# Patient Record
Sex: Female | Born: 1935 | Race: White | Hispanic: No | Smoking: Never smoker
Health system: Southern US, Community
[De-identification: ages and names within clinical notes are randomized; demographics above are authoritative.]

## PROBLEM LIST (undated history)

## (undated) DIAGNOSIS — M1990 Unspecified osteoarthritis, unspecified site: Secondary | ICD-10-CM

## (undated) DIAGNOSIS — I639 Cerebral infarction, unspecified: Secondary | ICD-10-CM

## (undated) DIAGNOSIS — I10 Essential (primary) hypertension: Secondary | ICD-10-CM

## (undated) DIAGNOSIS — G459 Transient cerebral ischemic attack, unspecified: Secondary | ICD-10-CM

## (undated) DIAGNOSIS — C801 Malignant (primary) neoplasm, unspecified: Secondary | ICD-10-CM

## (undated) DIAGNOSIS — J449 Chronic obstructive pulmonary disease, unspecified: Secondary | ICD-10-CM

## (undated) DIAGNOSIS — K5792 Diverticulitis of intestine, part unspecified, without perforation or abscess without bleeding: Secondary | ICD-10-CM

## (undated) DIAGNOSIS — I2510 Atherosclerotic heart disease of native coronary artery without angina pectoris: Secondary | ICD-10-CM

## (undated) HISTORY — PX: CAROTID STENT: SHX1301

## (undated) HISTORY — PX: ABDOMINAL HYSTERECTOMY: SHX81

## (undated) HISTORY — PX: NASAL SINUS SURGERY: SHX719

## (undated) HISTORY — PX: TONSILLECTOMY: SUR1361

## (undated) HISTORY — PX: REPLACEMENT TOTAL KNEE: SUR1224

## (undated) HISTORY — PX: OVARIAN CYST REMOVAL: SHX89

## (undated) HISTORY — PX: CHOLECYSTECTOMY: SHX55

## (undated) HISTORY — PX: DILATION AND CURETTAGE OF UTERUS: SHX78

## (undated) SURGERY — RIGHT HEART CATH
Anesthesia: Moderate Sedation | Laterality: Right

---

## 2003-07-20 ENCOUNTER — Other Ambulatory Visit: Payer: Self-pay

## 2003-08-31 ENCOUNTER — Other Ambulatory Visit: Payer: Self-pay

## 2003-09-02 ENCOUNTER — Other Ambulatory Visit: Payer: Self-pay

## 2003-09-07 ENCOUNTER — Other Ambulatory Visit: Payer: Self-pay

## 2003-09-09 ENCOUNTER — Other Ambulatory Visit: Payer: Self-pay

## 2003-10-01 ENCOUNTER — Other Ambulatory Visit: Payer: Self-pay

## 2004-03-15 ENCOUNTER — Other Ambulatory Visit: Payer: Self-pay

## 2004-03-27 ENCOUNTER — Inpatient Hospital Stay: Payer: Self-pay | Admitting: Unknown Physician Specialty

## 2004-04-04 ENCOUNTER — Encounter: Payer: Self-pay | Admitting: Internal Medicine

## 2004-04-11 ENCOUNTER — Ambulatory Visit: Payer: Self-pay | Admitting: Internal Medicine

## 2004-05-04 ENCOUNTER — Inpatient Hospital Stay: Payer: Self-pay | Admitting: Internal Medicine

## 2004-05-08 ENCOUNTER — Encounter: Payer: Self-pay | Admitting: Internal Medicine

## 2004-05-09 ENCOUNTER — Emergency Department: Payer: Self-pay | Admitting: Emergency Medicine

## 2004-05-31 ENCOUNTER — Encounter: Payer: Self-pay | Admitting: Internal Medicine

## 2004-12-18 ENCOUNTER — Ambulatory Visit: Payer: Self-pay | Admitting: Unknown Physician Specialty

## 2005-03-12 ENCOUNTER — Ambulatory Visit: Payer: Self-pay | Admitting: Unknown Physician Specialty

## 2005-04-10 ENCOUNTER — Ambulatory Visit: Payer: Self-pay | Admitting: Pain Medicine

## 2005-04-18 ENCOUNTER — Ambulatory Visit: Payer: Self-pay | Admitting: Pain Medicine

## 2005-05-29 ENCOUNTER — Ambulatory Visit: Payer: Self-pay | Admitting: Pain Medicine

## 2005-06-06 ENCOUNTER — Ambulatory Visit: Payer: Self-pay | Admitting: Pain Medicine

## 2005-06-12 ENCOUNTER — Ambulatory Visit: Payer: Self-pay

## 2005-06-26 ENCOUNTER — Ambulatory Visit: Payer: Self-pay | Admitting: Pain Medicine

## 2005-07-02 ENCOUNTER — Ambulatory Visit: Payer: Self-pay | Admitting: Pain Medicine

## 2005-07-19 ENCOUNTER — Ambulatory Visit: Payer: Self-pay | Admitting: Pain Medicine

## 2005-07-25 ENCOUNTER — Ambulatory Visit: Payer: Self-pay | Admitting: Pain Medicine

## 2005-08-21 ENCOUNTER — Ambulatory Visit: Payer: Self-pay | Admitting: Specialist

## 2005-09-06 ENCOUNTER — Ambulatory Visit: Payer: Self-pay | Admitting: Unknown Physician Specialty

## 2005-09-17 ENCOUNTER — Ambulatory Visit: Payer: Self-pay | Admitting: Pain Medicine

## 2005-10-11 ENCOUNTER — Ambulatory Visit: Payer: Self-pay | Admitting: Pain Medicine

## 2005-11-15 ENCOUNTER — Ambulatory Visit: Payer: Self-pay | Admitting: Pain Medicine

## 2005-11-21 ENCOUNTER — Ambulatory Visit: Payer: Self-pay | Admitting: Pain Medicine

## 2005-12-18 ENCOUNTER — Ambulatory Visit: Payer: Self-pay | Admitting: Pain Medicine

## 2006-02-05 ENCOUNTER — Ambulatory Visit: Payer: Self-pay | Admitting: Pain Medicine

## 2006-03-26 ENCOUNTER — Ambulatory Visit: Payer: Self-pay | Admitting: Pain Medicine

## 2006-04-03 ENCOUNTER — Ambulatory Visit: Payer: Self-pay | Admitting: Pain Medicine

## 2006-04-10 ENCOUNTER — Ambulatory Visit: Payer: Self-pay | Admitting: Pain Medicine

## 2006-05-14 ENCOUNTER — Ambulatory Visit: Payer: Self-pay | Admitting: Pain Medicine

## 2006-05-24 ENCOUNTER — Ambulatory Visit: Payer: Self-pay

## 2006-05-29 ENCOUNTER — Ambulatory Visit: Payer: Self-pay | Admitting: Pain Medicine

## 2006-06-13 ENCOUNTER — Ambulatory Visit: Payer: Self-pay | Admitting: Pain Medicine

## 2006-07-11 ENCOUNTER — Ambulatory Visit: Payer: Self-pay | Admitting: Pain Medicine

## 2006-08-08 ENCOUNTER — Ambulatory Visit: Payer: Self-pay | Admitting: Pain Medicine

## 2006-08-21 ENCOUNTER — Ambulatory Visit: Payer: Self-pay | Admitting: Pain Medicine

## 2006-09-05 ENCOUNTER — Ambulatory Visit: Payer: Self-pay | Admitting: Pain Medicine

## 2006-09-16 ENCOUNTER — Ambulatory Visit: Payer: Self-pay | Admitting: Pain Medicine

## 2006-10-17 ENCOUNTER — Ambulatory Visit: Payer: Self-pay | Admitting: Pain Medicine

## 2006-10-28 ENCOUNTER — Ambulatory Visit: Payer: Self-pay | Admitting: Pain Medicine

## 2006-10-31 ENCOUNTER — Ambulatory Visit: Payer: Self-pay | Admitting: Unknown Physician Specialty

## 2006-11-07 ENCOUNTER — Ambulatory Visit: Payer: Self-pay | Admitting: Pain Medicine

## 2006-11-18 ENCOUNTER — Ambulatory Visit: Payer: Self-pay | Admitting: Pain Medicine

## 2006-12-24 ENCOUNTER — Ambulatory Visit: Payer: Self-pay | Admitting: Pain Medicine

## 2007-01-22 ENCOUNTER — Ambulatory Visit: Payer: Self-pay | Admitting: Pain Medicine

## 2007-02-05 ENCOUNTER — Ambulatory Visit: Payer: Self-pay | Admitting: Pain Medicine

## 2007-02-20 ENCOUNTER — Ambulatory Visit: Payer: Self-pay | Admitting: Pain Medicine

## 2007-03-18 ENCOUNTER — Ambulatory Visit: Payer: Self-pay | Admitting: Pain Medicine

## 2007-04-02 ENCOUNTER — Ambulatory Visit: Payer: Self-pay | Admitting: Pain Medicine

## 2007-05-06 ENCOUNTER — Ambulatory Visit: Payer: Self-pay | Admitting: Pain Medicine

## 2007-05-07 ENCOUNTER — Encounter: Admission: RE | Admit: 2007-05-07 | Discharge: 2007-05-07 | Payer: Self-pay | Admitting: Neurology

## 2007-05-21 ENCOUNTER — Ambulatory Visit: Payer: Self-pay | Admitting: Pain Medicine

## 2007-05-26 ENCOUNTER — Other Ambulatory Visit: Payer: Self-pay

## 2007-05-26 ENCOUNTER — Emergency Department: Payer: Self-pay | Admitting: Internal Medicine

## 2007-06-05 ENCOUNTER — Ambulatory Visit: Payer: Self-pay | Admitting: Pain Medicine

## 2007-07-04 ENCOUNTER — Ambulatory Visit: Payer: Self-pay | Admitting: Pain Medicine

## 2007-08-27 ENCOUNTER — Ambulatory Visit: Payer: Self-pay | Admitting: Pain Medicine

## 2007-09-18 ENCOUNTER — Ambulatory Visit: Payer: Self-pay | Admitting: Pain Medicine

## 2007-10-20 ENCOUNTER — Ambulatory Visit: Payer: Self-pay | Admitting: Pain Medicine

## 2007-11-11 ENCOUNTER — Ambulatory Visit: Payer: Self-pay | Admitting: Pain Medicine

## 2007-11-18 ENCOUNTER — Ambulatory Visit: Payer: Self-pay | Admitting: Unknown Physician Specialty

## 2007-11-19 ENCOUNTER — Ambulatory Visit: Payer: Self-pay | Admitting: Pain Medicine

## 2007-11-20 ENCOUNTER — Other Ambulatory Visit: Payer: Self-pay

## 2007-11-20 ENCOUNTER — Inpatient Hospital Stay: Payer: Self-pay | Admitting: Internal Medicine

## 2007-12-16 ENCOUNTER — Ambulatory Visit: Payer: Self-pay | Admitting: Pain Medicine

## 2007-12-31 ENCOUNTER — Ambulatory Visit: Payer: Self-pay | Admitting: Pain Medicine

## 2008-01-22 ENCOUNTER — Ambulatory Visit: Payer: Self-pay | Admitting: Pain Medicine

## 2008-02-23 ENCOUNTER — Ambulatory Visit: Payer: Self-pay | Admitting: Pain Medicine

## 2008-03-18 ENCOUNTER — Ambulatory Visit: Payer: Self-pay | Admitting: Pain Medicine

## 2008-04-15 ENCOUNTER — Ambulatory Visit: Payer: Self-pay | Admitting: Pain Medicine

## 2008-05-26 ENCOUNTER — Ambulatory Visit: Payer: Self-pay | Admitting: Pain Medicine

## 2008-06-23 ENCOUNTER — Ambulatory Visit: Payer: Self-pay | Admitting: Pain Medicine

## 2008-07-20 ENCOUNTER — Ambulatory Visit: Payer: Self-pay | Admitting: Pain Medicine

## 2008-08-12 ENCOUNTER — Ambulatory Visit: Payer: Self-pay | Admitting: Pain Medicine

## 2008-08-18 ENCOUNTER — Ambulatory Visit: Payer: Self-pay | Admitting: Pain Medicine

## 2008-09-09 ENCOUNTER — Ambulatory Visit: Payer: Self-pay | Admitting: Pain Medicine

## 2008-09-13 ENCOUNTER — Ambulatory Visit: Payer: Self-pay | Admitting: Pain Medicine

## 2008-10-19 ENCOUNTER — Ambulatory Visit: Payer: Self-pay | Admitting: Pain Medicine

## 2008-10-25 ENCOUNTER — Ambulatory Visit: Payer: Self-pay | Admitting: Pain Medicine

## 2008-11-16 ENCOUNTER — Ambulatory Visit: Payer: Self-pay | Admitting: Pain Medicine

## 2008-12-28 ENCOUNTER — Ambulatory Visit: Payer: Self-pay | Admitting: Pain Medicine

## 2009-01-26 ENCOUNTER — Ambulatory Visit: Payer: Self-pay | Admitting: Pain Medicine

## 2009-03-02 ENCOUNTER — Ambulatory Visit: Payer: Self-pay | Admitting: Pain Medicine

## 2009-03-30 ENCOUNTER — Ambulatory Visit: Payer: Self-pay | Admitting: Pain Medicine

## 2009-06-02 ENCOUNTER — Ambulatory Visit: Payer: Self-pay | Admitting: Pain Medicine

## 2009-07-12 ENCOUNTER — Ambulatory Visit: Payer: Self-pay | Admitting: Pain Medicine

## 2009-08-15 ENCOUNTER — Ambulatory Visit: Payer: Self-pay | Admitting: Pain Medicine

## 2009-09-27 ENCOUNTER — Ambulatory Visit: Payer: Self-pay | Admitting: Pain Medicine

## 2009-11-22 ENCOUNTER — Ambulatory Visit: Payer: Self-pay | Admitting: Pain Medicine

## 2009-11-23 ENCOUNTER — Ambulatory Visit: Payer: Self-pay | Admitting: Otolaryngology

## 2010-01-18 ENCOUNTER — Ambulatory Visit: Payer: Self-pay | Admitting: Pain Medicine

## 2010-02-28 ENCOUNTER — Ambulatory Visit: Payer: Self-pay | Admitting: Pain Medicine

## 2010-03-12 ENCOUNTER — Emergency Department: Payer: Self-pay | Admitting: Unknown Physician Specialty

## 2010-09-18 ENCOUNTER — Ambulatory Visit: Payer: Self-pay | Admitting: Unknown Physician Specialty

## 2012-06-18 ENCOUNTER — Telehealth: Payer: Self-pay | Admitting: *Deleted

## 2012-06-18 NOTE — Telephone Encounter (Signed)
Refill Request   Metoprolol tartrate 10 m  60 tab  Take 1 tablet twice a day

## 2012-06-18 NOTE — Telephone Encounter (Signed)
I have not ever seen this pt and she has no appt scheduled.  Notify pharmacy to send refill request to her pcp

## 2012-06-20 NOTE — Telephone Encounter (Signed)
I called and spoke with Page at [facility] and advised her patient hadn't established care with Dr. Lorin Picket and this needed to be sent to her PCP. Page asked was she transferring care from Dr. Sampson Goon to Dr. Lorin Picket, I advised Page we had no record on patient to please forward to Dr. Jarrett Ables office.

## 2012-09-12 ENCOUNTER — Telehealth: Payer: Self-pay | Admitting: *Deleted

## 2012-09-12 NOTE — Telephone Encounter (Signed)
Refill Request  Diovan 320 mg  #15  Take 1 tablet by mouth once a day

## 2012-09-12 NOTE — Telephone Encounter (Signed)
Phoned [facility] and advised this pt has not been seen here and to forward request to her PCP. See previous refill note per Dr. Lorin Picket.

## 2013-03-23 ENCOUNTER — Ambulatory Visit: Payer: Self-pay | Admitting: Otolaryngology

## 2013-03-23 LAB — BASIC METABOLIC PANEL
BUN: 22 mg/dL — ABNORMAL HIGH (ref 7–18)
Calcium, Total: 9.1 mg/dL (ref 8.5–10.1)
Co2: 30 mmol/L (ref 21–32)
Creatinine: 0.97 mg/dL (ref 0.60–1.30)
Glucose: 104 mg/dL — ABNORMAL HIGH (ref 65–99)
Osmolality: 266 (ref 275–301)
Potassium: 4.3 mmol/L (ref 3.5–5.1)
Sodium: 131 mmol/L — ABNORMAL LOW (ref 136–145)

## 2013-03-23 LAB — CBC WITH DIFFERENTIAL/PLATELET
Basophil %: 0.8 %
HCT: 41 % (ref 35.0–47.0)
MCHC: 33.7 g/dL (ref 32.0–36.0)
MCV: 85 fL (ref 80–100)
Monocyte #: 0.6 x10 3/mm (ref 0.2–0.9)
Neutrophil #: 5.4 10*3/uL (ref 1.4–6.5)
RDW: 15 % — ABNORMAL HIGH (ref 11.5–14.5)
WBC: 9.3 10*3/uL (ref 3.6–11.0)

## 2013-03-30 ENCOUNTER — Ambulatory Visit: Payer: Self-pay | Admitting: Otolaryngology

## 2013-04-01 LAB — PATHOLOGY REPORT

## 2013-04-20 LAB — CULTURE, FUNGUS WITHOUT SMEAR

## 2014-08-20 NOTE — Op Note (Signed)
PATIENT NAME:  Lisa Haney, Lisa Haney MR#:  [id number] DATE OF BIRTH:  [birth year]  DATE OF PROCEDURE:  03/30/2013  PREOPERATIVE DIAGNOSES: Chronic sinusitis.   POSTOPERATIVE DIAGNOSIS: Chronic sinusitis.   PROCEDURE PERFORMED:  1.  Left sphenoidotomy.  2.  Left total ethmoidectomy.  3.  Left maxillary antrostomy with tissue removal.  4.  Right maxillary antrostomy with tissue removal.  5.  Image guided sinus surgery.   SURGEON: Carloyn Manner, M.D.   ANESTHESIA: General endotracheal anesthesia.   ESTIMATED BLOOD LOSS: 250 mL.   IV FLUIDS: Please see anesthesia record.   COMPLICATIONS: None.   DRAINS/STENT PLACEMENTS: Stammberger Sinu-Foam and XeroGel.   SPECIMENS: Left sinus contents sent for pathological evaluation as well as fungal and aerobic culture.   INDICATIONS FOR PROCEDURE: The patient is an 81 year old female with history of chronic sinusitis, hypertension with long-standing history of sinusitis resistant to medical management. The patient was taken to the operating room for endoscopic sinus surgery.   OPERATIVE FINDINGS: Significant osteitic bone of the left anterior and posterior ethmoid as well as the left sphenoid with a significant amount of debris filling the left sphenoid, posterior ethmoid and purulent drainage in the bilateral maxillary sinuses.   DESCRIPTION OF PROCEDURE: The patient was identified in holding, the benefits and risks of the procedure were discussed and consent was reviewed. The patient was taken to the operating room and placed in the supine position. General endotracheal anesthesia was induced. The patient was rotated 45 degrees. A Stryker image guided sinus system was set up in the normal fashion and calibrated with acceptable error of 0.9 mm. This was referred to throughout the duration of the case to ensure proper positioning of the patient. At this time, the patient was prepped and draped in a sterile fashion. A 0 degree endoscope was brought into  the field. This demonstrated erythematous mucosa lateral to the middle turbinates. The middle turbinates were medialized with a freer elevator Afrin-soaked pledgets were placed for hemostasis. The uncinate process on the right side was brought forward with the ball-tip probe and then a wide maxillary antrostomy was created with a ball-tipped probe and then tissue was removed from the maxillary sinus with the [facility] microdebrider creating a large maxillary antrostomy on the right side.   Purulent drainage and erythematous mucosa was noted in the right maxillary sinus. At this time, hemostasis was achieved using Afrin-soaked pledgets and attention was directed to the patient's left nostril. In a similar fashion, the uncinate process was brought forward using a ball-tipped probe and the maxillary sinus natural os was entered with ball-tipped probe, and then this was brought inferiorly for enlargement of the os. A Diego microdebrider was used to connect the surgical os and the natural os for a wide maxillary antrostomy on the left side. Attention at this time was directed to the patient's ethmoids. The ethmoid bulla was entered using a J-tipped curette given the osteitic nature of it. Then this was debrided with the [facility] microdebrider and a J curette from a medial and inferior position more superior and lateral. This demonstrated erythematous mucosa and thickened secretions and debris. The posterior ethmoid air cells were entered with a J curette and a similar amount of yellowish debris filling the posterior ethmoids was removed. At this time, the left sphenoid was entered. The sphenoid os was enlarged and again, severe osteitic bone was noted and significant amount of debris completely filling the sphenoid sinus was removed. At this time, the patient's bilateral nasal cavities were copiously  irrigated with sterile saline until all debris was completely removed. Stammberger Sinu-Foam was placed within the sphenoid  sinus as well as the maxillary sinus and left ethmoid and right maxillary sinus and then XeroGel was placed lateral to the middle turbinates for medialization and this was inflated with sterile saline. At this time, care of the patient was transferred to anesthesia.   ____________________________ Jerene Bears, MD ccv:aw D: 03/30/2013 08:32:21 ET T: 03/30/2013 08:47:17 ET JOB#: [id number]  cc: Jerene Bears, MD, <Dictator> Jerene Bears MD ELECTRONICALLY SIGNED 04/06/2013 10:31

## 2015-10-17 ENCOUNTER — Other Ambulatory Visit: Payer: Self-pay | Admitting: Student

## 2015-10-17 DIAGNOSIS — R1084 Generalized abdominal pain: Secondary | ICD-10-CM

## 2015-10-19 ENCOUNTER — Other Ambulatory Visit: Payer: Self-pay | Admitting: Student

## 2015-10-19 ENCOUNTER — Ambulatory Visit
Admission: RE | Admit: 2015-10-19 | Discharge: 2015-10-19 | Disposition: A | Discharge status: Home / Self Care | Payer: Medicare Other | Source: Ambulatory Visit | Attending: Student | Admitting: Student

## 2015-10-19 DIAGNOSIS — R1084 Generalized abdominal pain: Secondary | ICD-10-CM | POA: Diagnosis present

## 2015-10-19 HISTORY — DX: Malignant (primary) neoplasm, unspecified: C80.1

## 2015-10-19 HISTORY — DX: Essential (primary) hypertension: I10

## 2015-10-19 MED ORDER — IOPAMIDOL (ISOVUE-300) INJECTION 61%
80.0000 mL | Freq: Once | INTRAVENOUS | Status: AC | PRN
  Administered 2015-10-19: 80 mL via INTRAVENOUS

## 2016-01-26 ENCOUNTER — Other Ambulatory Visit: Payer: Self-pay | Admitting: Internal Medicine

## 2016-01-26 DIAGNOSIS — G8929 Other chronic pain: Secondary | ICD-10-CM

## 2016-01-26 DIAGNOSIS — M5441 Lumbago with sciatica, right side: Secondary | ICD-10-CM

## 2016-01-26 DIAGNOSIS — M5137 Other intervertebral disc degeneration, lumbosacral region: Secondary | ICD-10-CM

## 2016-01-26 DIAGNOSIS — M5442 Lumbago with sciatica, left side: Secondary | ICD-10-CM

## 2016-02-03 ENCOUNTER — Ambulatory Visit
Admission: RE | Admit: 2016-02-03 | Discharge: 2016-02-03 | Disposition: A | Discharge status: Home / Self Care | Payer: Medicare Other | Source: Ambulatory Visit | Attending: Infectious Diseases | Admitting: Infectious Diseases

## 2016-02-03 ENCOUNTER — Ambulatory Visit
Admission: RE | Admit: 2016-02-03 | Discharge: 2016-02-03 | Disposition: A | Discharge status: Home / Self Care | Payer: Medicare Other | Source: Ambulatory Visit | Attending: Internal Medicine | Admitting: Internal Medicine

## 2016-02-03 DIAGNOSIS — I517 Cardiomegaly: Secondary | ICD-10-CM | POA: Insufficient documentation

## 2016-02-03 DIAGNOSIS — I700 Atherosclerosis of aorta: Secondary | ICD-10-CM | POA: Insufficient documentation

## 2016-02-03 DIAGNOSIS — Z452 Encounter for adjustment and management of vascular access device: Secondary | ICD-10-CM | POA: Diagnosis not present

## 2016-02-03 DIAGNOSIS — R7881 Bacteremia: Secondary | ICD-10-CM | POA: Diagnosis not present

## 2016-02-03 MED ORDER — VANCOMYCIN HCL IN DEXTROSE 1-5 GM/200ML-% IV SOLN
1000.0000 mg | Freq: Once | INTRAVENOUS | Status: AC
  Administered 2016-02-03: 1000 mg via INTRAVENOUS

## 2016-02-03 MED ORDER — VANCOMYCIN HCL IN DEXTROSE 1-5 GM/200ML-% IV SOLN
INTRAVENOUS | Status: AC
  Filled 2016-02-03: qty 200

## 2016-02-03 MED ORDER — SODIUM CHLORIDE FLUSH 0.9 % IV SOLN
INTRAVENOUS | Status: AC
  Administered 2016-02-03: 18:00:00
  Filled 2016-02-03: qty 20

## 2016-02-03 NOTE — Progress Notes (Signed)
Dr. Ola Spurr paged x 2 to obtain picc line flushing instructions. No return call. Picc line flushed with sodium chloride flush.

## 2016-02-08 ENCOUNTER — Ambulatory Visit: Payer: Medicare Other

## 2016-02-12 ENCOUNTER — Inpatient Hospital Stay
Admission: EM | Admit: 2016-02-12 | Discharge: 2016-02-15 | DRG: 190 | Disposition: A | Discharge status: Home Health | Payer: Medicare Other | Attending: Internal Medicine | Admitting: Internal Medicine

## 2016-02-12 ENCOUNTER — Emergency Department: Payer: Medicare Other

## 2016-02-12 ENCOUNTER — Encounter: Payer: Self-pay | Admitting: Emergency Medicine

## 2016-02-12 DIAGNOSIS — J328 Other chronic sinusitis: Secondary | ICD-10-CM | POA: Diagnosis present

## 2016-02-12 DIAGNOSIS — I110 Hypertensive heart disease with heart failure: Secondary | ICD-10-CM | POA: Diagnosis present

## 2016-02-12 DIAGNOSIS — R601 Generalized edema: Secondary | ICD-10-CM

## 2016-02-12 DIAGNOSIS — Z79899 Other long term (current) drug therapy: Secondary | ICD-10-CM

## 2016-02-12 DIAGNOSIS — J441 Chronic obstructive pulmonary disease with (acute) exacerbation: Principal | ICD-10-CM | POA: Diagnosis present

## 2016-02-12 DIAGNOSIS — Y712 Prosthetic and other implants, materials and accessory cardiovascular devices associated with adverse incidents: Secondary | ICD-10-CM | POA: Diagnosis present

## 2016-02-12 DIAGNOSIS — R0902 Hypoxemia: Secondary | ICD-10-CM

## 2016-02-12 DIAGNOSIS — I472 Ventricular tachycardia: Secondary | ICD-10-CM | POA: Diagnosis not present

## 2016-02-12 DIAGNOSIS — J449 Chronic obstructive pulmonary disease, unspecified: Secondary | ICD-10-CM

## 2016-02-12 DIAGNOSIS — Z6841 Body Mass Index (BMI) 40.0 and over, adult: Secondary | ICD-10-CM

## 2016-02-12 DIAGNOSIS — J9621 Acute and chronic respiratory failure with hypoxia: Secondary | ICD-10-CM | POA: Diagnosis present

## 2016-02-12 DIAGNOSIS — E785 Hyperlipidemia, unspecified: Secondary | ICD-10-CM | POA: Diagnosis present

## 2016-02-12 DIAGNOSIS — B9562 Methicillin resistant Staphylococcus aureus infection as the cause of diseases classified elsewhere: Secondary | ICD-10-CM | POA: Diagnosis present

## 2016-02-12 DIAGNOSIS — R0602 Shortness of breath: Secondary | ICD-10-CM | POA: Diagnosis not present

## 2016-02-12 DIAGNOSIS — I5033 Acute on chronic diastolic (congestive) heart failure: Secondary | ICD-10-CM | POA: Diagnosis present

## 2016-02-12 DIAGNOSIS — Z8673 Personal history of transient ischemic attack (TIA), and cerebral infarction without residual deficits: Secondary | ICD-10-CM

## 2016-02-12 DIAGNOSIS — E039 Hypothyroidism, unspecified: Secondary | ICD-10-CM | POA: Diagnosis present

## 2016-02-12 DIAGNOSIS — Z87891 Personal history of nicotine dependence: Secondary | ICD-10-CM

## 2016-02-12 DIAGNOSIS — Z95828 Presence of other vascular implants and grafts: Secondary | ICD-10-CM

## 2016-02-12 DIAGNOSIS — Z9981 Dependence on supplemental oxygen: Secondary | ICD-10-CM

## 2016-02-12 DIAGNOSIS — Z9049 Acquired absence of other specified parts of digestive tract: Secondary | ICD-10-CM

## 2016-02-12 DIAGNOSIS — R262 Difficulty in walking, not elsewhere classified: Secondary | ICD-10-CM

## 2016-02-12 DIAGNOSIS — D72829 Elevated white blood cell count, unspecified: Secondary | ICD-10-CM

## 2016-02-12 DIAGNOSIS — T82524A Displacement of infusion catheter, initial encounter: Secondary | ICD-10-CM | POA: Diagnosis present

## 2016-02-12 DIAGNOSIS — M6281 Muscle weakness (generalized): Secondary | ICD-10-CM

## 2016-02-12 DIAGNOSIS — Z96651 Presence of right artificial knee joint: Secondary | ICD-10-CM | POA: Diagnosis present

## 2016-02-12 DIAGNOSIS — Z452 Encounter for adjustment and management of vascular access device: Secondary | ICD-10-CM

## 2016-02-12 HISTORY — DX: Diverticulitis of intestine, part unspecified, without perforation or abscess without bleeding: K57.92

## 2016-02-12 HISTORY — DX: Transient cerebral ischemic attack, unspecified: G45.9

## 2016-02-12 HISTORY — DX: Chronic obstructive pulmonary disease, unspecified: J44.9

## 2016-02-12 MED ORDER — ACETAMINOPHEN 325 MG PO TABS
650.0000 mg | ORAL_TABLET | Freq: Once | ORAL | Status: AC
  Administered 2016-02-12: 650 mg via ORAL
  Filled 2016-02-12: qty 2

## 2016-02-12 MED ORDER — IPRATROPIUM-ALBUTEROL 0.5-2.5 (3) MG/3ML IN SOLN
RESPIRATORY_TRACT | Status: AC
  Filled 2016-02-12: qty 3

## 2016-02-12 MED ORDER — METHYLPREDNISOLONE SODIUM SUCC 125 MG IJ SOLR
125.0000 mg | Freq: Once | INTRAMUSCULAR | Status: AC
  Administered 2016-02-13: 125 mg via INTRAVENOUS
  Filled 2016-02-12: qty 2

## 2016-02-12 MED ORDER — VANCOMYCIN HCL IN DEXTROSE 1-5 GM/200ML-% IV SOLN
1000.0000 mg | Freq: Once | INTRAVENOUS | Status: AC
  Administered 2016-02-12: 1000 mg via INTRAVENOUS
  Filled 2016-02-12: qty 200

## 2016-02-12 MED ORDER — PRAVASTATIN SODIUM 40 MG PO TABS
40.0000 mg | ORAL_TABLET | Freq: Once | ORAL | Status: AC
  Administered 2016-02-13: 40 mg via ORAL
  Filled 2016-02-12: qty 1

## 2016-02-12 MED ORDER — ASPIRIN EC 81 MG PO TBEC
81.0000 mg | DELAYED_RELEASE_TABLET | Freq: Once | ORAL | Status: AC
  Administered 2016-02-12: 81 mg via ORAL
  Filled 2016-02-12: qty 1

## 2016-02-12 MED ORDER — ISOSORBIDE DINITRATE 20 MG PO TABS
20.0000 mg | ORAL_TABLET | Freq: Once | ORAL | Status: AC
  Administered 2016-02-13: 20 mg via ORAL
  Filled 2016-02-12: qty 1

## 2016-02-12 MED ORDER — TRAMADOL HCL 50 MG PO TABS
50.0000 mg | ORAL_TABLET | Freq: Once | ORAL | Status: AC
  Administered 2016-02-12: 50 mg via ORAL
  Filled 2016-02-12: qty 1

## 2016-02-12 MED ORDER — CLONIDINE HCL 0.1 MG PO TABS
0.2000 mg | ORAL_TABLET | Freq: Once | ORAL | Status: AC
  Administered 2016-02-12: 0.2 mg via ORAL
  Filled 2016-02-12: qty 2

## 2016-02-12 MED ORDER — METOPROLOL TARTRATE 50 MG PO TABS
100.0000 mg | ORAL_TABLET | Freq: Once | ORAL | Status: AC
  Administered 2016-02-12: 100 mg via ORAL
  Filled 2016-02-12: qty 2

## 2016-02-12 MED ORDER — IPRATROPIUM-ALBUTEROL 0.5-2.5 (3) MG/3ML IN SOLN
3.0000 mL | Freq: Once | RESPIRATORY_TRACT | Status: AC
  Administered 2016-02-12: 3 mL via RESPIRATORY_TRACT

## 2016-02-12 NOTE — ED Notes (Signed)
Report given to Laurie, RN.

## 2016-02-12 NOTE — ED Triage Notes (Addendum)
Pt presents with PICC line to right upper arm that was placed her at [facility] about 1 week ago; pt says around 3pm today, when she was supposed to have her afternoon antibiotics, she noticed the line hanging out; pt c/o general pain to upper arm since line placed, no new or increased pain; pt with history of COPD but denies any worsening shortness of breath since line came out; tip of catheter does not appear to be intact; markings on line to indicate placement show one line present at the very end of the catheter with a straight cut mark across the end of the tube;

## 2016-02-12 NOTE — ED Notes (Signed)
Spoke with Dr Kerman Passey, verbal order for chest xray

## 2016-02-12 NOTE — ED Provider Notes (Signed)
-----------------------------------------   11:53 PM on 02/12/2016 -----------------------------------------  Called to patient's bedside who is experiencing shortness of breath. On 2L oxygen sats are 85%. Diffuse wheezing on auscultation. Will initiate IV solumedrol, duoneb, check EKG, basic labs.   ----------------------------------------- 12:29 AM on 02/13/2016 -----------------------------------------  Wheezing improved. Patient is currently on her own CPAP; sats 88%. Will add VBG.  ED ECG REPORT I, Capria Cartaya J, the attending physician, personally viewed and interpreted this ECG.   Date: 02/13/2016  EKG Time: 0019  Rate: 89  Rhythm: normal EKG, normal sinus rhythm  Axis: Normal  Intervals:none  ST&T Change: Nonspecific  ----------------------------------------- 2:18 AM on 02/13/2016 -----------------------------------------  Oxygen increased to 4 L, saturations 92%. Slight wheezing returned. Will administer second DuoNeb. Will discuss with hospitalist to evaluate patient in the emergency department for admission.   Paulette Blanch, MD 02/13/16 [phone]

## 2016-02-12 NOTE — ED Notes (Signed)
Dr. Paduchowski at bedside.  

## 2016-02-12 NOTE — ED Provider Notes (Signed)
[facility] Emergency Department Provider Note  Time seen: 9:57 PM  I have reviewed the triage vital signs and the nursing notes.   HISTORY  Chief Complaint Vascular Access Problem    HPI Lisa Haney is a 82 y.o. female presents to the emergency department for PICC line displacement. According to the patient she does not know what happened but she noticed that her PICC line is pulled out of her right arm. Patient is currently on PICC line antibiotics for daily vancomycin due to recurrent ear and sinus infections that have cultured positive for MRSA. Dr. Ola Spurr is the patient's treating physician. A PICC line was placed 02/03/16. Patient did not receive her daily dose of vancomycin today. Patient has no complaints besides hoping to get the PICC line replaced. No medical complaints at this time.  Past Medical History:  Diagnosis Date  . Cancer ([place])   . COPD (chronic obstructive pulmonary disease) ([place])   . Diverticulitis   . Hypertension   . TIA (transient ischemic attack)     There are no active problems to display for this patient.   Past Surgical History:  Procedure Laterality Date  . ABDOMINAL HYSTERECTOMY    . CAROTID STENT    . CHOLECYSTECTOMY    . DILATION AND CURETTAGE OF UTERUS    . NASAL SINUS SURGERY    . OVARIAN CYST REMOVAL    . REPLACEMENT TOTAL KNEE Right   . TONSILLECTOMY      Prior to Admission medications   Medication Sig Start Date End Date Taking? Authorizing Provider  acetaminophen (TYLENOL) 650 MG CR tablet Take 650 mg by mouth every 8 (eight) hours as needed for pain.    Historical Provider, MD  albuterol (PROVENTIL HFA;VENTOLIN HFA) 108 (90 Base) MCG/ACT inhaler Inhale 2 puffs into the lungs every 6 (six) hours as needed for wheezing or shortness of breath.    Historical Provider, MD  amLODipine (NORVASC) 5 MG tablet Take 5 mg by mouth daily.    Historical Provider, MD  Cholecalciferol 1000 UNIT/10ML LIQD Take 1,000  Units by mouth daily.    Historical Provider, MD  cloNIDine (CATAPRES) 0.2 MG tablet Take 0.2 mg by mouth daily.    Historical Provider, MD  cyclobenzaprine (FLEXERIL) 5 MG tablet Take 5 mg by mouth daily.    Historical Provider, MD  furosemide (LASIX) 20 MG tablet Take 60 mg by mouth daily.    Historical Provider, MD  isosorbide dinitrate (ISORDIL) 20 MG tablet Take 20 mg by mouth 3 (three) times daily.    Historical Provider, MD  levothyroxine (SYNTHROID, LEVOTHROID) 150 MCG tablet Take 150 mcg by mouth daily before breakfast. Take on an empty stomach 30 to 60 minutes before breakfast    Historical Provider, MD  lisinopril (PRINIVIL,ZESTRIL) 20 MG tablet Take 20 mg by mouth daily.    Historical Provider, MD  metoprolol (LOPRESSOR) 100 MG tablet Take 100 mg by mouth daily.    Historical Provider, MD  neomycin-polymyxin-dexamethasone (MAXITROL) 0.1 % ophthalmic suspension Place into both eyes 2 (two) times daily.    Historical Provider, MD  pravastatin (PRAVACHOL) 40 MG tablet Take 40 mg by mouth at bedtime.    Historical Provider, MD  traMADol (ULTRAM) 50 MG tablet Take 50 mg by mouth every 8 (eight) hours as needed. Take one to two tablets 3 times a day as needed for pain    Historical Provider, MD  traMADol-acetaminophen (ULTRACET) 37.5-325 MG tablet Take 1 tablet by mouth every 8 (  eight) hours as needed for severe pain.    Historical Provider, MD    Allergies  Allergen Reactions  . Dioxyline Nausea Only    Severe upset stomach with pain    History reviewed. No pertinent family history.  Social History Social History  Substance Use Topics  . Smoking status: Former Research scientist (life sciences)  . Smokeless tobacco: Never Used  . Alcohol use No    Review of Systems Constitutional: Negative for fever Cardiovascular: Negative for chest pain. Respiratory: Negative for shortness of breath. Gastrointestinal: Negative for abdominal pain Neurological: Negative for headache 10-point ROS otherwise  negative.  ____________________________________________   PHYSICAL EXAM:  VITAL SIGNS: ED Triage Vitals  Enc Vitals Group     BP 02/12/16 2018 (!) 172/50     Pulse Rate 02/12/16 2018 69     Resp 02/12/16 2018 18     Temp 02/12/16 2018 97.5 F (36.4 C)     Temp Source 02/12/16 2018 Oral     SpO2 02/12/16 2018 95 %     Weight 02/12/16 2019 214 lb (97.1 kg)     Height 02/12/16 2019 5\' 1"  (1.549 m)     Head Circumference --      Peak Flow --      Pain Score 02/12/16 2021 1     Pain Loc --      Pain Edu? --      Excl. in [place]? --     Constitutional: Alert and oriented. Well appearing and in no distress. Eyes: Normal exam ENT   Head: Normocephalic and atraumatic   Mouth/Throat: Mucous membranes are moist. Cardiovascular: Normal rate, regular rhythm. No murmur Respiratory: Normal respiratory effort without tachypnea nor retractions. Breath sounds are clear Gastrointestinal: Soft and nontender. No distention. Musculoskeletal: Nontender with normal range of motion in all extremities. PICC line displacement from right upper extremity. Neurologic:  Normal speech and language. No gross focal neurologic deficits Skin:  Skin is warm, dry and intact.  Psychiatric: Mood and affect are normal.  ____________________________________________   RADIOLOGY  X-ray shows PICC line has been removed, no foreign body.  ____________________________________________   INITIAL IMPRESSION / ASSESSMENT AND PLAN / ED COURSE  Pertinent labs & imaging results that were available during my care of the patient were reviewed by me and considered in my medical decision making (see chart for details).  Patient presents the emergency department with a displaced PICC line from her right upper extremity. Patient is currently receiving daily IV vancomycin for recurrent/chronic ear and sinus infections that have cultured MRSA positive. Patient received her dose yesterday. We'll place an IV and dose the  patient's nighttime dose of IV vancomycin. I discussed the patient with vascular access, they do not have anyone available tonight to place the PICC line. They state the patient can return to the emergency department tomorrow and they could have someone come in to replace the PICC line. We will have the patient discussed this with Dr. Ola Spurr first thing in the morning to see if there is a better means for the patient to have her PICC line replaced, if no better means are available the patient will return to the emergency department tomorrow to have the PICC line replaced.  Son states that he do not know how they would get the patient back to the emergency department so early in the morning, patient states she rather just stay and have placed in the morning. Patient will stay in the emergency department overnight, we'll have her PICC line  replaced tomorrow once the PICC team arrives.  ____________________________________________   FINAL CLINICAL IMPRESSION(S) / ED DIAGNOSES  PICC line disruption/displacement    Harvest Dark, MD 02/12/16 2239

## 2016-02-12 NOTE — ED Notes (Signed)
Dr Beather Arbour at bedside to assess

## 2016-02-12 NOTE — ED Notes (Signed)
MD to bedside at this time. Pt txfr to hospital bed for comfort, pt then c/o being [facility] and her eyes noted to cross. Pt c/o HA at this time due to not having her pain pill. MD made aware of patient complaints. MD aware of patient's continued elevated BP at this time. Per MD okay to check BP q 4 hrs. Will continue to monitor.

## 2016-02-13 ENCOUNTER — Inpatient Hospital Stay: Payer: Medicare Other

## 2016-02-13 DIAGNOSIS — Z79899 Other long term (current) drug therapy: Secondary | ICD-10-CM | POA: Diagnosis not present

## 2016-02-13 DIAGNOSIS — Z9049 Acquired absence of other specified parts of digestive tract: Secondary | ICD-10-CM | POA: Diagnosis not present

## 2016-02-13 DIAGNOSIS — J9621 Acute and chronic respiratory failure with hypoxia: Secondary | ICD-10-CM | POA: Diagnosis present

## 2016-02-13 DIAGNOSIS — I472 Ventricular tachycardia: Secondary | ICD-10-CM | POA: Diagnosis not present

## 2016-02-13 DIAGNOSIS — Z8673 Personal history of transient ischemic attack (TIA), and cerebral infarction without residual deficits: Secondary | ICD-10-CM | POA: Diagnosis not present

## 2016-02-13 DIAGNOSIS — E039 Hypothyroidism, unspecified: Secondary | ICD-10-CM | POA: Diagnosis present

## 2016-02-13 DIAGNOSIS — E785 Hyperlipidemia, unspecified: Secondary | ICD-10-CM | POA: Diagnosis present

## 2016-02-13 DIAGNOSIS — Z96651 Presence of right artificial knee joint: Secondary | ICD-10-CM | POA: Diagnosis present

## 2016-02-13 DIAGNOSIS — Z6841 Body Mass Index (BMI) 40.0 and over, adult: Secondary | ICD-10-CM | POA: Diagnosis not present

## 2016-02-13 DIAGNOSIS — Y712 Prosthetic and other implants, materials and accessory cardiovascular devices associated with adverse incidents: Secondary | ICD-10-CM | POA: Diagnosis present

## 2016-02-13 DIAGNOSIS — I110 Hypertensive heart disease with heart failure: Secondary | ICD-10-CM | POA: Diagnosis present

## 2016-02-13 DIAGNOSIS — D72829 Elevated white blood cell count, unspecified: Secondary | ICD-10-CM | POA: Diagnosis present

## 2016-02-13 DIAGNOSIS — I5033 Acute on chronic diastolic (congestive) heart failure: Secondary | ICD-10-CM | POA: Diagnosis present

## 2016-02-13 DIAGNOSIS — J449 Chronic obstructive pulmonary disease, unspecified: Secondary | ICD-10-CM | POA: Diagnosis present

## 2016-02-13 DIAGNOSIS — B9562 Methicillin resistant Staphylococcus aureus infection as the cause of diseases classified elsewhere: Secondary | ICD-10-CM | POA: Diagnosis present

## 2016-02-13 DIAGNOSIS — T82524A Displacement of infusion catheter, initial encounter: Secondary | ICD-10-CM | POA: Diagnosis present

## 2016-02-13 DIAGNOSIS — J441 Chronic obstructive pulmonary disease with (acute) exacerbation: Secondary | ICD-10-CM | POA: Diagnosis present

## 2016-02-13 DIAGNOSIS — Z87891 Personal history of nicotine dependence: Secondary | ICD-10-CM | POA: Diagnosis not present

## 2016-02-13 DIAGNOSIS — Z9981 Dependence on supplemental oxygen: Secondary | ICD-10-CM | POA: Diagnosis not present

## 2016-02-13 DIAGNOSIS — J328 Other chronic sinusitis: Secondary | ICD-10-CM | POA: Diagnosis present

## 2016-02-13 DIAGNOSIS — R0602 Shortness of breath: Secondary | ICD-10-CM | POA: Diagnosis present

## 2016-02-13 LAB — BLOOD GAS, VENOUS
ACID-BASE EXCESS: 0 mmol/L (ref 0.0–2.0)
BICARBONATE: 25.4 mmol/L (ref 20.0–28.0)
O2 Saturation: 64 %
PATIENT TEMPERATURE: 37
PH VEN: 7.38 (ref 7.250–7.430)
PO2 VEN: 34 mmHg (ref 32.0–45.0)
pCO2, Ven: 43 mmHg — ABNORMAL LOW (ref 44.0–60.0)

## 2016-02-13 LAB — CBC WITH DIFFERENTIAL/PLATELET
Basophils Absolute: 0.1 10*3/uL (ref 0–0.1)
Basophils Relative: 1 %
Eosinophils Absolute: 0.2 10*3/uL (ref 0–0.7)
Eosinophils Relative: 1 %
HEMATOCRIT: 47.3 % — AB (ref 35.0–47.0)
HEMOGLOBIN: 15.8 g/dL (ref 12.0–16.0)
LYMPHS ABS: 1.4 10*3/uL (ref 1.0–3.6)
LYMPHS PCT: 8 %
MCH: 28.4 pg (ref 26.0–34.0)
MCHC: 33.4 g/dL (ref 32.0–36.0)
MCV: 85.2 fL (ref 80.0–100.0)
MONO ABS: 0.8 10*3/uL (ref 0.2–0.9)
MONOS PCT: 4 %
NEUTROS ABS: 16.3 10*3/uL — AB (ref 1.4–6.5)
Neutrophils Relative %: 86 %
Platelets: 151 10*3/uL (ref 150–440)
RBC: 5.55 MIL/uL — ABNORMAL HIGH (ref 3.80–5.20)
RDW: 15.5 % — AB (ref 11.5–14.5)
WBC: 18.9 10*3/uL — ABNORMAL HIGH (ref 3.6–11.0)

## 2016-02-13 LAB — BASIC METABOLIC PANEL
Anion gap: 8 (ref 5–15)
BUN: 16 mg/dL (ref 6–20)
CALCIUM: 8.9 mg/dL (ref 8.9–10.3)
CHLORIDE: 101 mmol/L (ref 101–111)
CO2: 25 mmol/L (ref 22–32)
CREATININE: 0.88 mg/dL (ref 0.44–1.00)
GFR calc Af Amer: >60 mL/min (ref >60)
GFR calc non Af Amer: 58 mL/min — ABNORMAL LOW (ref >60)
GLUCOSE: 135 mg/dL — AB (ref 65–99)
Potassium: 3.5 mmol/L (ref 3.5–5.1)
Sodium: 134 mmol/L — ABNORMAL LOW (ref 135–145)

## 2016-02-13 LAB — LACTIC ACID, PLASMA: Lactic Acid, Venous: 1.3 mmol/L (ref 0.5–1.9)

## 2016-02-13 LAB — MAGNESIUM: Magnesium: 1.7 mg/dL (ref 1.7–2.4)

## 2016-02-13 LAB — PHOSPHORUS: PHOSPHORUS: 2.9 mg/dL (ref 2.5–4.6)

## 2016-02-13 LAB — TROPONIN I: Troponin I: <0.03 ng/mL (ref <0.03)

## 2016-02-13 LAB — BRAIN NATRIURETIC PEPTIDE: B Natriuretic Peptide: 381 pg/mL — ABNORMAL HIGH (ref 0.0–100.0)

## 2016-02-13 LAB — VANCOMYCIN, RANDOM: VANCOMYCIN RM: 16

## 2016-02-13 LAB — MRSA PCR SCREENING: MRSA BY PCR: POSITIVE — AB

## 2016-02-13 MED ORDER — NEOMYCIN-POLYMYXIN-DEXAMETH 0.1 % OP SUSP: 1.0000 [drp] | Freq: Two times a day (BID) | OPHTHALMIC | Status: DC

## 2016-02-13 MED ORDER — IPRATROPIUM-ALBUTEROL 0.5-2.5 (3) MG/3ML IN SOLN: 3.0000 mL | RESPIRATORY_TRACT | Status: DC | PRN

## 2016-02-13 MED ORDER — IPRATROPIUM-ALBUTEROL 0.5-2.5 (3) MG/3ML IN SOLN
3.0000 mL | Freq: Once | RESPIRATORY_TRACT | Status: AC
  Administered 2016-02-13: 3 mL via RESPIRATORY_TRACT
  Filled 2016-02-13: qty 3

## 2016-02-13 MED ORDER — AMLODIPINE BESYLATE 5 MG PO TABS
5.0000 mg | ORAL_TABLET | Freq: Every day | ORAL | Status: DC
  Administered 2016-02-13 – 2016-02-15 (×3): 5 mg via ORAL
  Filled 2016-02-13 (×3): qty 1

## 2016-02-13 MED ORDER — CYCLOBENZAPRINE HCL 10 MG PO TABS
5.0000 mg | ORAL_TABLET | Freq: Every day | ORAL | Status: DC
  Administered 2016-02-13 – 2016-02-15 (×3): 5 mg via ORAL
  Filled 2016-02-13 (×3): qty 1

## 2016-02-13 MED ORDER — PIPERACILLIN-TAZOBACTAM 4.5 G IVPB
4.5000 g | Freq: Three times a day (TID) | INTRAVENOUS | Status: DC
  Administered 2016-02-13: 05:00:00 4.5 g via INTRAVENOUS
  Filled 2016-02-13 (×3): qty 100

## 2016-02-13 MED ORDER — CLONIDINE HCL 0.1 MG PO TABS
0.2000 mg | ORAL_TABLET | Freq: Every day | ORAL | Status: DC
  Administered 2016-02-13 – 2016-02-15 (×3): 0.2 mg via ORAL
  Filled 2016-02-13 (×3): qty 2

## 2016-02-13 MED ORDER — TRAMADOL HCL 50 MG PO TABS
50.0000 mg | ORAL_TABLET | Freq: Three times a day (TID) | ORAL | Status: DC | PRN
  Administered 2016-02-14: 21:00:00 50 mg via ORAL
  Filled 2016-02-13: qty 1

## 2016-02-13 MED ORDER — VANCOMYCIN HCL IN DEXTROSE 1-5 GM/200ML-% IV SOLN
1000.0000 mg | INTRAVENOUS | Status: DC
  Administered 2016-02-13 – 2016-02-14 (×2): 1000 mg via INTRAVENOUS
  Filled 2016-02-13 (×3): qty 200

## 2016-02-13 MED ORDER — METHYLPREDNISOLONE SODIUM SUCC 125 MG IJ SOLR
60.0000 mg | Freq: Four times a day (QID) | INTRAMUSCULAR | Status: DC
  Administered 2016-02-13: 05:00:00 60 mg via INTRAVENOUS
  Filled 2016-02-13: qty 2

## 2016-02-13 MED ORDER — LISINOPRIL 20 MG PO TABS
20.0000 mg | ORAL_TABLET | Freq: Every day | ORAL | Status: DC
  Administered 2016-02-13 – 2016-02-15 (×3): 20 mg via ORAL
  Filled 2016-02-13 (×3): qty 1

## 2016-02-13 MED ORDER — CHLORHEXIDINE GLUCONATE CLOTH 2 % EX PADS
6.0000 | MEDICATED_PAD | Freq: Every day | CUTANEOUS | Status: DC
  Administered 2016-02-13 – 2016-02-14 (×2): 6 via TOPICAL

## 2016-02-13 MED ORDER — SODIUM CHLORIDE 0.9 % IV SOLN
INTRAVENOUS | Status: DC
  Administered 2016-02-13: 05:00:00 via INTRAVENOUS

## 2016-02-13 MED ORDER — ENOXAPARIN SODIUM 40 MG/0.4ML ~~LOC~~ SOLN
40.0000 mg | Freq: Two times a day (BID) | SUBCUTANEOUS | Status: DC
  Administered 2016-02-13 – 2016-02-15 (×5): 40 mg via SUBCUTANEOUS
  Filled 2016-02-13 (×5): qty 0.4

## 2016-02-13 MED ORDER — CHOLECALCIFEROL 400 UNIT/ML PO LIQD
1000.0000 [IU] | Freq: Every day | ORAL | Status: DC
  Administered 2016-02-13 – 2016-02-15 (×3): 1000 [IU] via ORAL
  Filled 2016-02-13 (×4): qty 2.5

## 2016-02-13 MED ORDER — ACETAMINOPHEN 650 MG RE SUPP
650.0000 mg | Freq: Four times a day (QID) | RECTAL | Status: DC | PRN
  Filled 2016-02-13: qty 1

## 2016-02-13 MED ORDER — SODIUM CHLORIDE 0.9% FLUSH
3.0000 mL | Freq: Two times a day (BID) | INTRAVENOUS | Status: DC
  Administered 2016-02-13 – 2016-02-15 (×5): 3 mL via INTRAVENOUS

## 2016-02-13 MED ORDER — MUPIROCIN 2 % EX OINT
1.0000 "application " | TOPICAL_OINTMENT | Freq: Two times a day (BID) | CUTANEOUS | Status: DC
  Administered 2016-02-13 – 2016-02-15 (×5): 1 via NASAL
  Filled 2016-02-13: qty 22

## 2016-02-13 MED ORDER — ONDANSETRON HCL 4 MG/2ML IJ SOLN: 4.0000 mg | Freq: Four times a day (QID) | INTRAMUSCULAR | Status: DC | PRN

## 2016-02-13 MED ORDER — METHYLPREDNISOLONE SODIUM SUCC 125 MG IJ SOLR
60.0000 mg | INTRAMUSCULAR | Status: DC
  Administered 2016-02-13 – 2016-02-14 (×2): 60 mg via INTRAVENOUS
  Filled 2016-02-13 (×2): qty 2

## 2016-02-13 MED ORDER — ISOSORBIDE DINITRATE 10 MG PO TABS
20.0000 mg | ORAL_TABLET | Freq: Three times a day (TID) | ORAL | Status: DC
Start: 2016-02-13 — End: 2016-02-15
  Administered 2016-02-13 – 2016-02-15 (×8): 20 mg via ORAL
  Filled 2016-02-13 (×8): qty 2

## 2016-02-13 MED ORDER — PIPERACILLIN-TAZOBACTAM 3.375 G IVPB: 3.3750 g | Freq: Three times a day (TID) | INTRAVENOUS | Status: DC

## 2016-02-13 MED ORDER — ACETAMINOPHEN 325 MG PO TABS
650.0000 mg | ORAL_TABLET | Freq: Four times a day (QID) | ORAL | Status: DC | PRN
  Administered 2016-02-14 – 2016-02-15 (×2): 650 mg via ORAL
  Filled 2016-02-13 (×2): qty 2

## 2016-02-13 MED ORDER — LEVOTHYROXINE SODIUM 150 MCG PO TABS
150.0000 ug | ORAL_TABLET | Freq: Every day | ORAL | Status: DC
  Administered 2016-02-13 – 2016-02-15 (×3): 150 ug via ORAL
  Filled 2016-02-13 (×3): qty 1

## 2016-02-13 MED ORDER — ZOLPIDEM TARTRATE 5 MG PO TABS
5.0000 mg | ORAL_TABLET | Freq: Every evening | ORAL | Status: DC | PRN
  Administered 2016-02-13: 5 mg via ORAL
  Filled 2016-02-13 (×2): qty 1

## 2016-02-13 MED ORDER — BISACODYL 5 MG PO TBEC
5.0000 mg | DELAYED_RELEASE_TABLET | Freq: Every day | ORAL | Status: DC | PRN
  Administered 2016-02-15: 5 mg via ORAL
  Filled 2016-02-13: qty 1

## 2016-02-13 MED ORDER — FUROSEMIDE 40 MG PO TABS
60.0000 mg | ORAL_TABLET | Freq: Every day | ORAL | Status: DC
  Administered 2016-02-13: 09:00:00 60 mg via ORAL
  Filled 2016-02-13: qty 1

## 2016-02-13 MED ORDER — PRAVASTATIN SODIUM 40 MG PO TABS
40.0000 mg | ORAL_TABLET | Freq: Every day | ORAL | Status: DC
  Administered 2016-02-13 – 2016-02-14 (×2): 40 mg via ORAL
  Filled 2016-02-13 (×2): qty 1

## 2016-02-13 MED ORDER — ONDANSETRON HCL 4 MG PO TABS: 4.0000 mg | ORAL_TABLET | Freq: Four times a day (QID) | ORAL | Status: DC | PRN

## 2016-02-13 MED ORDER — TRAMADOL-ACETAMINOPHEN 37.5-325 MG PO TABS: 1.0000 | ORAL_TABLET | Freq: Three times a day (TID) | ORAL | Status: DC | PRN

## 2016-02-13 MED ORDER — MAGNESIUM CITRATE PO SOLN: 1.0000 | Freq: Once | ORAL | Status: DC | PRN

## 2016-02-13 MED ORDER — SENNOSIDES-DOCUSATE SODIUM 8.6-50 MG PO TABS: 1.0000 | ORAL_TABLET | Freq: Every evening | ORAL | Status: DC | PRN

## 2016-02-13 MED ORDER — METOPROLOL TARTRATE 50 MG PO TABS
100.0000 mg | ORAL_TABLET | Freq: Every day | ORAL | Status: DC
  Administered 2016-02-13 – 2016-02-14 (×2): 100 mg via ORAL
  Filled 2016-02-13 (×2): qty 2

## 2016-02-13 NOTE — ED Notes (Signed)
Labs drawn as ordered by MD; pt incontinent of urine; cleaned well and clean attends in place; pt more comfortable;

## 2016-02-13 NOTE — ED Notes (Signed)
Patient resting quietly with eyes.  No acute distress noted.

## 2016-02-13 NOTE — Progress Notes (Signed)
Pharmacy Antibiotic Note  Lisa Haney is a 82 y.o. female admitted on 02/12/2016 with sepsis.  Pharmacy has been consulted for Zosyn dosing.  Plan: Zosyn 4.5 gm IV Q8H EI (BMI > 40)  Height: 5\' 1"  (154.9 cm) Weight: 214 lb (97.1 kg) IBW/kg (Calculated) : 47.8  Temp (24hrs), Avg:97.5 F (36.4 C), Min:97.5 F (36.4 C), Max:97.5 F (36.4 C)   Recent Labs Lab 02/13/16 0025 02/13/16 0108  WBC 18.9*  --   CREATININE 0.88  --   LATICACIDVEN  --  1.3    Estimated Creatinine Clearance: 48.9 mL/min (by C-G formula based on SCr of 0.88 mg/dL).    Allergies  Allergen Reactions  . Dioxyline Nausea Only    Severe upset stomach with pain   Thank you for allowing pharmacy to be a part of this patient's care.  Laural Benes, Pharm.D., BCPS Clinical Pharmacist 02/13/2016 4:33 AM

## 2016-02-13 NOTE — Progress Notes (Signed)
Initial Nutrition Assessment  DOCUMENTATION CODES:   Morbid obesity  INTERVENTION:  -Encourage po intake. Discussed importance of well balanced diet in healing, small frequent meals -May need to add Ensure Enlive po BID, each supplement provides 350 kcal and 20 grams of protein if weight loss continues    NUTRITION DIAGNOSIS:   Inadequate oral intake related to poor appetite as evidenced by per patient/family report.    GOAL:   Patient will meet greater than or equal to 90% of their needs    MONITOR:   PO intake, Weight trends  REASON FOR ASSESSMENT:   Malnutrition Screening Tool    ASSESSMENT:     82 y.o. Female admitted with displaced PICC line for recent infusion of IV antibiotics for sinus and ear infections. Pt also with COPD exacerbation.  Pt with history of COPD, HTN, TIA, hypothyroid, MRSA sinusitis   Pt reports appetite has been decreased for the past year, not eating as much as used to.  Ate 1/2 of eggs, few bites of bagel and oatmeal this am.    Pt reports wt loss of 70 pounds in the last few years (ie 2 years). 24% wt loss in the last 2 years  Medications reviewed: lasix, solumedrol, NS at 80ml/hr Labs reviewed: Na 134, glucose 135  Nutrition-Focused physical exam completed. Findings are no fat depletion, no muscle depletion, and mild edema.     Diet Order:  Diet Heart Room service appropriate? Yes; Fluid consistency: Thin  Skin:  Reviewed, no issues  Last BM:  10/15  Height:   Ht Readings from Last 1 Encounters:  02/13/16 5\' 1"  (1.549 m)    Weight:   Wt Readings from Last 1 Encounters:  02/13/16 217 lb 1 oz (98.5 kg)    Ideal Body Weight:     BMI:  Body mass index is 41.01 kg/m.  Estimated Nutritional Needs:   Kcal:  2100 kcals/d  Protein:  98-107 g/d  Fluid:  >/=2149ml/d  EDUCATION NEEDS:   Education needs addressed  Aseret Hoffman B. Zenia Resides, [place], [place] (pager) Weekend/On-Call pager [phone])

## 2016-02-13 NOTE — Progress Notes (Signed)
[facility] [place] NAME: Lisa Haney    MR#:  [id number]  DATE OF BIRTH:  March 05, 1927  SUBJECTIVE:  Came in after she had her picc line come out. Had SOB Appears weak  REVIEW OF SYSTEMS:   Review of Systems  Constitutional: Negative for chills, fever and weight loss.  HENT: Negative for ear discharge, ear pain and nosebleeds.   Eyes: Negative for blurred vision, pain and discharge.  Respiratory: Positive for shortness of breath. Negative for sputum production, wheezing and stridor.   Cardiovascular: Negative for chest pain, palpitations, orthopnea and PND.  Gastrointestinal: Negative for abdominal pain, diarrhea, nausea and vomiting.  Genitourinary: Negative for frequency and urgency.  Musculoskeletal: Negative for back pain and joint pain.  Neurological: Positive for weakness. Negative for sensory change, speech change and focal weakness.  Psychiatric/Behavioral: Negative for depression and hallucinations. The patient is not nervous/anxious.    Tolerating Diet:yes Tolerating PT: pending  DRUG ALLERGIES:   Allergies  Allergen Reactions  . Dioxyline Nausea Only    Severe upset stomach with pain    VITALS:  Blood pressure (!) 167/50, pulse 84, temperature 98 F (36.7 C), temperature source Oral, resp. rate (!) 22, height 5\' 1"  (1.549 m), weight 98.5 kg (217 lb 1 oz), SpO2 95 %.  PHYSICAL EXAMINATION:   Physical Exam  GENERAL:  82 y.o.-year-old patient lying in the bed with no acute distress. Overall weak EYES: Pupils equal, round, reactive to light and accommodation. No scleral icterus. Extraocular muscles intact.  HEENT: Head atraumatic, normocephalic. Oropharynx and nasopharynx clear.  NECK:  Supple, no jugular venous distention. No thyroid enlargement, no tenderness.  LUNGS: distant breath sounds bilaterally, no wheezing, rales, rhonchi. No use of accessory muscles of respiration.  CARDIOVASCULAR: S1, S2 normal. No  murmurs, rubs, or gallops.  ABDOMEN: Soft, nontender, nondistended. Bowel sounds present. No organomegaly or mass.  EXTREMITIES: No cyanosis, clubbing or edema b/l.    NEUROLOGIC: Cranial nerves II through XII are intact. No focal Motor or sensory deficits b/l.   PSYCHIATRIC:  patient is alert and oriented x 3.  SKIN: No obvious rash, lesion, or ulcer.   LABORATORY PANEL:  CBC  Recent Labs Lab 02/13/16 0025  WBC 18.9*  HGB 15.8  HCT 47.3*  PLT 151    Chemistries   Recent Labs Lab 02/13/16 0025  NA 134*  K 3.5  CL 101  CO2 25  GLUCOSE 135*  BUN 16  CREATININE 0.88  CALCIUM 8.9  MG 1.7   Cardiac Enzymes  Recent Labs Lab 02/13/16 0025  TROPONINI <0.03   RADIOLOGY:  Dg Chest 2 View  Result Date: 02/12/2016 CLINICAL DATA:  Dislodged PICC line. EXAM: CHEST  2 VIEW COMPARISON:  None. FINDINGS: Right-sided PICC line is no longer visualized. No radiopaque foreign bodies seen. Heart is enlarged but stable. Atherosclerosis of the thoracic aorta. No pneumonic consolidation CHF, pneumothorax nor pleural effusion. No suspicious osseous abnormalities. IMPRESSION: PICC line is no longer visualized. No radiopaque foreign body identified. Stable cardiomegaly. Electronically Signed   By: Ashley Royalty M.D.   On: 02/12/2016 21:43   Dg Chest Port 1 View  Result Date: 02/13/2016 CLINICAL DATA:  Preop for central line placement EXAM: PORTABLE CHEST 1 VIEW COMPARISON:  02/12/2016 FINDINGS: Borderline cardiomegaly. Central mild vascular congestion and mild interstitial prominence bilaterally suspicious for mild interstitial edema. Mild basilar atelectasis or infiltrate. Atherosclerotic calcifications of thoracic aorta. Degenerative changes bilateral shoulders. IMPRESSION: Central mild vascular congestion and  mild interstitial prominence bilaterally suspicious for mild interstitial edema. Mild basilar atelectasis or infiltrate. Atherosclerotic calcifications of thoracic aorta. Electronically  Signed   By: Lahoma Crocker M.D.   On: 02/13/2016 08:39   ASSESSMENT AND PLAN:   82 y.o. female with a history of COPD, hypertension, TIA, hyperlipidemia, hypothyroid, MRSA sinusitis with PICC line  now being admitted with:  1. COPD exacerbation, acute on chronic -cont IV steroids, nebs, sputum culture, O2 -follow wbc. On IV steroids  2. history of MRSA sinusitis currently on vancomycin  -continue Vanco, PICC team to place a new line since PICC was disrupted.  -pt has been on vanc since 02/02/16 and needs for 4 weeks 03/04/16 per Dr fitzgerald  3. Hypertension - continue Norvasc, Clonidine, Lasic, Isordil, lisinopril, metorpolol  5. History of hyperlipidemia - continue Pravastatin  6. History of hypothyroid - continue Synthroid  gen weakness-PT to see ot CM for d/c planning  Case discussed with Care Management/Social Worker. Management plans discussed with the patient, family and they are in agreement.  CODE STATUS:FULL  DVT Prophylaxis: lovenox  TOTAL TIME TAKING CARE OF THIS PATIENT: 30 minutes.  >50% time spent on counselling and coordination of care  POSSIBLE D/C IN 1-2 DAYS, DEPENDING ON CLINICAL CONDITION.  Note: This dictation was prepared with Dragon dictation along with smaller phrase technology. Any transcriptional errors that result from this process are unintentional.  Avraham Benish M.D on 02/13/2016 at 12:07 PM  Between 7am to 6pm - Pager - [phone]  After 6pm go to [email or web address] - password [email or web address] [facility]  Office  [phone]  CC: Primary care physician; Glendon Axe, MD

## 2016-02-13 NOTE — H&P (Signed)
[place] @ [facility] Admission History and Physical [employer], D.O.  ---------------------------------------------------------------------------------------------------------------------   PATIENT NAME: Lisa Haney MR#: [id number] DATE OF BIRTH: [birth year] DATE OF ADMISSION: 02/12/2016 PRIMARY CARE PHYSICIAN: Glendon Axe, MD  REQUESTING/REFERRING PHYSICIAN: ED Dr. Beather Arbour  CHIEF COMPLAINT: Chief Complaint  Patient presents with  . Vascular Access Problem    HISTORY OF PRESENT ILLNESS: Lisa Haney is a 82 y.o. female with a known history of COPD, hypertension, TIA, diverticulitis, recent MRSA sinus infection on IV Vanco presents to the emergency department complaining of displaced PICC line.  She has been undergoing daily IV vancomycin for recurrent sinus and ear infections which her pulse culture positive for MRSA. PICC line was placed on 02/03/2016 the patient noticed today that her PICC line was pulled out of her right arm. She denies any fevers chills nausea vomiting. The plan was initially to admit her for PICC line placement this morning however while in the emergency department patient become acutely short of breath and was noted to have wheezing with O2 sats around 85% on 2 L. She received SoluMedrol duo nebs and her sats improved to 92% with 4 L of O2 via nasal cannula. Hospitalists were contacted for admission for COPD exacerbation  Otherwise there has been no change in status. Patient has been taking medication as prescribed and there has been no recent change in medication or diet.  There has been no recent illness, travel or sick contacts.    Patient denies fevers/chills, weakness, dizziness, chest pain,  N/V/C/D, abdominal pain, dysuria/frequency, changes in mental status.   EMS/ED COURSE:   Patient received Solu-Medrol, DuoNeb's.  PAST MEDICAL HISTORY: Past Medical History:  Diagnosis Date  . Cancer ([place])   . COPD (chronic obstructive  pulmonary disease) ([place])   . Diverticulitis   . Hypertension   . TIA (transient ischemic attack)       PAST SURGICAL HISTORY: Past Surgical History:  Procedure Laterality Date  . ABDOMINAL HYSTERECTOMY    . CAROTID STENT    . CHOLECYSTECTOMY    . DILATION AND CURETTAGE OF UTERUS    . NASAL SINUS SURGERY    . OVARIAN CYST REMOVAL    . REPLACEMENT TOTAL KNEE Right   . TONSILLECTOMY        SOCIAL HISTORY: Social History  Substance Use Topics  . Smoking status: Former Research scientist (life sciences)  . Smokeless tobacco: Never Used  . Alcohol use No      FAMILY HISTORY: History reviewed. No pertinent family history.   MEDICATIONS AT HOME: Prior to Admission medications   Medication Sig Start Date End Date Taking? Authorizing Provider  acetaminophen (TYLENOL) 650 MG CR tablet Take 650 mg by mouth every 8 (eight) hours as needed for pain.    Historical Provider, MD  albuterol (PROVENTIL HFA;VENTOLIN HFA) 108 (90 Base) MCG/ACT inhaler Inhale 2 puffs into the lungs every 6 (six) hours as needed for wheezing or shortness of breath.    Historical Provider, MD  amLODipine (NORVASC) 5 MG tablet Take 5 mg by mouth daily.    Historical Provider, MD  Cholecalciferol 1000 UNIT/10ML LIQD Take 1,000 Units by mouth daily.    Historical Provider, MD  cloNIDine (CATAPRES) 0.2 MG tablet Take 0.2 mg by mouth daily.    Historical Provider, MD  cyclobenzaprine (FLEXERIL) 5 MG tablet Take 5 mg by mouth daily.    Historical Provider, MD  furosemide (LASIX) 20 MG tablet Take 60 mg by mouth daily.    Historical Provider, MD  isosorbide  dinitrate (ISORDIL) 20 MG tablet Take 20 mg by mouth 3 (three) times daily.    Historical Provider, MD  levothyroxine (SYNTHROID, LEVOTHROID) 150 MCG tablet Take 150 mcg by mouth daily before breakfast. Take on an empty stomach 30 to 60 minutes before breakfast    Historical Provider, MD  lisinopril (PRINIVIL,ZESTRIL) 20 MG tablet Take 20 mg by mouth daily.    Historical Provider, MD   metoprolol (LOPRESSOR) 100 MG tablet Take 100 mg by mouth daily.    Historical Provider, MD  neomycin-polymyxin-dexamethasone (MAXITROL) 0.1 % ophthalmic suspension Place into both eyes 2 (two) times daily.    Historical Provider, MD  pravastatin (PRAVACHOL) 40 MG tablet Take 40 mg by mouth at bedtime.    Historical Provider, MD  traMADol (ULTRAM) 50 MG tablet Take 50 mg by mouth every 8 (eight) hours as needed. Take one to two tablets 3 times a day as needed for pain    Historical Provider, MD  traMADol-acetaminophen (ULTRACET) 37.5-325 MG tablet Take 1 tablet by mouth every 8 (eight) hours as needed for severe pain.    Historical Provider, MD      DRUG ALLERGIES: Allergies  Allergen Reactions  . Dioxyline Nausea Only    Severe upset stomach with pain     REVIEW OF SYSTEMS: CONSTITUTIONAL: No fatigue, weakness, fever, chills, weight gain/loss, headache EYES: No blurry or double vision. ENT: No tinnitus, postnasal drip, redness or soreness of the oropharynx. RESPIRATORY: Positive dyspnea, negative cough, wheeze, hemoptysis. CARDIOVASCULAR: No chest pain, orthopnea, palpitations, syncope. GASTROINTESTINAL: No nausea, vomiting, constipation, diarrhea, abdominal pain. No hematemesis, melena or hematochezia. GENITOURINARY: No dysuria, frequency, hematuria. ENDOCRINE: No polyuria or nocturia. No heat or cold intolerance. HEMATOLOGY: No anemia, bruising, bleeding. INTEGUMENTARY: No rashes, ulcers, lesions. MUSCULOSKELETAL: No pain, arthritis, swelling, gout. NEUROLOGIC: No numbness, tingling, weakness or ataxia. No seizure-type activity. PSYCHIATRIC: No anxiety, depression, insomnia.  PHYSICAL EXAMINATION: VITAL SIGNS: Blood pressure (!) 144/59, pulse 75, temperature 97.5 F (36.4 C), temperature source Oral, resp. rate (!) 22, height 5\' 1"  (1.549 m), weight 97.1 kg (214 lb), SpO2 92 %.  GENERAL: 82 y.o.-year-old white female patient, well-developed, well-nourished lying in the bed in  no acute distress.  Pleasant and cooperative.  CPAP in place HEENT: Head atraumatic, normocephalic. Pupils equal, round, reactive to light and accommodation. No scleral icterus. Extraocular muscles intact. Oropharynx is clear. Mucus membranes moist. NECK: Supple, full range of motion. No JVD, no bruit heard. No cervical lymphadenopathy. CHEST: Diminished breath sounds bilaterally with mild expiratory wheezes. No rales, rhonchi or crackles. No use of accessory muscles of respiration.  No reproducible chest wall tenderness.  CARDIOVASCULAR: S1, S2 normal. No murmurs, rubs, or gallops appreciated. Cap refill <2 seconds. ABDOMEN: Soft, nontender, nondistended. No rebound, guarding, rigidity. Normoactive bowel sounds present in all four quadrants. No organomegaly or mass. EXTREMITIES: Full range of motion. No pedal edema, cyanosis, or clubbing. Mild nonpitting bilateral lower edema to the ankle NEUROLOGIC: Cranial nerves II through XII are grossly intact with no focal sensorimotor deficit. Muscle strength 5/5 in all extremities. Sensation intact. Gait not checked. PSYCHIATRIC: The patient is alert and oriented x 3. Normal affect, mood, thought content. SKIN: Warm, dry, and intact without obvious rash, lesion, or ulcer.  LABORATORY PANEL:  CBC  Recent Labs Lab 02/13/16 0025  WBC 18.9*  HGB 15.8  HCT 47.3*  PLT 151   ----------------------------------------------------------------------------------------------------------------- Chemistries  Recent Labs Lab 02/13/16 0025  NA 134*  K 3.5  CL 101  CO2 25  GLUCOSE 135*  BUN 16  CREATININE 0.88  CALCIUM 8.9   ------------------------------------------------------------------------------------------------------------------ Cardiac Enzymes  Recent Labs Lab 02/13/16 0025  TROPONINI <0.03   ------------------------------------------------------------------------------------------------------------------  RADIOLOGY: Dg Chest 2  View  Result Date: 02/12/2016 CLINICAL DATA:  Dislodged PICC line. EXAM: CHEST  2 VIEW COMPARISON:  None. FINDINGS: Right-sided PICC line is no longer visualized. No radiopaque foreign bodies seen. Heart is enlarged but stable. Atherosclerosis of the thoracic aorta. No pneumonic consolidation CHF, pneumothorax nor pleural effusion. No suspicious osseous abnormalities. IMPRESSION: PICC line is no longer visualized. No radiopaque foreign body identified. Stable cardiomegaly. Electronically Signed   By: Ashley Royalty M.D.   On: 02/12/2016 21:43    EKG: Normal sinus rhythm at 89 bpm with normal axis and nonspecific ST-T wave changes.   IMPRESSION AND PLAN:  This is a 82 y.o. female with a history of COPD, hypertension, TIA, hyperlipidemia, hypothyroid, MRSA sinusitis with PICC line  now being admitted with: 1. COPD exacerbation - admit inpatient for IV steroids, nebs, sputum culture, O2 2. Sepsis, history of MRSA sinusitis currently on vancomycin, possibly picc line infection - Add Zosyn, check blood cultures, continue Vanco, PICC team to place a new line since PICC was disrupted.  3. Hypertension - continue Norvasc, Clonidine, Lasic, Isordil, lisinopril, metorpolol 5. History of hyperlipidemia - continue Pravastatin 6. History of hypothyroid - continue Synthroid   Diet/Nutrition: heart healthy  Fluids: IVNS DVT Px: Lovenox, SCDs and early ambulation Code Status: Full  All the records are reviewed and case discussed with ED provider. Management plans discussed with the patient and/or family who express understanding and agree with plan of care.   TOTAL TIME TAKING CARE OF THIS PATIENT: 60 minutes.   Lisa Haney D.O. on 02/13/2016 at 2:31 AM Between 7am to 6pm - Pager - [phone] After 6pm go to [email or web address] - Proofreader [facility] Office [phone] CC: Primary care physician; Glendon Axe, MD     Note: This dictation was prepared with  Dragon dictation along with smaller phrase technology. Any transcriptional errors that result from this process are unintentional.

## 2016-02-13 NOTE — ED Notes (Addendum)
After taking scheduled night time medications, pt noted to be wheezing; wheezes noted throughout; sats 88% on room air; son has left to go home for pt's bi-pap; oxygen placed at 2L via [place]; spoke with Dr Beather Arbour and notified her of change in pt condition

## 2016-02-13 NOTE — Progress Notes (Signed)
Pharmacy Antibiotic Note  Lisa Haney is a 82 y.o. female admitted on 02/12/2016 with ear/sinus infection and sepsis.  Pharmacy has been consulted for vancomycin and piperacillin/tazobactam dosing.  Plan: Pt has been receiving vanc outpt via PICC due to MRSA ear/sinus infection. Will check random trough tonight (10/16 2100) prior to dose (goal 15-20) and likely begin vancomycin 1000 mg IV q 24 hours.  ABW = 68kg; IBW CrCl = 34.6 ml/min; ke=0.033 VD=47.6 t1/2 21 hours. Estimated trough Css = 18.8 Pt is also receiving piperacillin/tazobactam 3.375g IV q8h  10/16 PM vanc level 16. Vancomycin 1 gram q 24 hours initiated. Level ordered before 4th dose in house.    Height: 5\' 1"  (154.9 cm) Weight: 217 lb 1 oz (98.5 kg) IBW/kg (Calculated) : 47.8  Temp (24hrs), Avg:97.8 F (36.6 C), Min:97.5 F (36.4 C), Max:98 F (36.7 C)   Recent Labs Lab 02/13/16 0025 02/13/16 0108 02/13/16 2141  WBC 18.9*  --   --   CREATININE 0.88  --   --   LATICACIDVEN  --  1.3  --   VANCORANDOM  --   --  16    Estimated Creatinine Clearance: 49.3 mL/min (by C-G formula based on SCr of 0.88 mg/dL).    Allergies  Allergen Reactions  . Dioxyline Nausea Only    Severe upset stomach with pain    Antimicrobials this admission: Vanc 10/15 >> Zosyn 10/16 >>  Dose adjustments this admission: Zosyn 4.5 -> 3.375  Microbiology results: 10/16 BCx: pending: no growth x 12 hours 10/16 UCx: sent 10/16 Sputum: sent 10/16 MRSA PCR: positive  Thank you for allowing pharmacy to be a part of this patient's care.  Darrow Bussing, PharmD Pharmacy Resident 02/13/2016 11:20 PM

## 2016-02-13 NOTE — ED Notes (Addendum)
Son has returned with pt's bi-pap machine; placed on pt; sats around 90% on 2L, increased to 3L; pt leaving to go home;

## 2016-02-13 NOTE — ED Notes (Signed)
More labs drawn as ordered by MD; pt resting more comfortably; call bell in reach, pt encouraged to use for any needs

## 2016-02-13 NOTE — Care Management (Addendum)
Admitted to [facility] with the diagnosis of COPD. Son, [name], lives with her [phone]). Lolly Mustache [phone]). States she has seen Dr, Candiss Norse "not too long ago." Bi-PAP in the home for several years, Home oxygen through [facility] 3-4 years. Home Health in the home now for IV antibiotics. Followed by [facility] for home health. [place] following knee replacement in the past. No falls. Appetite not too good. Lost 70 lbs in the past year. No falls. Takes care of all basic activities of daily living "the best I can."Prescriptions are filled at [facility] in [place] will transport.  Shelbie Ammons RN MSN CCM Care Management [phone]

## 2016-02-13 NOTE — Care Management Important Message (Signed)
Important Message  Patient Details  Name: DORETHY LALIBERTE MRN: [id number] Date of Birth: [birth year]   Medicare Important Message Given:  Yes    Shelbie Ammons, RN 02/13/2016, 8:02 AM

## 2016-02-13 NOTE — Progress Notes (Signed)
Pharmacy Antibiotic Note  Lisa Haney is a 82 y.o. female admitted on 02/12/2016 with ear/sinus infection and sepsis.  Pharmacy has been consulted for vancomycin and piperacillin/tazobactam dosing.  Plan: Pt has been receiving vanc outpt via PICC due to MRSA ear/sinus infection. Will check random trough tonight (10/16 2100) prior to dose (goal 15-20) and likely begin vancomycin 1000 mg IV q 24 hours.  ABW = 68kg; IBW CrCl = 34.6 ml/min; ke=0.033 VD=47.6 t1/2 21 hours. Estimated trough Css = 18.8 Pt is also receiving piperacillin/tazobactam 3.375g IV q8h  Height: 5\' 1"  (154.9 cm) Weight: 217 lb 1 oz (98.5 kg) IBW/kg (Calculated) : 47.8  Temp (24hrs), Avg:97.8 F (36.6 C), Min:97.5 F (36.4 C), Max:98 F (36.7 C)   Recent Labs Lab 02/13/16 0025 02/13/16 0108  WBC 18.9*  --   CREATININE 0.88  --   LATICACIDVEN  --  1.3    Estimated Creatinine Clearance: 49.3 mL/min (by C-G formula based on SCr of 0.88 mg/dL).    Allergies  Allergen Reactions  . Dioxyline Nausea Only    Severe upset stomach with pain    Antimicrobials this admission: Vanc 10/15 >> Zosyn 10/16 >>  Dose adjustments this admission: Zosyn 4.5 -> 3.375  Microbiology results: 10/16 BCx: pending: no growth x 12 hours 10/16 UCx: sent 10/16 Sputum: sent 10/16 MRSA PCR: positive  Thank you for allowing pharmacy to be a part of this patient's care.  Darrow Bussing, PharmD Pharmacy Resident 02/13/2016 9:44 AM

## 2016-02-14 MED ORDER — FUROSEMIDE 10 MG/ML IJ SOLN
40.0000 mg | Freq: Two times a day (BID) | INTRAMUSCULAR | Status: DC
  Administered 2016-02-14 (×2): 40 mg via INTRAVENOUS
  Filled 2016-02-14 (×3): qty 4

## 2016-02-14 MED ORDER — METOPROLOL TARTRATE 50 MG PO TABS
50.0000 mg | ORAL_TABLET | Freq: Two times a day (BID) | ORAL | Status: DC
  Administered 2016-02-14 – 2016-02-15 (×2): 50 mg via ORAL
  Filled 2016-02-14 (×2): qty 1

## 2016-02-14 NOTE — Progress Notes (Signed)
[place] at [place] NAME: Lisa Haney    MR#:  [id number]  DATE OF BIRTH:  01-14-1927  SUBJECTIVE:  Came in after she had her picc line come out. Has SOB Appears weak Tachycardia with intermittent NSVT REVIEW OF SYSTEMS:   Review of Systems  Constitutional: Negative for chills, fever and weight loss.  HENT: Negative for ear discharge, ear pain and nosebleeds.   Eyes: Negative for blurred vision, pain and discharge.  Respiratory: Positive for shortness of breath. Negative for sputum production, wheezing and stridor.   Cardiovascular: Negative for chest pain, palpitations, orthopnea and PND.  Gastrointestinal: Negative for abdominal pain, diarrhea, nausea and vomiting.  Genitourinary: Negative for frequency and urgency.  Musculoskeletal: Negative for back pain and joint pain.  Neurological: Positive for weakness. Negative for sensory change, speech change and focal weakness.  Psychiatric/Behavioral: Negative for depression and hallucinations. The patient is not nervous/anxious.    Tolerating Diet:yes Tolerating PT: pending  DRUG ALLERGIES:   Allergies  Allergen Reactions  . Dioxyline Nausea Only    Severe upset stomach with pain    VITALS:  Blood pressure (!) 146/66, pulse 99, temperature 97.3 F (36.3 C), temperature source Oral, resp. rate (!) 23, height 5\' 1"  (1.549 m), weight 98.5 kg (217 lb 1 oz), SpO2 92 %.  PHYSICAL EXAMINATION:   Physical Exam  GENERAL:  82 y.o.-year-old patient lying in the bed with no acute distress. Overall weak EYES: Pupils equal, round, reactive to light and accommodation. No scleral icterus. Extraocular muscles intact.  HEENT: Head atraumatic, normocephalic. Oropharynx and nasopharynx clear.  NECK:  Supple, no jugular venous distention. No thyroid enlargement, no tenderness.  LUNGS: distant breath sounds bilaterally, no wheezing, rales, rhonchi. No use of accessory muscles of respiration.   CARDIOVASCULAR: S1, S2 normal. No murmurs, rubs, or gallops.  ABDOMEN: Soft, nontender, nondistended. Bowel sounds present. No organomegaly or mass.  EXTREMITIES: No cyanosis, clubbing  -++ edema b/l.    NEUROLOGIC: Cranial nerves II through XII are intact. No focal Motor or sensory deficits b/l.   PSYCHIATRIC:  patient is alert and oriented x 3.  SKIN: No obvious rash, lesion, or ulcer.   LABORATORY PANEL:  CBC  Recent Labs Lab 02/13/16 0025  WBC 18.9*  HGB 15.8  HCT 47.3*  PLT 151    Chemistries   Recent Labs Lab 02/13/16 0025  NA 134*  K 3.5  CL 101  CO2 25  GLUCOSE 135*  BUN 16  CREATININE 0.88  CALCIUM 8.9  MG 1.7   Cardiac Enzymes  Recent Labs Lab 02/13/16 0025  TROPONINI <0.03   RADIOLOGY:  Dg Chest 2 View  Result Date: 02/12/2016 CLINICAL DATA:  Dislodged PICC line. EXAM: CHEST  2 VIEW COMPARISON:  None. FINDINGS: Right-sided PICC line is no longer visualized. No radiopaque foreign bodies seen. Heart is enlarged but stable. Atherosclerosis of the thoracic aorta. No pneumonic consolidation CHF, pneumothorax nor pleural effusion. No suspicious osseous abnormalities. IMPRESSION: PICC line is no longer visualized. No radiopaque foreign body identified. Stable cardiomegaly. Electronically Signed   By: Ashley Royalty M.D.   On: 02/12/2016 21:43   Dg Chest Port 1 View  Result Date: 02/13/2016 CLINICAL DATA:  PICC placement EXAM: PORTABLE CHEST 1 VIEW COMPARISON:  02/13/2016 FINDINGS: Right arm PICC tip in the proximal SVC. Diffuse bilateral airspace disease has improved. There remains asymmetric airspace disease on the right which has improved. No significant pleural effusion. IMPRESSION: Right arm PICC tip in  the proximal SVC Improvement in bilateral pulmonary edema. Electronically Signed   By: Franchot Gallo M.D.   On: 02/13/2016 12:07   Dg Chest Port 1 View  Result Date: 02/13/2016 CLINICAL DATA:  Preop for central line placement EXAM: PORTABLE CHEST 1 VIEW  COMPARISON:  02/12/2016 FINDINGS: Borderline cardiomegaly. Central mild vascular congestion and mild interstitial prominence bilaterally suspicious for mild interstitial edema. Mild basilar atelectasis or infiltrate. Atherosclerotic calcifications of thoracic aorta. Degenerative changes bilateral shoulders. IMPRESSION: Central mild vascular congestion and mild interstitial prominence bilaterally suspicious for mild interstitial edema. Mild basilar atelectasis or infiltrate. Atherosclerotic calcifications of thoracic aorta. Electronically Signed   By: Lahoma Crocker M.D.   On: 02/13/2016 08:39   ASSESSMENT AND PLAN:   82 y.o. female with a history of COPD, hypertension, TIA, hyperlipidemia, hypothyroid, MRSA sinusitis with PICC line  now being admitted with:  1. Acute respiratory failure due to COPD exacerbation, acute on chronic and acute on chronic CHF diastolic EF [id number] with myoview done in 2016 -cont IV steroids, nebs, sputum culture, O2 -follow wbc. On IV steroids---change to po steroids -will give IV lasix 40 mg bid and place foley today -CXR shows pulmonary edema  2. history of MRSA sinusitis currently on vancomycin  -continue Vanco -s/p new PICC line placed on 0ct 17th -pt has been on vanc since 02/02/16 and needs for 4 weeks 03/04/16 per Dr fitzgerald  3. Hypertension - continue Norvasc, Clonidine, Lasic, Isordil, lisinopril, metorpolol  5. History of hyperlipidemia - continue Pravastatin  6. History of hypothyroid - continue Synthroid  gen weakness-PT to see ot CM for d/c planning  Case discussed with Care Management/Social Worker. Management plans discussed with the patient, family and they are in agreement.  CODE STATUS:FULL  DVT Prophylaxis: lovenox  TOTAL TIME TAKING CARE OF THIS PATIENT: 30 minutes.  >50% time spent on counselling and coordination of care pt and RN  POSSIBLE D/C IN 1-2 DAYS, DEPENDING ON CLINICAL CONDITION.  Note: This dictation was prepared with Dragon  dictation along with smaller phrase technology. Any transcriptional errors that result from this process are unintentional.  Lisa Haney M.D on 02/14/2016 at 8:18 AM  Between 7am to 6pm - Pager - [phone]  After 6pm go to [email or web address] - password [email or web address] [facility]  Office  [phone]  CC: Primary care physician; Glendon Axe, MD

## 2016-02-14 NOTE — Evaluation (Signed)
Physical Therapy Evaluation Patient Details Name: Lisa Haney MRN: [id number] DOB: [birth year] Today's Date: 02/14/2016   History of Present Illness  Pt admitted for COPD exacerbation. Pt with new CHF dx as well. PMH includes COPD, HTN, and TIA. Pt on chronic home O2 at night.  Clinical Impression  Pt is a pleasant 82 year old female who was admitted for COPD exacerbation. Pt performs bed mobility with mod I, transfers with cga, and ambulation with cga and RW. All mobility performed while on 3L of O2.  Pt demonstrates deficits with strength/endurance/mobility. Pt fatigues with limited distance. Pt reports limited baseline level. Would benefit from skilled PT to address above deficits and promote optimal return to PLOF. Recommend transition to [place] upon discharge from acute hospitalization.       Follow Up Recommendations Home health PT    Equipment Recommendations       Recommendations for Other Services       Precautions / Restrictions Precautions Precautions: Fall Restrictions Weight Bearing Restrictions: No      Mobility  Bed Mobility Overal bed mobility: Modified Independent             General bed mobility comments: safe technique with supervision given. Pt used bedrails to assist transfer to EOB. Once seated at EOB, pt able to sit with independence  Transfers Overall transfer level: Needs assistance Equipment used: Rolling walker (2 wheeled) Transfers: Sit to/from Stand Sit to Stand: Min guard         General transfer comment: Safe technique with cues for pushing from seated surface. UPright posture noted. All mobility performed on 3L of O2  Ambulation/Gait Ambulation/Gait assistance: Min guard Ambulation Distance (Feet): 15 Feet Assistive device: Rolling walker (2 wheeled) Gait Pattern/deviations: Step-through pattern     General Gait Details: ambulated in room to recliner with fatigue present. O2 sats decreased to 88% with limited exertion. Cues  for pursed lip breathing and able to improve to 90% within 30 seconds. Slow reciprocal gait pattern performed   Stairs            Wheelchair Mobility    Modified Rankin (Stroke Patients Only)       Balance Overall balance assessment: Needs assistance Sitting-balance support: Feet supported Sitting balance-Leahy Scale: Good     Standing balance support: Bilateral upper extremity supported Standing balance-Leahy Scale: Fair                               Pertinent Vitals/Pain Pain Assessment: No/denies pain    Home Living Family/patient expects to be discharged to:: Private residence Living Arrangements: Children Available Help at Discharge: Family (lives with son) Type of Home: House Home Access: Ramped entrance     Home Layout: One level Home Equipment: Environmental consultant - 2 wheels      Prior Function Level of Independence: Needs assistance         Comments: household ambulator using RW at home, is alone during day     Hand Dominance        Extremity/Trunk Assessment   Upper Extremity Assessment: Generalized weakness (B UE grossly 4/5)           Lower Extremity Assessment: Generalized weakness (B LE grossly 4/5)         Communication   Communication: No difficulties  Cognition Arousal/Alertness: Awake/alert Behavior During Therapy: WFL for tasks assessed/performed Overall Cognitive Status: Within Functional Limits for tasks assessed  General Comments      Exercises Other Exercises Other Exercises: Supine ther-ex performed including B LE ankle pumps, quad sets, SLRs, and hip abd/add. All ther-ex performed x 10 reps with cga. No SOB symptoms noted.   Assessment/Plan    PT Assessment Patient needs continued PT services  PT Problem List Decreased strength;Decreased activity tolerance;Decreased mobility;Decreased knowledge of use of DME          PT Treatment Interventions Gait training;Therapeutic  activities;Therapeutic exercise    PT Goals (Current goals can be found in the Care Plan section)  Acute Rehab PT Goals Patient Stated Goal: to get stronger PT Goal Formulation: With patient Time For Goal Achievement: 02/28/16 Potential to Achieve Goals: Good    Frequency Min 2X/week   Barriers to discharge        Co-evaluation               End of Session Equipment Utilized During Treatment: Gait belt;Oxygen Activity Tolerance: Patient limited by fatigue Patient left: in chair;with chair alarm set;with nursing/sitter in room Nurse Communication: Mobility status         Time: [id number] PT Time Calculation (min) (ACUTE ONLY): 20 min   Charges:   PT Evaluation $PT Eval Moderate Complexity: 1 Procedure PT Treatments $Therapeutic Exercise: 8-22 mins   PT G Codes:        Kateena Degroote 02-25-2016, 11:55 AM  Greggory Stallion, PT, DPT [phone]

## 2016-02-15 LAB — BASIC METABOLIC PANEL
ANION GAP: 8 (ref 5–15)
BUN: 32 mg/dL — ABNORMAL HIGH (ref 6–20)
CALCIUM: 9 mg/dL (ref 8.9–10.3)
CO2: 28 mmol/L (ref 22–32)
Chloride: 100 mmol/L — ABNORMAL LOW (ref 101–111)
Creatinine, Ser: 1.08 mg/dL — ABNORMAL HIGH (ref 0.44–1.00)
GFR, EST AFRICAN AMERICAN: 52 mL/min — AB (ref >60)
GFR, EST NON AFRICAN AMERICAN: 45 mL/min — AB (ref >60)
GLUCOSE: 133 mg/dL — AB (ref 65–99)
POTASSIUM: 4 mmol/L (ref 3.5–5.1)
SODIUM: 136 mmol/L (ref 135–145)

## 2016-02-15 MED ORDER — FUROSEMIDE 40 MG PO TABS
60.0000 mg | ORAL_TABLET | Freq: Every day | ORAL | Status: DC
  Administered 2016-02-15: 60 mg via ORAL
  Filled 2016-02-15: qty 1

## 2016-02-15 MED ORDER — VANCOMYCIN HCL IN DEXTROSE 1-5 GM/200ML-% IV SOLN: 1000.0000 mg | INTRAVENOUS | 0 refills | Status: DC

## 2016-02-15 NOTE — Progress Notes (Addendum)
SATURATION QUALIFICATIONS: (This note is used to comply with regulatory documentation for home oxygen)  Patient Saturations on Room Air at Rest = 92%  Patient Saturations on Room Air while Ambulating = 86%  Patient Saturations on 2 Liters of oxygen while Ambulating = 92%  Please briefly explain why patient needs home oxygen: Pt desats to 86% with minimal exertion- sitting at the side of the bed.

## 2016-02-15 NOTE — Care Management Important Message (Signed)
Important Message  Patient Details  Name: AGAM WRUBEL MRN: [id number] Date of Birth: 1926/11/15   Medicare Important Message Given:  Yes    Shelbie Ammons, RN 02/15/2016, 7:55 AM

## 2016-02-15 NOTE — Care Management (Addendum)
Discharge to home today per Dr. Posey Pronto. Qualified for continuous oxygen at 2 liters. Lisa Haney has her BIPAP through [facility]. Would like her home oxygen theough [facility]. All information faxed to [facility]. Requested oxygen to be delivered to hospital  [address]. [facility] representative updated with discharge information. States that her office will notify infusion company concerning resumption of Vancomycin.  Shelbie Ammons RN MSN CCM Care Management [phone]

## 2016-02-15 NOTE — Care Management Important Message (Signed)
Important Message  Patient Details  Name: NADA MEKEEL MRN: [id number] Date of Birth: 01-20-1927   Medicare Important Message Given:  Yes    Shelbie Ammons, RN 02/15/2016, 7:55 AM

## 2016-02-15 NOTE — Discharge Summary (Signed)
[facility] NAME: Lisa Haney    MR#:  [id number]  DATE OF BIRTH:  1927/04/10  DATE OF ADMISSION:  02/12/2016 ADMITTING PHYSICIAN: Harvie Bridge, DO  DATE OF DISCHARGE: 02/15/16  PRIMARY CARE PHYSICIAN: Singh,Jasmine, MD    ADMISSION DIAGNOSIS:  Hypoxia [R09.02] Status post PICC central line placement [Z95.828] Leukocytosis, unspecified type [D72.829] Chronic obstructive pulmonary disease, unspecified COPD type ([place]) [J44.9]  DISCHARGE DIAGNOSIS:  MRSA sinusitis with ongoing IV vancomycin (till 03/04/16) CHF acute on chronic Diastolic  SECONDARY DIAGNOSIS:   Past Medical History:  Diagnosis Date  . Cancer ([place])   . COPD (chronic obstructive pulmonary disease) ([place])   . Diverticulitis   . Hypertension   . TIA (transient ischemic attack)     HOSPITAL COURSE:   82 y.o.femalewith a history of COPD, hypertension, TIA, hyperlipidemia, hypothyroid, MRSA sinusitis with PICC line now being admitted with:  1. Acute respiratory failure due to COPD exacerbation, acute on chronic and acute on chronic CHF diastolic EF [id number] with myoview done in 2016 -recieved IV steroids--no need for po taper sterids, nebs,  -on chronic home O2 -follow wbc. On IV steroids---change to po steroids - received IV lasix 40 mg bid and UOP >3liters. Pt breathing much better -d/c foley today -CXR shows pulmonary edema  2. history of MRSA sinusitis currently on vancomycin  -continue Vanco -s/p new PICC line placed on Oct 17th -pt has been on vanc since 02/02/16 and needs for 4 weeks 03/04/16 per Dr fitzgerald  3. Hypertension - continue Norvasc, Clonidine, Lasic, Isordil, lisinopril, metorpolol  5. History of hyperlipidemia - continue Pravastatin  6. History of hypothyroid - continue Synthroid  gen weakness-PT recommends HHPT CM for d/c planning-resume HH at home  D/c home CONSULTS OBTAINED:  Treatment Team:  Leonel Ramsay, MD  DRUG ALLERGIES:   Allergies  Allergen Reactions  . Dioxyline Nausea Only    Severe upset stomach with pain    DISCHARGE MEDICATIONS:   Current Discharge Medication List    START taking these medications   Details  vancomycin (VANCOCIN) 1-5 GM/200ML-% SOLN Inject 200 mLs (1,000 mg total) into the vein daily. Qty: 4000 mL, Refills: 0      CONTINUE these medications which have NOT CHANGED   Details  acetaminophen (TYLENOL) 650 MG CR tablet Take 650 mg by mouth every 8 (eight) hours as needed for pain.    albuterol (PROVENTIL HFA;VENTOLIN HFA) 108 (90 Base) MCG/ACT inhaler Inhale 2 puffs into the lungs every 6 (six) hours as needed for wheezing or shortness of breath.    amLODipine (NORVASC) 5 MG tablet Take 5 mg by mouth daily.    Cholecalciferol 1000 UNIT/10ML LIQD Take 1,000 Units by mouth daily.    cloNIDine (CATAPRES) 0.2 MG tablet Take 0.2 mg by mouth daily.    cyclobenzaprine (FLEXERIL) 5 MG tablet Take 5 mg by mouth daily.    furosemide (LASIX) 20 MG tablet Take 60 mg by mouth daily.    isosorbide dinitrate (ISORDIL) 20 MG tablet Take 20 mg by mouth 3 (three) times daily.    levothyroxine (SYNTHROID, LEVOTHROID) 150 MCG tablet Take 150 mcg by mouth daily before breakfast. Take on an empty stomach 30 to 60 minutes before breakfast    lisinopril (PRINIVIL,ZESTRIL) 20 MG tablet Take 20 mg by mouth daily.    metoprolol (LOPRESSOR) 100 MG tablet Take 100 mg by mouth daily.    neomycin-polymyxin-dexamethasone (MAXITROL) 0.1 % ophthalmic suspension Place into  both eyes 2 (two) times daily.    pravastatin (PRAVACHOL) 40 MG tablet Take 40 mg by mouth at bedtime.    traMADol-acetaminophen (ULTRACET) 37.5-325 MG tablet Take 1 tablet by mouth every 8 (eight) hours as needed for severe pain.      STOP taking these medications     traMADol (ULTRAM) 50 MG tablet         If you experience worsening of your admission symptoms, develop shortness of breath,  life threatening emergency, suicidal or homicidal thoughts you must seek medical attention immediately by calling 911 or calling your MD immediately  if symptoms less severe.  You Must read complete instructions/literature along with all the possible adverse reactions/side effects for all the Medicines you take and that have been prescribed to you. Take any new Medicines after you have completely understood and accept all the possible adverse reactions/side effects.   Please note  You were cared for by a hospitalist during your hospital stay. If you have any questions about your discharge medications or the care you received while you were in the hospital after you are discharged, you can call the unit and asked to speak with the hospitalist on call if the hospitalist that took care of you is not available. Once you are discharged, your primary care physician will handle any further medical issues. Please note that NO REFILLS for any discharge medications will be authorized once you are discharged, as it is imperative that you return to your primary care physician (or establish a relationship with a primary care physician if you do not have one) for your aftercare needs so that they can reassess your need for medications and monitor your lab values. Today   SUBJECTIVE  Doing well Breathing much improved  VITAL SIGNS:  Blood pressure (!) 161/71, pulse 69, temperature 97.4 F (36.3 C), temperature source Oral, resp. rate 18, height 5\' 1"  (1.549 m), weight 98.5 kg (217 lb 1 oz), SpO2 97 %.  I/O:    Intake/Output Summary (Last 24 hours) at 02/15/16 0938 Last data filed at 02/15/16 0536  Gross per 24 hour  Intake                0 ml  Output             3050 ml  Net            -3050 ml    PHYSICAL EXAMINATION:  GENERAL:  82 y.o.-year-old patient lying in the bed with no acute distress. obese EYES: Pupils equal, round, reactive to light and accommodation. No scleral icterus. Extraocular  muscles intact.  HEENT: Head atraumatic, normocephalic. Oropharynx and nasopharynx clear.  NECK:  Supple, no jugular venous distention. No thyroid enlargement, no tenderness.  LUNGS: Normal breath sounds bilaterally, no wheezing, rales,rhonchi or crepitation. No use of accessory muscles of respiration.  CARDIOVASCULAR: S1, S2 normal. No murmurs, rubs, or gallops.  ABDOMEN: Soft, non-tender, non-distended. Bowel sounds present. No organomegaly or mass.  EXTREMITIES: No pedal edema, cyanosis, or clubbing. PICC line ++ NEUROLOGIC: Cranial nerves II through XII are intact. Muscle strength 5/5 in all extremities. Sensation intact. Gait not checked.  PSYCHIATRIC:  patient is alert and oriented x 3.  SKIN: No obvious rash, lesion, or ulcer.   DATA REVIEW:   CBC   Recent Labs Lab 02/13/16 0025  WBC 18.9*  HGB 15.8  HCT 47.3*  PLT 151    Chemistries   Recent Labs Lab 02/13/16 0025 02/15/16 0335  NA 134*  136  K 3.5 4.0  CL 101 100*  CO2 25 28  GLUCOSE 135* 133*  BUN 16 32*  CREATININE 0.88 1.08*  CALCIUM 8.9 9.0  MG 1.7  --     Microbiology Results   Recent Results (from the past 240 hour(s))  Culture, blood (routine x 2)     Status: None (Preliminary result)   Collection Time: 02/13/16  1:08 AM  Result Value Ref Range Status   Specimen Description BLOOD LEFT ARM  Final   Special Requests   Final    BOTTLES DRAWN AEROBIC AND ANAEROBIC AER 1ML ANA 3ML   Culture NO GROWTH 2 DAYS  Final   Report Status PENDING  Incomplete  Culture, blood (routine x 2)     Status: None (Preliminary result)   Collection Time: 02/13/16  2:04 AM  Result Value Ref Range Status   Specimen Description BLOOD RIGHT ANTECUBITAL  Final   Special Requests BOTTLES DRAWN AEROBIC AND ANAEROBIC 6CC  Final   Culture NO GROWTH 2 DAYS  Final   Report Status PENDING  Incomplete  MRSA PCR Screening     Status: Abnormal   Collection Time: 02/13/16  5:23 AM  Result Value Ref Range Status   MRSA by PCR  POSITIVE (A) NEGATIVE Final    Comment:        The GeneXpert MRSA Assay (FDA approved for NASAL specimens only), is one component of a comprehensive MRSA colonization surveillance program. It is not intended to diagnose MRSA infection nor to guide or monitor treatment for MRSA infections. RESULT CALLED TO, READ BACK BY AND VERIFIED WITH: CYNTHIA BURGESS ON 02/13/16 AT 0645 BY [facility]     RADIOLOGY:  Dg Chest Port 1 View  Result Date: 02/13/2016 CLINICAL DATA:  PICC placement EXAM: PORTABLE CHEST 1 VIEW COMPARISON:  02/13/2016 FINDINGS: Right arm PICC tip in the proximal SVC. Diffuse bilateral airspace disease has improved. There remains asymmetric airspace disease on the right which has improved. No significant pleural effusion. IMPRESSION: Right arm PICC tip in the proximal SVC Improvement in bilateral pulmonary edema. Electronically Signed   By: Franchot Gallo M.D.   On: 02/13/2016 12:07     Management plans discussed with the patient, family and they are in agreement.  CODE STATUS:     Code Status Orders        Start     Ordered   02/13/16 0418  Full code  Continuous     02/13/16 0417    Code Status History    Date Active Date Inactive Code Status Order ID Comments User Context   This patient has a current code status but no historical code status.      TOTAL TIME TAKING CARE OF THIS PATIENT: 40 minutes.    Azalya Galyon M.D on 02/15/2016 at 9:38 AM  Between 7am to 6pm - Pager - [phone] After 6pm go to [email or web address] - password [email or web address] [facility]  Office  [phone]  CC: Primary care physician; Glendon Axe, MD

## 2016-02-15 NOTE — Progress Notes (Signed)
Pt is being discharged home with home health. Discharge papers given and explained to pt. Pt verbalized understanding. Meds and f/u appointments reviewed with pt. O2 tank in pt's room. Pt is discharged with PICC line in place per MD order.

## 2016-02-15 NOTE — Discharge Instructions (Signed)
HHPT °

## 2016-02-15 NOTE — Progress Notes (Signed)
SATURATION QUALIFICATIONS: (This note is used to comply with regulatory documentation for home oxygen) ° °Patient Saturations on Room Air at Rest = 88% ° °Patient Saturations on Room Air while Ambulating = 87% ° °Patient Saturations on 2 Liters of oxygen while Ambulating = 92% ° °Please briefly explain why patient needs home oxygen: ° °COPD °

## 2016-02-15 NOTE — Progress Notes (Signed)
Pharmacy Antibiotic Note  Lisa Haney is a 82 y.o. female admitted on 02/12/2016 with ear/sinus infection and sepsis.  Pharmacy has been consulted for vancomycin dosing  Plan: Pt has been receiving vanc outpt via PICC due to MRSA ear/sinus infection. Pt receiving vancomycin 1000 mg IV q 24 hours.  ABW = 68kg; IBW CrCl = 34.6 ml/min; ke=0.033 VD=47.6 t1/2 21 hours. Estimated trough Css = 18.8  10/16 PM vanc level 16. Vancomycin 1 gram q 24 hours initiated. Level ordered before 4th dose in house.  10/18: per MD,  pt plan is to d/c and home health will get vanc trough tomorrow and follow due to decreasing renal function  Height: 5\' 1"  (154.9 cm) Weight: 217 lb 1 oz (98.5 kg) IBW/kg (Calculated) : 47.8  Temp (24hrs), Avg:97.8 F (36.6 C), Min:97.4 F (36.3 C), Max:98.6 F (37 C)   Recent Labs Lab 02/13/16 0025 02/13/16 0108 02/13/16 2141 02/15/16 0335  WBC 18.9*  --   --   --   CREATININE 0.88  --   --  1.08*  LATICACIDVEN  --  1.3  --   --   VANCORANDOM  --   --  16  --     Estimated Creatinine Clearance: 40.2 mL/min (by C-G formula based on SCr of 1.08 mg/dL (H)).    Allergies  Allergen Reactions  . Dioxyline Nausea Only    Severe upset stomach with pain    Antimicrobials this admission: Vanc 10/15 >> Zosyn 10/16 >> 10/16  Dose adjustments this admission: Zosyn 4.5 -> 3.375  Microbiology results: 10/16 BCx: pending: no growth 2 days 10/16 UCx: sent 10/16 Sputum: sent 10/16 MRSA PCR: positive  Thank you for allowing pharmacy to be a part of this patient's care.  Darrow Bussing, PharmD Pharmacy Resident 02/15/2016 5:15 PM

## 2016-02-18 LAB — CULTURE, BLOOD (ROUTINE X 2)
Culture: NO GROWTH
Culture: NO GROWTH

## 2016-05-27 ENCOUNTER — Emergency Department
Admission: EM | Admit: 2016-05-27 | Discharge: 2016-05-27 | Disposition: A | Discharge status: Home / Self Care | Payer: Medicare Other | Attending: Emergency Medicine | Admitting: Emergency Medicine

## 2016-05-27 ENCOUNTER — Encounter: Payer: Self-pay | Admitting: Emergency Medicine

## 2016-05-27 DIAGNOSIS — Z87891 Personal history of nicotine dependence: Secondary | ICD-10-CM | POA: Insufficient documentation

## 2016-05-27 DIAGNOSIS — Z79899 Other long term (current) drug therapy: Secondary | ICD-10-CM | POA: Diagnosis not present

## 2016-05-27 DIAGNOSIS — K625 Hemorrhage of anus and rectum: Secondary | ICD-10-CM | POA: Diagnosis present

## 2016-05-27 DIAGNOSIS — I10 Essential (primary) hypertension: Secondary | ICD-10-CM | POA: Insufficient documentation

## 2016-05-27 DIAGNOSIS — J441 Chronic obstructive pulmonary disease with (acute) exacerbation: Secondary | ICD-10-CM | POA: Insufficient documentation

## 2016-05-27 DIAGNOSIS — K644 Residual hemorrhoidal skin tags: Secondary | ICD-10-CM | POA: Insufficient documentation

## 2016-05-27 LAB — COMPREHENSIVE METABOLIC PANEL
ALBUMIN: 3.3 g/dL — AB (ref 3.5–5.0)
ALK PHOS: 122 U/L (ref 38–126)
ALT: 16 U/L (ref 14–54)
AST: 25 U/L (ref 15–41)
Anion gap: 6 (ref 5–15)
BUN: 21 mg/dL — ABNORMAL HIGH (ref 6–20)
CALCIUM: 9.2 mg/dL (ref 8.9–10.3)
CO2: 29 mmol/L (ref 22–32)
CREATININE: 1.17 mg/dL — AB (ref 0.44–1.00)
Chloride: 99 mmol/L — ABNORMAL LOW (ref 101–111)
GFR calc Af Amer: 47 mL/min — ABNORMAL LOW (ref >60)
GFR calc non Af Amer: 41 mL/min — ABNORMAL LOW (ref >60)
GLUCOSE: 107 mg/dL — AB (ref 65–99)
Potassium: 3.8 mmol/L (ref 3.5–5.1)
SODIUM: 134 mmol/L — AB (ref 135–145)
Total Bilirubin: 0.6 mg/dL (ref 0.3–1.2)
Total Protein: 7.5 g/dL (ref 6.5–8.1)

## 2016-05-27 LAB — CBC
HCT: 38.6 % (ref 35.0–47.0)
HCT: 40.4 % (ref 35.0–47.0)
Hemoglobin: 13.3 g/dL (ref 12.0–16.0)
Hemoglobin: 13.6 g/dL (ref 12.0–16.0)
MCH: 29.9 pg (ref 26.0–34.0)
MCH: 29.1 pg (ref 26.0–34.0)
MCHC: 34.5 g/dL (ref 32.0–36.0)
MCHC: 33.6 g/dL (ref 32.0–36.0)
MCV: 86.7 fL (ref 80.0–100.0)
MCV: 86.7 fL (ref 80.0–100.0)
PLATELETS: 246 10*3/uL (ref 150–440)
PLATELETS: 230 10*3/uL (ref 150–440)
RBC: 4.45 MIL/uL (ref 3.80–5.20)
RBC: 4.66 MIL/uL (ref 3.80–5.20)
RDW: 14.7 % — ABNORMAL HIGH (ref 11.5–14.5)
RDW: 14.6 % — AB (ref 11.5–14.5)
WBC: 10.1 10*3/uL (ref 3.6–11.0)
WBC: 9.8 10*3/uL (ref 3.6–11.0)

## 2016-05-27 LAB — TYPE AND SCREEN
ABO/RH(D): A POS
Antibody Screen: NEGATIVE

## 2016-05-27 NOTE — ED Notes (Signed)
Hemoccult ready at bedside Clicked in error

## 2016-05-27 NOTE — ED Notes (Signed)
This RN to bedside to apologize for delay. Pt states MD to bedside and performed rectal exam with female RN. PT states +blood. This RN explained that she would be signing off to night shift. Pt states understanding.

## 2016-05-27 NOTE — ED Notes (Signed)
This RN to bedside to apologize for delay in patient care, explained that this RN and MD had critical situation. Pt and family state understanding. BP cuff replaced into proper position and O2 sensor applied to finger.  No change in patient condition, pt remains on 2L O2 at this time. Will continue to monitor for further patient needs.

## 2016-05-27 NOTE — ED Notes (Signed)
This RN to bedside at this time to apologize and explain delay, and introduce self to patient and SO. Pt states understanding at this time. This RN placed patient on 2L of O2. Pt states that she started wearing oxygen at night but has progressed to needing it during the day. Pt is alert and oriented at this time.

## 2016-05-27 NOTE — ED Provider Notes (Signed)
[facility] Emergency Department Provider Note   ____________________________________________   First MD Initiated Contact with Patient 05/27/16 1623     (approximate)  I have reviewed the triage vital signs and the nursing notes.   HISTORY  Chief Complaint Rectal Bleeding    HPI Lisa Haney is a 83 y.o. female here for evaluation of blood in her stool.  Patient reports over the last 2-3 days she has noticed small amount of blood on tissue paper after wiping. She reports that she's had pain in the right lower abdomen but this is been an ongoing problem for over a year, and doctors have not yet diagnosed clear cause.  No fevers or chills. No nausea or vomiting. Denies diarrhea. Reports having one bowel movement daily with a small amount of blood notably on the tissue paper and a tiny amount in the toilet. No heavy or major amounts of bleeding. She reports a history of diverticulosis or diverticulitis, has been told she's had small amounts of bleeding from that.  Her last colonoscopy was over 10 years ago  No fevers no chills. No chest pain or shortness of breath. No weakness or lightheadedness except for chronic weakness Past Medical History:  Diagnosis Date  . Cancer ([place])   . COPD (chronic obstructive pulmonary disease) ([place])   . Diverticulitis   . Hypertension   . TIA (transient ischemic attack)     Patient Active Problem List   Diagnosis Date Noted  . COPD exacerbation ([place]) 02/13/2016    Past Surgical History:  Procedure Laterality Date  . ABDOMINAL HYSTERECTOMY    . CAROTID STENT    . CHOLECYSTECTOMY    . DILATION AND CURETTAGE OF UTERUS    . NASAL SINUS SURGERY    . OVARIAN CYST REMOVAL    . REPLACEMENT TOTAL KNEE Right   . TONSILLECTOMY      Prior to Admission medications   Medication Sig Start Date End Date Taking? Authorizing Provider  acetaminophen (TYLENOL) 650 MG CR tablet Take 650 mg by mouth every 8 (eight) hours as  needed for pain.    Historical Provider, MD  albuterol (PROVENTIL HFA;VENTOLIN HFA) 108 (90 Base) MCG/ACT inhaler Inhale 2 puffs into the lungs every 6 (six) hours as needed for wheezing or shortness of breath.    Historical Provider, MD  amLODipine (NORVASC) 5 MG tablet Take 5 mg by mouth daily.    Historical Provider, MD  Cholecalciferol 1000 UNIT/10ML LIQD Take 1,000 Units by mouth daily.    Historical Provider, MD  cloNIDine (CATAPRES) 0.2 MG tablet Take 0.2 mg by mouth daily.    Historical Provider, MD  cyclobenzaprine (FLEXERIL) 5 MG tablet Take 5 mg by mouth daily.    Historical Provider, MD  furosemide (LASIX) 20 MG tablet Take 60 mg by mouth daily.    Historical Provider, MD  isosorbide dinitrate (ISORDIL) 20 MG tablet Take 20 mg by mouth 3 (three) times daily.    Historical Provider, MD  levothyroxine (SYNTHROID, LEVOTHROID) 150 MCG tablet Take 150 mcg by mouth daily before breakfast. Take on an empty stomach 30 to 60 minutes before breakfast    Historical Provider, MD  lisinopril (PRINIVIL,ZESTRIL) 20 MG tablet Take 20 mg by mouth daily.    Historical Provider, MD  metoprolol (LOPRESSOR) 100 MG tablet Take 100 mg by mouth daily.    Historical Provider, MD  neomycin-polymyxin-dexamethasone (MAXITROL) 0.1 % ophthalmic suspension Place into both eyes 2 (two) times daily.    Historical Provider,  MD  pravastatin (PRAVACHOL) 40 MG tablet Take 40 mg by mouth at bedtime.    Historical Provider, MD  traMADol-acetaminophen (ULTRACET) 37.5-325 MG tablet Take 1 tablet by mouth every 8 (eight) hours as needed for severe pain.    Historical Provider, MD  vancomycin (VANCOCIN) 1-5 GM/200ML-% SOLN Inject 200 mLs (1,000 mg total) into the vein daily. 02/15/16   Fritzi Mandes, MD    Allergies Dioxyline  History reviewed. No pertinent family history.  Social History Social History  Substance Use Topics  . Smoking status: Former Research scientist (life sciences)  . Smokeless tobacco: Never Used  . Alcohol use No    Review of  Systems Constitutional: No fever/chills Eyes: No visual changes. ENT: No sore throat. Cardiovascular: Denies chest pain. Respiratory: Denies shortness of breath. Gastrointestinal: See history of present illness  No nausea, no vomiting.  No diarrhea.  No constipation. Genitourinary: Negative for dysuria. Musculoskeletal: Negative for back pain. Skin: Negative for rash. Neurological: Negative for headaches, focal weakness or numbness.  10-point ROS otherwise negative.  ____________________________________________   PHYSICAL EXAM:  VITAL SIGNS: ED Triage Vitals  Enc Vitals Group     BP 05/27/16 1403 (!) 130/46     Pulse Rate 05/27/16 1354 65     Resp 05/27/16 1354 16     Temp 05/27/16 1354 98.1 F (36.7 C)     Temp Source 05/27/16 1354 Oral     SpO2 05/27/16 1354 97 %     Weight 05/27/16 1404 206 lb (93.4 kg)     Height 05/27/16 1404 5\' 5"  (1.651 m)     Head Circumference --      Peak Flow --      Pain Score 05/27/16 1404 3     Pain Loc --      Pain Edu? --      Excl. in [place]? --     Constitutional: Alert and oriented. Well appearing and in no acute distress.Very pleasant, 2 of the patient's adult sons present. Eyes: Conjunctivae are normal. PERRL. EOMI. Head: Atraumatic. Nose: No congestion/rhinnorhea. Mouth/Throat: Mucous membranes are moist.  Oropharynx non-erythematous. Neck: No stridor.   Cardiovascular: Normal rate, regular rhythm. Grossly normal heart sounds.  Good peripheral circulation. Respiratory: Normal respiratory effort.  No retractions. Lungs CTAB. Gastrointestinal: Soft and nontender. No distention.  Rectal exam escorted by Marcie Bal: Small external hemorrhoid is noted which is not obviously bleeding. Internal examination reveals brown formed stool, on guaiac there is a faint positive. No gross blood Musculoskeletal: No lower extremity tenderness nor edema.  No joint effusions. Neurologic:  Normal speech and language. No gross focal neurologic deficits are  appreciated. Skin:  Skin is warm, dry and intact. No rash noted. Psychiatric: Mood and affect are normal. Speech and behavior are normal.  ____________________________________________   LABS (all labs ordered are listed, but only abnormal results are displayed)  Labs Reviewed  COMPREHENSIVE METABOLIC PANEL - Abnormal; Notable for the following:       Result Value   Sodium 134 (*)    Chloride 99 (*)    Glucose, Bld 107 (*)    BUN 21 (*)    Creatinine, Ser 1.17 (*)    Albumin 3.3 (*)    GFR calc non Af Amer 41 (*)    GFR calc Af Amer 47 (*)    All other components within normal limits  CBC - Abnormal; Notable for the following:    RDW 14.7 (*)    All other components within normal limits  CBC -  Abnormal; Notable for the following:    RDW 14.6 (*)    All other components within normal limits  POC OCCULT BLOOD, ED  TYPE AND SCREEN   ____________________________________________  EKG   ____________________________________________  RADIOLOGY   ____________________________________________   PROCEDURES  Procedure(s) performed: None  Procedures  Critical Care performed: No  ____________________________________________   INITIAL IMPRESSION / ASSESSMENT AND PLAN / ED COURSE  Pertinent labs & imaging results that were available during my care of the patient were reviewed by me and considered in my medical decision making (see chart for details).  Painless rectal bleeding. History of diverticulitis and, from what the patient describes also possibly some diverticular bleeding the past but has not had a colonoscopy in over 10 years.  Today she has stable vital signs, no systemic symptoms, and her hemoglobin is normal. She does have a faintly positive guaiac, but the etiology is not clear. She has no associated symptoms suggest acute diverticulitis or an obvious benefit from CT imaging. She is afebrile, no leukocytosis, with reassuring examination.  Discussed with the  patient, and I will check a repeat hemoglobin for stability here in the ER, if stable plan to discharge the patient with careful return precautions and a recommendation for close primary and GI follow-up  ----------------------------------------- 8:01 PM on 05/27/2016 -----------------------------------------  No further bleeding, no further stools here in the emergency room. Patient seemed globe and stable on repeat checks several hours apart. The patient appears stable for discharge, does not currently take any blood thinners or aspirin-containing products. Careful return precautions discussed with the patient and her sons at the bedside, were all in agreement with the plan for close follow-up with primary care doctor and scheduling follow-up with gastroenterologist  Return precautions and treatment recommendations and follow-up discussed with the patient who is agreeable with the plan.       ____________________________________________   FINAL CLINICAL IMPRESSION(S) / ED DIAGNOSES  Final diagnoses:  Rectal bleeding      NEW MEDICATIONS STARTED DURING THIS VISIT:  New Prescriptions   No medications on file     Note:  This document was prepared using Dragon voice recognition software and may include unintentional dictation errors.     Delman Kitten, MD 05/27/16 2002

## 2016-05-27 NOTE — ED Notes (Signed)
Pt assisted to the use of a bedpan. 

## 2016-05-27 NOTE — Discharge Instructions (Signed)
You were seen in the emergency room for blood in your stool and you require close follow-up with your primary care doctor and gastroenterology as soon as possible.  Please return to the emergency room right away if you are to develop a fever, severe nausea, you experience more blood in in your stool or heavy rectal bleeding, increased your pain becomes severe or worsens, you are unable to keep food down, begin vomiting any dark or bloody fluid, you develop any dark or bloody stools, feel dehydrated, or other new concerns or symptoms arise.  Do not take any aspirin or "blood thinners" until cleared to do so by your primary care doctor.

## 2016-05-27 NOTE — ED Triage Notes (Signed)
Pt presents with rectal bleeding x 3 days. She states she has diverticulitis and when she has had the bleeding in the past it has resolved after a couple of days. Pt also reports abdominal pain on right side, both upper and lower quadrants. Pt alert & oriented with NAD noted.

## 2016-06-21 ENCOUNTER — Encounter
Admission: RE | Disposition: A | Discharge status: Home / Self Care | Payer: Self-pay | Source: Ambulatory Visit | Attending: Cardiology

## 2016-06-21 ENCOUNTER — Encounter: Payer: Self-pay | Admitting: *Deleted

## 2016-06-21 ENCOUNTER — Ambulatory Visit
Admission: RE | Admit: 2016-06-21 | Discharge: 2016-06-21 | Disposition: A | Discharge status: Home / Self Care | Payer: Medicare Other | Source: Ambulatory Visit | Attending: Cardiology | Admitting: Cardiology

## 2016-06-21 DIAGNOSIS — Z8614 Personal history of Methicillin resistant Staphylococcus aureus infection: Secondary | ICD-10-CM | POA: Diagnosis not present

## 2016-06-21 DIAGNOSIS — I5032 Chronic diastolic (congestive) heart failure: Secondary | ICD-10-CM | POA: Diagnosis not present

## 2016-06-21 DIAGNOSIS — E785 Hyperlipidemia, unspecified: Secondary | ICD-10-CM | POA: Diagnosis not present

## 2016-06-21 DIAGNOSIS — J449 Chronic obstructive pulmonary disease, unspecified: Secondary | ICD-10-CM | POA: Insufficient documentation

## 2016-06-21 DIAGNOSIS — G4733 Obstructive sleep apnea (adult) (pediatric): Secondary | ICD-10-CM | POA: Diagnosis not present

## 2016-06-21 DIAGNOSIS — I110 Hypertensive heart disease with heart failure: Secondary | ICD-10-CM | POA: Diagnosis not present

## 2016-06-21 DIAGNOSIS — I272 Pulmonary hypertension, unspecified: Secondary | ICD-10-CM | POA: Insufficient documentation

## 2016-06-21 DIAGNOSIS — Z8673 Personal history of transient ischemic attack (TIA), and cerebral infarction without residual deficits: Secondary | ICD-10-CM | POA: Insufficient documentation

## 2016-06-21 DIAGNOSIS — E039 Hypothyroidism, unspecified: Secondary | ICD-10-CM | POA: Insufficient documentation

## 2016-06-21 HISTORY — DX: Unspecified osteoarthritis, unspecified site: M19.90

## 2016-06-21 HISTORY — DX: Atherosclerotic heart disease of native coronary artery without angina pectoris: I25.10

## 2016-06-21 HISTORY — DX: Cerebral infarction, unspecified: I63.9

## 2016-06-21 SURGERY — RIGHT HEART CATH AND CORONARY ANGIOGRAPHY
Anesthesia: Moderate Sedation | Laterality: Right

## 2016-06-21 MED ORDER — FENTANYL CITRATE (PF) 100 MCG/2ML IJ SOLN
INTRAMUSCULAR | Status: AC
  Filled 2016-06-21: qty 2

## 2016-06-21 MED ORDER — SODIUM CHLORIDE 0.9% FLUSH: 3.0000 mL | INTRAVENOUS | Status: DC | PRN

## 2016-06-21 MED ORDER — SODIUM CHLORIDE 0.9 % IV SOLN
250.0000 mL | INTRAVENOUS | Status: DC | PRN
Start: 2016-06-21 — End: 2016-06-21

## 2016-06-21 MED ORDER — ACETAMINOPHEN 325 MG PO TABS: 650.0000 mg | ORAL_TABLET | ORAL | Status: DC | PRN

## 2016-06-21 MED ORDER — SODIUM CHLORIDE 0.9 % WEIGHT BASED INFUSION: 3.0000 mL/kg/h | INTRAVENOUS | Status: DC

## 2016-06-21 MED ORDER — SODIUM CHLORIDE 0.9 % WEIGHT BASED INFUSION: 1.0000 mL/kg/h | INTRAVENOUS | Status: DC

## 2016-06-21 MED ORDER — SODIUM CHLORIDE 0.9 % IV SOLN: 250.0000 mL | INTRAVENOUS | Status: DC | PRN

## 2016-06-21 MED ORDER — MIDAZOLAM HCL 2 MG/2ML IJ SOLN
INTRAMUSCULAR | Status: AC
  Filled 2016-06-21: qty 2

## 2016-06-21 MED ORDER — HEPARIN (PORCINE) IN NACL 2-0.9 UNIT/ML-% IJ SOLN
INTRAMUSCULAR | Status: AC
  Filled 2016-06-21: qty 500

## 2016-06-21 MED ORDER — SODIUM CHLORIDE 0.9% FLUSH: 3.0000 mL | Freq: Two times a day (BID) | INTRAVENOUS | Status: DC

## 2016-06-21 MED ORDER — ONDANSETRON HCL 4 MG/2ML IJ SOLN: 4.0000 mg | Freq: Four times a day (QID) | INTRAMUSCULAR | Status: DC | PRN

## 2016-06-21 MED ORDER — ASPIRIN 81 MG PO CHEW: 81.0000 mg | CHEWABLE_TABLET | ORAL | Status: DC

## 2016-06-21 SURGICAL SUPPLY — 5 items
CATH SWANZ 7F THERMO (CATHETERS) ×3 IMPLANT
KIT RIGHT HEART (MISCELLANEOUS) ×3 IMPLANT
NEEDLE PERC 18GX7CM (NEEDLE) ×3 IMPLANT
PACK CARDIAC CATH (CUSTOM PROCEDURE TRAY) ×3 IMPLANT
SHEATH PINNACLE 7F 10CM (SHEATH) ×3 IMPLANT

## 2017-04-28 ENCOUNTER — Encounter: Payer: Self-pay | Admitting: Emergency Medicine

## 2017-04-28 ENCOUNTER — Other Ambulatory Visit: Payer: Self-pay

## 2017-04-28 ENCOUNTER — Emergency Department
Admission: EM | Admit: 2017-04-28 | Discharge: 2017-04-28 | Disposition: A | Discharge status: Home / Self Care | Payer: Medicare Other | Attending: Emergency Medicine | Admitting: Emergency Medicine

## 2017-04-28 ENCOUNTER — Emergency Department: Payer: Medicare Other

## 2017-04-28 DIAGNOSIS — I10 Essential (primary) hypertension: Secondary | ICD-10-CM | POA: Insufficient documentation

## 2017-04-28 DIAGNOSIS — J449 Chronic obstructive pulmonary disease, unspecified: Secondary | ICD-10-CM | POA: Insufficient documentation

## 2017-04-28 DIAGNOSIS — Z9049 Acquired absence of other specified parts of digestive tract: Secondary | ICD-10-CM | POA: Insufficient documentation

## 2017-04-28 DIAGNOSIS — J209 Acute bronchitis, unspecified: Secondary | ICD-10-CM

## 2017-04-28 DIAGNOSIS — E860 Dehydration: Secondary | ICD-10-CM | POA: Diagnosis not present

## 2017-04-28 DIAGNOSIS — Z8673 Personal history of transient ischemic attack (TIA), and cerebral infarction without residual deficits: Secondary | ICD-10-CM | POA: Diagnosis not present

## 2017-04-28 DIAGNOSIS — Z85828 Personal history of other malignant neoplasm of skin: Secondary | ICD-10-CM | POA: Diagnosis not present

## 2017-04-28 DIAGNOSIS — R05 Cough: Secondary | ICD-10-CM | POA: Diagnosis present

## 2017-04-28 DIAGNOSIS — Z79899 Other long term (current) drug therapy: Secondary | ICD-10-CM | POA: Insufficient documentation

## 2017-04-28 DIAGNOSIS — I2510 Atherosclerotic heart disease of native coronary artery without angina pectoris: Secondary | ICD-10-CM | POA: Insufficient documentation

## 2017-04-28 DIAGNOSIS — Z96651 Presence of right artificial knee joint: Secondary | ICD-10-CM | POA: Diagnosis not present

## 2017-04-28 DIAGNOSIS — D696 Thrombocytopenia, unspecified: Secondary | ICD-10-CM | POA: Diagnosis not present

## 2017-04-28 LAB — CBC WITH DIFFERENTIAL/PLATELET
BASOS PCT: 1 %
Basophils Absolute: 0 10*3/uL (ref 0–0.1)
EOS ABS: 0 10*3/uL (ref 0–0.7)
Eosinophils Relative: 1 %
HCT: 41.7 % (ref 35.0–47.0)
HEMOGLOBIN: 13.9 g/dL (ref 12.0–16.0)
Lymphocytes Relative: 29 %
Lymphs Abs: 1.1 10*3/uL (ref 1.0–3.6)
MCH: 29.3 pg (ref 26.0–34.0)
MCHC: 33.5 g/dL (ref 32.0–36.0)
MCV: 87.5 fL (ref 80.0–100.0)
Monocytes Absolute: 0.6 10*3/uL (ref 0.2–0.9)
Monocytes Relative: 17 %
NEUTROS PCT: 52 %
Neutro Abs: 1.9 10*3/uL (ref 1.4–6.5)
Platelets: 88 10*3/uL — ABNORMAL LOW (ref 150–440)
RBC: 4.76 MIL/uL (ref 3.80–5.20)
RDW: 15 % — ABNORMAL HIGH (ref 11.5–14.5)
WBC: 3.6 10*3/uL (ref 3.6–11.0)

## 2017-04-28 LAB — BASIC METABOLIC PANEL
Anion gap: 8 (ref 5–15)
BUN: 27 mg/dL — ABNORMAL HIGH (ref 6–20)
CHLORIDE: 95 mmol/L — AB (ref 101–111)
CO2: 29 mmol/L (ref 22–32)
Calcium: 8.5 mg/dL — ABNORMAL LOW (ref 8.9–10.3)
Creatinine, Ser: 1.13 mg/dL — ABNORMAL HIGH (ref 0.44–1.00)
GFR calc non Af Amer: 42 mL/min — ABNORMAL LOW (ref >60)
GFR, EST AFRICAN AMERICAN: 49 mL/min — AB (ref >60)
Glucose, Bld: 84 mg/dL (ref 65–99)
POTASSIUM: 4.4 mmol/L (ref 3.5–5.1)
SODIUM: 132 mmol/L — AB (ref 135–145)

## 2017-04-28 LAB — TROPONIN I

## 2017-04-28 MED ORDER — PREDNISONE 20 MG PO TABS: 60.0000 mg | ORAL_TABLET | Freq: Every day | ORAL | 0 refills | Status: AC

## 2017-04-28 MED ORDER — AMOXICILLIN-POT CLAVULANATE 875-125 MG PO TABS
1.0000 | ORAL_TABLET | Freq: Two times a day (BID) | ORAL | 0 refills | Status: DC
Start: 2017-04-28 — End: 2017-05-04

## 2017-04-28 MED ORDER — METHYLPREDNISOLONE SODIUM SUCC 125 MG IJ SOLR
125.0000 mg | Freq: Once | INTRAMUSCULAR | Status: AC
  Administered 2017-04-28: 125 mg via INTRAVENOUS
  Filled 2017-04-28: qty 2

## 2017-04-28 MED ORDER — ONDANSETRON 4 MG PO TBDP: 4.0000 mg | ORAL_TABLET | Freq: Three times a day (TID) | ORAL | 0 refills | Status: DC | PRN

## 2017-04-28 MED ORDER — ONDANSETRON HCL 4 MG/2ML IJ SOLN
4.0000 mg | Freq: Once | INTRAMUSCULAR | Status: AC
  Administered 2017-04-28: 4 mg via INTRAVENOUS
  Filled 2017-04-28: qty 2

## 2017-04-28 MED ORDER — ALBUTEROL SULFATE HFA 108 (90 BASE) MCG/ACT IN AERS: 2.0000 | INHALATION_SPRAY | Freq: Four times a day (QID) | RESPIRATORY_TRACT | 1 refills | Status: DC | PRN

## 2017-04-28 MED ORDER — AMOXICILLIN-POT CLAVULANATE 875-125 MG PO TABS
1.0000 | ORAL_TABLET | Freq: Once | ORAL | Status: AC
  Administered 2017-04-28: 1 via ORAL
  Filled 2017-04-28: qty 1

## 2017-04-28 MED ORDER — IPRATROPIUM-ALBUTEROL 0.5-2.5 (3) MG/3ML IN SOLN
3.0000 mL | Freq: Once | RESPIRATORY_TRACT | Status: AC
  Administered 2017-04-28: 3 mL via RESPIRATORY_TRACT
  Filled 2017-04-28: qty 3

## 2017-04-28 MED ORDER — SODIUM CHLORIDE 0.9 % IV BOLUS (SEPSIS)
500.0000 mL | Freq: Once | INTRAVENOUS | Status: AC
  Administered 2017-04-28: 500 mL via INTRAVENOUS

## 2017-04-28 NOTE — ED Triage Notes (Signed)
Pt c/o congestion and cough x 3 days. vss and non-febrile.

## 2017-04-28 NOTE — Discharge Instructions (Signed)
You were seen today in the Emergency Department (ED) and was diagnosed with a COPD exacerbation. Chronic obstructive pulmonary disease (COPD) is a general term for a group of lung diseases, including emphysema and chronic bronchitis. People with COPD have decreased airflow in and out of the lungs, which makes it hard to breathe. The airways also can get clogged with thick mucus. Cigarette smoking is a major cause of COPD.   Although there is no cure for COPD, you can slow its progress. Following your treatment plan and taking care of yourself can help you feel better and live longer.   Use your albuterol inhaler 2 puffs every 4 hour as needed for shortness of breath, wheezing, or cough. Take steroids as prescribed. Take antibiotics as prescribed. Follow up with your doctor in a couple of days for evaluation of your low platelets  Follow-up with your doctor in 1 day for re-evaluation.   When should you call for help?  Call 911 anytime you think you may need emergency care. For example, call if:  You have severe trouble breathing.  You have severe chest pain. Call your doctor now or seek immediate medical care if:  You have new or worse shortness of breath.  You develop new chest pain.  You are coughing more deeply or more often, especially if you notice more mucus or a change in the color of your mucus.  You cough up blood.  You have new or increased swelling in your legs or belly.  You have a fever. Watch closely for changes in your health, and be sure to contact your doctor if:  You use your antibiotic prescription.  Your symptoms are getting worse   How can you care for yourself at home?   During an exacerbation  Do not panic if you start to have one. Quick treatment at home may help you prevent serious breathing problems. If you have a COPD exacerbation plan that you developed with your doctor, follow it.  Take your medicines exactly as your doctor tells you.  Use your inhaler as  directed by your doctor. If your symptoms do not get better after you use your medicine, have someone take you to the emergency room. Call an ambulance if necessary.  With inhaled medicines, a spacer or a nebulizer may help you get more medicine to your lungs. Ask your doctor or pharmacist how to use them properly. Practice using the spacer in front of a mirror before you have an exacerbation. This may help you get the medicine into your lungs quickly.  If your doctor has given you steroid pills, take them as directed.  Your doctor may have given you a prescription for antibiotics, which you are to fill if you need it. Call your doctor if you use the prescription.  Talk to your doctor if you have any problems with your medicine.  Preventing an exacerbation  Do not smoke. This is the most important step you can take to prevent more damage to your lungs and prevent problems. If you already smoke, it is never too late to stop. If you need help quitting, talk to your doctor about stop-smoking programs and medicines. These can increase your chances of quitting for good.  Take your daily medicines as prescribed.  Avoid colds and flu.  Get a pneumococcal vaccine.  Get a flu vaccine each year, as soon as it is available. Ask those you live or work with to do the same, so they will not get the  flu and infect you.  Try to stay away from people with colds or the flu.  Wash your hands often. Avoid secondhand smoke; air pollution; cold, dry air; hot, humid air; and high altitudes. Stay at home with your windows closed when air pollution is bad.  Learn breathing techniques for COPD, such as breathing through pursed lips. These techniques can help you breathe easier during an exacerbation.  Staying healthy  Do not smoke. This is the most important step you can take to prevent more damage to your lungs. If you need help quitting, talk to your doctor about stop-smoking programs and medicines. These can increase  your chances of quitting for good.  Avoid colds and flu. Get a pneumococcal vaccine shot. If you have had one before, ask your doctor whether you need a second dose. Get the flu vaccine every fall. If you must be around people with colds or the flu, wash your hands often.  Avoid secondhand smoke, air pollution, and high altitudes. Also avoid cold, dry air and hot, humid air. Stay at home with your windows closed when air pollution is bad.  Medicines and oxygen therapy  Take your medicines exactly as prescribed. Call your doctor if you think you are having a problem with your medicine.  You may be taking medicines such as:  Bronchodilators. These help open your airways and make breathing easier. Bronchodilators are either short-acting (work for 6 to 9 hours) or long-acting (work for 24 hours). You inhale most bronchodilators, so they start to act quickly. Always carry your quick-relief inhaler with you in case you need it while you are away from home.  Corticosteroids (prednisone, budesonide). These reduce airway inflammation. They come in pill or inhaled form. You must take these medicines every day for them to work well. A spacer may help you get more inhaled medicine to your lungs. Ask your doctor or pharmacist if a spacer is right for you. If it is, ask how to use it properly.  Do not take any vitamins, over-the-counter medicine, or herbal products without talking to your doctor first.  If your doctor prescribed antibiotics, take them as directed. Do not stop taking them just because you feel better. You need to take the full course of antibiotics.  Oxygen therapy boosts the amount of oxygen in your blood and helps you breathe easier. Use the flow rate your doctor has recommended, and do not change it without talking to your doctor first.  Activity  Get regular exercise. Walking is an easy way to get exercise. Start out slowly, and walk a little more each day.  Pay attention to your breathing. You  are exercising too hard if you cannot talk while you are exercising.  Take short rest breaks when doing household chores and other activities.  Learn breathing methods--such as breathing through pursed lips--to help you become less short of breath.  If your doctor has not set you up with a pulmonary rehabilitation program, talk to him or her about whether rehab is right for you. Rehab includes exercise programs, education about your disease and how to manage it, help with diet and other changes, and emotional support.  Diet  Eat regular, healthy meals. Use bronchodilators about 1 hour before you eat to make it easier to eat. Eat several small meals instead of three large ones. Drink beverages at the end of the meal. Avoid foods that are hard to chew.  Eat foods that contain fat and protein so that you do not  lose weight and muscle mass. These foods include ice cream, pudding, cheese, eggs, and peanut butter.  Use less salt. Too much salt can cause you to retain fluids, which makes it harder to breathe. Do not add salt while you are cooking or at the table. Eat fewer processed foods and foods from restaurants, including fast foods. Use fresh or frozen foods instead of canned foods.  Mental health  Talk to your family, friends, or a therapist about your feelings. It is normal to feel frightened, angry, hopeless, helpless, and even guilty. Talking openly about bad feelings can help you cope. If these feelings last, talk to your doctor.

## 2017-04-28 NOTE — ED Provider Notes (Signed)
[facility] Emergency Department Provider Note  ____________________________________________  Time seen: Approximately 3:15 PM  I have reviewed the triage vital signs and the nursing notes.   HISTORY  Chief Complaint Nasal Congestion and Cough   HPI Lisa Haney is a 83 y.o. female with h/o COPD on 2L [place], CAD, hypertension who presents for evaluation of cough, congestion, and generalized weakness. Patient reports that her symptoms have been ongoing for the last 2 days. She has had worsening shortness of breath especially with exertion when walking at home for the last 2 days. No SOB at rest. She has had a productive cough however unable to bring up any sputum. According to sons patient has had generalized weakness for the last week. She has had normal appetite. Her symptoms have been getting progressively worse. No fever or chills. She has had nausea but no vomiting or diarrhea. No abdominal pain, no chest pain, no sore throat, no dysuria.  Past Medical History:  Diagnosis Date  . Arthritis   . Cancer ([place])    skin cancer on hand  . COPD (chronic obstructive pulmonary disease) ([place])   . Coronary artery disease    stent  . Diverticulitis   . Hypertension   . Stroke [facility])    TIA  . TIA (transient ischemic attack)     Patient Active Problem List   Diagnosis Date Noted  . COPD exacerbation ([place]) 02/13/2016    Past Surgical History:  Procedure Laterality Date  . ABDOMINAL HYSTERECTOMY    . CAROTID STENT    . CHOLECYSTECTOMY    . DILATION AND CURETTAGE OF UTERUS    . NASAL SINUS SURGERY    . OVARIAN CYST REMOVAL    . REPLACEMENT TOTAL KNEE Right   . TONSILLECTOMY      Prior to Admission medications   Medication Sig Start Date End Date Taking? Authorizing Provider  acetaminophen (TYLENOL) 650 MG CR tablet Take 650 mg by mouth every 8 (eight) hours as needed for pain.    [provider]  albuterol (PROVENTIL HFA;VENTOLIN HFA) 108 (90  Base) MCG/ACT inhaler Inhale 2 puffs into the lungs every 6 (six) hours as needed for wheezing or shortness of breath. 04/28/17   Rudene Re, MD  amLODipine (NORVASC) 5 MG tablet Take 5 mg by mouth daily.    [provider]  amoxicillin-clavulanate (AUGMENTIN) 875-125 MG tablet Take 1 tablet by mouth 2 (two) times daily for 10 days. 04/28/17 05/08/17  Rudene Re, MD  aspirin 81 MG tablet Take 81 mg by mouth daily.    [provider]  Cholecalciferol 1000 UNIT/10ML LIQD Take 1,000 Units by mouth daily.    [provider]  cloNIDine (CATAPRES) 0.2 MG tablet Take 0.2 mg by mouth daily.    [provider]  cyclobenzaprine (FLEXERIL) 5 MG tablet Take 5 mg by mouth 2 (two) times daily as needed for muscle spasms.     [provider]  furosemide (LASIX) 20 MG tablet Take 60 mg by mouth daily.    [provider]  isosorbide dinitrate (ISORDIL) 20 MG tablet Take 20 mg by mouth 3 (three) times daily.    [provider]  levothyroxine (SYNTHROID, LEVOTHROID) 150 MCG tablet Take 150 mcg by mouth daily before breakfast. Take on an empty stomach 30 to 60 minutes before breakfast    [provider]  lisinopril (PRINIVIL,ZESTRIL) 20 MG tablet Take 20 mg by mouth daily.    [provider]  metoprolol (  LOPRESSOR) 100 MG tablet Take 100 mg by mouth 2 (two) times daily.     [provider]  ondansetron (ZOFRAN ODT) 4 MG disintegrating tablet Take 1 tablet (4 mg total) by mouth every 8 (eight) hours as needed for nausea or vomiting. 04/28/17   Rudene Re, MD  pravastatin (PRAVACHOL) 40 MG tablet Take 40 mg by mouth at bedtime.    [provider]  predniSONE (DELTASONE) 20 MG tablet Take 3 tablets (60 mg total) by mouth daily for 4 days. 04/28/17 05/02/17  Rudene Re, MD  psyllium (METAMUCIL) 58.6 % powder Take 1 packet by mouth daily as needed.    [provider]  traMADol-acetaminophen  (ULTRACET) 37.5-325 MG tablet Take 1 tablet by mouth every 8 (eight) hours as needed for severe pain.    [provider]  venlafaxine XR (EFFEXOR-XR) 75 MG 24 hr capsule Take 75 mg by mouth daily with breakfast.    [provider]    Allergies Doxycycline  History reviewed. No pertinent family history.  Social History Social History   Tobacco Use  . Smoking status: Never Smoker  . Smokeless tobacco: Never Used  Substance Use Topics  . Alcohol use: No  . Drug use: No    Review of Systems  Constitutional: Negative for fever. + generalized weakness Eyes: Negative for visual changes. ENT: Negative for sore throat. Neck: No neck pain  Cardiovascular: Negative for chest pain. Respiratory: + shortness of breath, cough, congestion Gastrointestinal: Negative for abdominal pain, vomiting or diarrhea. + nausea Genitourinary: Negative for dysuria. Musculoskeletal: Negative for back pain. Skin: Negative for rash. Neurological: Negative for headaches, weakness or numbness. Psych: No SI or HI  ____________________________________________   PHYSICAL EXAM:  VITAL SIGNS: ED Triage Vitals  Enc Vitals Group     BP 04/28/17 1435 (!) 122/53     Pulse Rate 04/28/17 1435 62     Resp 04/28/17 1435 16     Temp 04/28/17 1435 97.9 F (36.6 C)     Temp src --      SpO2 04/28/17 1435 97 %     Weight 04/28/17 1436 197 lb (89.4 kg)     Height 04/28/17 1436 5\' 1"  (1.549 m)     Head Circumference --      Peak Flow --      Pain Score --      Pain Loc --      Pain Edu? --      Excl. in [place]? --     Constitutional: Alert and oriented. Well appearing and in no apparent distress. HEENT:      Head: Normocephalic and atraumatic.         Eyes: Conjunctivae are normal. Sclera is non-icteric.       Mouth/Throat: Mucous membranes are moist.       Neck: Supple with no signs of meningismus. Cardiovascular: Regular rate and rhythm. No murmurs, gallops, or rubs. 2+ symmetrical distal  pulses are present in all extremities. No JVD. Respiratory: Normal respiratory effort. Lungs are clear to auscultation bilaterally with faint wheezes. No crackles, or rhonchi.  Gastrointestinal: Soft, non tender, and non distended with positive bowel sounds. No rebound or guarding. Musculoskeletal: Nontender with normal range of motion in all extremities. No edema, cyanosis, or erythema of extremities. Neurologic: Normal speech and language. Face is symmetric. Moving all extremities. No gross focal neurologic deficits are appreciated. Skin: Skin is warm, dry and intact. No rash noted. Psychiatric: Mood and affect are normal. Speech and  behavior are normal.  ____________________________________________   LABS (all labs ordered are listed, but only abnormal results are displayed)  Labs Reviewed  CBC WITH DIFFERENTIAL/PLATELET - Abnormal; Notable for the following components:      Result Value   RDW 15.0 (*)    Platelets 88 (*)    All other components within normal limits  BASIC METABOLIC PANEL - Abnormal; Notable for the following components:   Sodium 132 (*)    Chloride 95 (*)    BUN 27 (*)    Creatinine, Ser 1.13 (*)    Calcium 8.5 (*)    GFR calc non Af Amer 42 (*)    GFR calc Af Amer 49 (*)    All other components within normal limits  TROPONIN I   ____________________________________________  EKG  ED ECG REPORT I, Rudene Re, the attending physician, personally viewed and interpreted this ECG.  Normal sinus rhythm, rate of 61, normal intervals, normal axis, no ST elevations or depressions.  ____________________________________________  RADIOLOGY  CXR:  Compared to the prior plain film there is improved aeration of the right upper lobe, with persisting ill-defined opacities at the lung bases. The appearance may reflect atelectasis at the lung bases, however, persisting edema or infection not excluded.  Interval removal of the right upper extremity  PICC. ____________________________________________   PROCEDURES  Procedure(s) performed: None Procedures Critical Care performed:  None ____________________________________________   INITIAL IMPRESSION / ASSESSMENT AND PLAN / ED COURSE   83 y.o. female with h/o COPD on 2L [place], CAD, hypertension who presents for evaluation of cough, congestion, nausea, and generalized weakness x 2 days. Patient is well-appearing, in no distress, normal vital signs, normal work of breathing, satting 100% on 2 L which is her baseline, she does have faint wheezes but moving good air. At this time presentation most likely concerning for a viral URI causing a mild COPD exacerbation. We'll give steroids, DuoNeb, gentle IV hydration and Zofran for her nausea. We'll check basic lab work and a chest x-ray to rule out pneumonia. With no fever, no vomiting, no diarrhea, no body aches I feel that it is unlikely that she has flu this time.    _________________________ 5:23 PM on 04/28/2017 -----------------------------------------  Patient feels markedly improved after IV fluids, Zofran, steroids, and 1 DuoNeb. Her chest x-ray is concerning for atelectasis versus maybe early pneumonia. Patient was started on Augmentin. She has a normal white count and no fever. She is going to be discharged home on albuterol, Augmentin, Zofran, and prednisone. Her labs are within normal limits other than new mild thrombocytopenia of unknown etiology. Discussed this finding with patient and her son and recommended close follow-up with primary care doctor for further evaluation. Discussed return precautions for any signs of worsening infection such as fever, vomiting, shortness of breath, or chest pain.   As part of my medical decision making, I reviewed the following data within the [place] History obtained from family, Nursing notes reviewed and incorporated, Labs reviewed , EKG interpreted , Old EKG reviewed, Radiograph  reviewed , Notes from prior ED visits and [place] Controlled Substance Database    Pertinent labs & imaging results that were available during my care of the patient were reviewed by me and considered in my medical decision making (see chart for details).    ____________________________________________   FINAL CLINICAL IMPRESSION(S) / ED DIAGNOSES  Final diagnoses:  Acute bronchitis, unspecified organism  Dehydration  Thrombocytopenia (HCC)      NEW MEDICATIONS  STARTED DURING THIS VISIT:  ED Discharge Orders        Ordered    albuterol (PROVENTIL HFA;VENTOLIN HFA) 108 (90 Base) MCG/ACT inhaler  Every 6 hours PRN     04/28/17 1721    amoxicillin-clavulanate (AUGMENTIN) 875-125 MG tablet  2 times daily     04/28/17 1721    predniSONE (DELTASONE) 20 MG tablet  Daily     04/28/17 1721    ondansetron (ZOFRAN ODT) 4 MG disintegrating tablet  Every 8 hours PRN     04/28/17 1721       Note:  This document was prepared using Dragon voice recognition software and may include unintentional dictation errors.    Rudene Re, MD 04/28/17 [id number]

## 2017-05-04 ENCOUNTER — Emergency Department: Payer: Medicare Other

## 2017-05-04 ENCOUNTER — Inpatient Hospital Stay
Admission: EM | Admit: 2017-05-04 | Discharge: 2017-05-09 | DRG: 193 | Disposition: A | Discharge status: Skilled Nursing Facility | Payer: Medicare Other | Attending: Internal Medicine | Admitting: Internal Medicine

## 2017-05-04 ENCOUNTER — Other Ambulatory Visit: Payer: Self-pay

## 2017-05-04 ENCOUNTER — Inpatient Hospital Stay: Payer: Medicare Other

## 2017-05-04 DIAGNOSIS — B370 Candidal stomatitis: Secondary | ICD-10-CM | POA: Diagnosis present

## 2017-05-04 DIAGNOSIS — I2510 Atherosclerotic heart disease of native coronary artery without angina pectoris: Secondary | ICD-10-CM | POA: Diagnosis present

## 2017-05-04 DIAGNOSIS — Z7982 Long term (current) use of aspirin: Secondary | ICD-10-CM

## 2017-05-04 DIAGNOSIS — J181 Lobar pneumonia, unspecified organism: Secondary | ICD-10-CM | POA: Diagnosis present

## 2017-05-04 DIAGNOSIS — J440 Chronic obstructive pulmonary disease with acute lower respiratory infection: Secondary | ICD-10-CM | POA: Diagnosis present

## 2017-05-04 DIAGNOSIS — E039 Hypothyroidism, unspecified: Secondary | ICD-10-CM | POA: Diagnosis present

## 2017-05-04 DIAGNOSIS — J189 Pneumonia, unspecified organism: Secondary | ICD-10-CM | POA: Diagnosis present

## 2017-05-04 DIAGNOSIS — M1990 Unspecified osteoarthritis, unspecified site: Secondary | ICD-10-CM | POA: Diagnosis present

## 2017-05-04 DIAGNOSIS — J441 Chronic obstructive pulmonary disease with (acute) exacerbation: Secondary | ICD-10-CM | POA: Diagnosis present

## 2017-05-04 DIAGNOSIS — Z90710 Acquired absence of both cervix and uterus: Secondary | ICD-10-CM | POA: Diagnosis not present

## 2017-05-04 DIAGNOSIS — E860 Dehydration: Secondary | ICD-10-CM | POA: Diagnosis present

## 2017-05-04 DIAGNOSIS — Z96651 Presence of right artificial knee joint: Secondary | ICD-10-CM | POA: Diagnosis present

## 2017-05-04 DIAGNOSIS — Z881 Allergy status to other antibiotic agents status: Secondary | ICD-10-CM

## 2017-05-04 DIAGNOSIS — Z79899 Other long term (current) drug therapy: Secondary | ICD-10-CM | POA: Diagnosis not present

## 2017-05-04 DIAGNOSIS — K5900 Constipation, unspecified: Secondary | ICD-10-CM | POA: Diagnosis present

## 2017-05-04 DIAGNOSIS — J1008 Influenza due to other identified influenza virus with other specified pneumonia: Secondary | ICD-10-CM | POA: Diagnosis present

## 2017-05-04 DIAGNOSIS — R531 Weakness: Secondary | ICD-10-CM

## 2017-05-04 DIAGNOSIS — Z85828 Personal history of other malignant neoplasm of skin: Secondary | ICD-10-CM | POA: Diagnosis not present

## 2017-05-04 DIAGNOSIS — E876 Hypokalemia: Secondary | ICD-10-CM | POA: Diagnosis present

## 2017-05-04 DIAGNOSIS — Z79890 Hormone replacement therapy (postmenopausal): Secondary | ICD-10-CM

## 2017-05-04 DIAGNOSIS — Z8673 Personal history of transient ischemic attack (TIA), and cerebral infarction without residual deficits: Secondary | ICD-10-CM

## 2017-05-04 DIAGNOSIS — I10 Essential (primary) hypertension: Secondary | ICD-10-CM | POA: Diagnosis present

## 2017-05-04 DIAGNOSIS — E871 Hypo-osmolality and hyponatremia: Secondary | ICD-10-CM | POA: Diagnosis present

## 2017-05-04 DIAGNOSIS — J9621 Acute and chronic respiratory failure with hypoxia: Secondary | ICD-10-CM | POA: Diagnosis present

## 2017-05-04 LAB — TROPONIN I: Troponin I: <0.03 ng/mL (ref <0.03)

## 2017-05-04 LAB — CBC WITH DIFFERENTIAL/PLATELET
BASOS PCT: 0 %
Basophils Absolute: 0 10*3/uL (ref 0–0.1)
EOS ABS: 0.1 10*3/uL (ref 0–0.7)
Eosinophils Relative: 1 %
HCT: 44.4 % (ref 35.0–47.0)
HEMOGLOBIN: 14.7 g/dL (ref 12.0–16.0)
LYMPHS ABS: 1.2 10*3/uL (ref 1.0–3.6)
Lymphocytes Relative: 10 %
MCH: 29 pg (ref 26.0–34.0)
MCHC: 33.1 g/dL (ref 32.0–36.0)
MCV: 87.4 fL (ref 80.0–100.0)
MONO ABS: 0.9 10*3/uL (ref 0.2–0.9)
MONOS PCT: 7 %
Neutro Abs: 9.9 10*3/uL — ABNORMAL HIGH (ref 1.4–6.5)
Neutrophils Relative %: 82 %
Platelets: 144 10*3/uL — ABNORMAL LOW (ref 150–440)
RBC: 5.07 MIL/uL (ref 3.80–5.20)
RDW: 14.8 % — AB (ref 11.5–14.5)
WBC: 12.2 10*3/uL — ABNORMAL HIGH (ref 3.6–11.0)

## 2017-05-04 LAB — BASIC METABOLIC PANEL
ANION GAP: 8 (ref 5–15)
BUN: 24 mg/dL — AB (ref 6–20)
CALCIUM: 8.5 mg/dL — AB (ref 8.9–10.3)
CO2: 30 mmol/L (ref 22–32)
CREATININE: 0.94 mg/dL (ref 0.44–1.00)
Chloride: 95 mmol/L — ABNORMAL LOW (ref 101–111)
GFR calc Af Amer: >60 mL/min (ref >60)
GFR, EST NON AFRICAN AMERICAN: 53 mL/min — AB (ref >60)
GLUCOSE: 103 mg/dL — AB (ref 65–99)
Potassium: 3.4 mmol/L — ABNORMAL LOW (ref 3.5–5.1)
Sodium: 133 mmol/L — ABNORMAL LOW (ref 135–145)

## 2017-05-04 LAB — LACTIC ACID, PLASMA
LACTIC ACID, VENOUS: 0.9 mmol/L (ref 0.5–1.9)
Lactic Acid, Venous: 1.1 mmol/L (ref 0.5–1.9)

## 2017-05-04 MED ORDER — VENLAFAXINE HCL ER 75 MG PO CP24
75.0000 mg | ORAL_CAPSULE | Freq: Every day | ORAL | Status: DC
  Administered 2017-05-05 – 2017-05-09 (×5): 75 mg via ORAL
  Filled 2017-05-04 (×5): qty 1

## 2017-05-04 MED ORDER — LISINOPRIL 20 MG PO TABS
20.0000 mg | ORAL_TABLET | Freq: Every day | ORAL | Status: DC
  Administered 2017-05-05 – 2017-05-09 (×5): 20 mg via ORAL
  Filled 2017-05-04 (×5): qty 1

## 2017-05-04 MED ORDER — SODIUM CHLORIDE 0.9 % IV SOLN
Freq: Once | INTRAVENOUS | Status: AC
  Administered 2017-05-04: 19:00:00 via INTRAVENOUS

## 2017-05-04 MED ORDER — METOPROLOL TARTRATE 50 MG PO TABS
100.0000 mg | ORAL_TABLET | Freq: Two times a day (BID) | ORAL | Status: DC
  Administered 2017-05-04 – 2017-05-09 (×10): 100 mg via ORAL
  Filled 2017-05-04 (×10): qty 2

## 2017-05-04 MED ORDER — ENOXAPARIN SODIUM 40 MG/0.4ML ~~LOC~~ SOLN
40.0000 mg | SUBCUTANEOUS | Status: DC
  Administered 2017-05-04 – 2017-05-08 (×5): 40 mg via SUBCUTANEOUS
  Filled 2017-05-04 (×5): qty 0.4

## 2017-05-04 MED ORDER — TRAMADOL HCL 50 MG PO TABS
50.0000 mg | ORAL_TABLET | Freq: Four times a day (QID) | ORAL | Status: DC | PRN
  Administered 2017-05-05 – 2017-05-08 (×2): 50 mg via ORAL
  Filled 2017-05-04 (×3): qty 1

## 2017-05-04 MED ORDER — POLYETHYLENE GLYCOL 3350 17 G PO PACK: 17.0000 g | PACK | Freq: Every day | ORAL | Status: DC | PRN

## 2017-05-04 MED ORDER — ONDANSETRON HCL 4 MG/2ML IJ SOLN: 4.0000 mg | Freq: Four times a day (QID) | INTRAMUSCULAR | Status: DC | PRN

## 2017-05-04 MED ORDER — DEXTROSE 5 % IV SOLN
1.0000 g | INTRAVENOUS | Status: DC
  Administered 2017-05-05 – 2017-05-06 (×2): 1 g via INTRAVENOUS
  Filled 2017-05-04 (×3): qty 10

## 2017-05-04 MED ORDER — LEVOTHYROXINE SODIUM 50 MCG PO TABS
150.0000 ug | ORAL_TABLET | Freq: Every day | ORAL | Status: DC
  Administered 2017-05-06 – 2017-05-09 (×4): 150 ug via ORAL
  Filled 2017-05-04 (×4): qty 1

## 2017-05-04 MED ORDER — ASPIRIN EC 81 MG PO TBEC
81.0000 mg | DELAYED_RELEASE_TABLET | Freq: Every day | ORAL | Status: DC
  Administered 2017-05-05 – 2017-05-09 (×5): 81 mg via ORAL
  Filled 2017-05-04 (×7): qty 1

## 2017-05-04 MED ORDER — ISOSORBIDE DINITRATE 20 MG PO TABS
20.0000 mg | ORAL_TABLET | Freq: Three times a day (TID) | ORAL | Status: DC
  Administered 2017-05-04 – 2017-05-09 (×14): 20 mg via ORAL
  Filled 2017-05-04 (×15): qty 1

## 2017-05-04 MED ORDER — FUROSEMIDE 40 MG PO TABS
60.0000 mg | ORAL_TABLET | Freq: Every day | ORAL | Status: DC
  Administered 2017-05-05: 11:00:00 60 mg via ORAL
  Filled 2017-05-04: qty 1

## 2017-05-04 MED ORDER — AZITHROMYCIN 500 MG IV SOLR
500.0000 mg | Freq: Once | INTRAVENOUS | Status: AC
  Administered 2017-05-04: 500 mg via INTRAVENOUS
  Filled 2017-05-04: qty 500

## 2017-05-04 MED ORDER — ACETAMINOPHEN 650 MG RE SUPP: 650.0000 mg | Freq: Four times a day (QID) | RECTAL | Status: DC | PRN

## 2017-05-04 MED ORDER — ACETAMINOPHEN 325 MG PO TABS
650.0000 mg | ORAL_TABLET | Freq: Four times a day (QID) | ORAL | Status: DC | PRN
  Administered 2017-05-07 (×2): 650 mg via ORAL
  Filled 2017-05-04 (×2): qty 2

## 2017-05-04 MED ORDER — ONDANSETRON HCL 4 MG PO TABS: 4.0000 mg | ORAL_TABLET | Freq: Four times a day (QID) | ORAL | Status: DC | PRN

## 2017-05-04 MED ORDER — CEFTRIAXONE SODIUM IN DEXTROSE 20 MG/ML IV SOLN
1.0000 g | Freq: Once | INTRAVENOUS | Status: AC
  Administered 2017-05-04: 1 g via INTRAVENOUS
  Filled 2017-05-04: qty 50

## 2017-05-04 MED ORDER — VITAMIN D 1000 UNITS PO TABS
1000.0000 [IU] | ORAL_TABLET | Freq: Every day | ORAL | Status: DC
  Administered 2017-05-05 – 2017-05-09 (×5): 1000 [IU] via ORAL
  Filled 2017-05-04 (×5): qty 1

## 2017-05-04 MED ORDER — AZITHROMYCIN 250 MG PO TABS
250.0000 mg | ORAL_TABLET | Freq: Every day | ORAL | Status: DC
  Administered 2017-05-05 – 2017-05-06 (×2): 250 mg via ORAL
  Filled 2017-05-04 (×2): qty 1

## 2017-05-04 MED ORDER — AMLODIPINE BESYLATE 5 MG PO TABS
5.0000 mg | ORAL_TABLET | Freq: Every day | ORAL | Status: DC
  Administered 2017-05-05 – 2017-05-09 (×4): 5 mg via ORAL
  Filled 2017-05-04 (×5): qty 1

## 2017-05-04 MED ORDER — CLONIDINE HCL 0.1 MG PO TABS
0.2000 mg | ORAL_TABLET | Freq: Every day | ORAL | Status: DC
  Administered 2017-05-06 – 2017-05-09 (×3): 0.2 mg via ORAL
  Filled 2017-05-04 (×5): qty 2

## 2017-05-04 MED ORDER — PRAVASTATIN SODIUM 20 MG PO TABS
40.0000 mg | ORAL_TABLET | Freq: Every day | ORAL | Status: DC
  Administered 2017-05-04: 40 mg via ORAL
  Filled 2017-05-04: qty 2

## 2017-05-04 MED ORDER — ALBUTEROL SULFATE (2.5 MG/3ML) 0.083% IN NEBU: 3.0000 mL | INHALATION_SOLUTION | Freq: Four times a day (QID) | RESPIRATORY_TRACT | Status: DC | PRN

## 2017-05-04 MED ORDER — CYCLOBENZAPRINE HCL 10 MG PO TABS
5.0000 mg | ORAL_TABLET | Freq: Two times a day (BID) | ORAL | Status: DC | PRN
  Administered 2017-05-08: 5 mg via ORAL
  Filled 2017-05-04: qty 1

## 2017-05-04 MED ORDER — SENNA 8.6 MG PO TABS
1.0000 | ORAL_TABLET | Freq: Two times a day (BID) | ORAL | Status: DC
  Administered 2017-05-04 – 2017-05-09 (×10): 8.6 mg via ORAL
  Filled 2017-05-04 (×10): qty 1

## 2017-05-04 MED ORDER — ONDANSETRON 4 MG PO TBDP
4.0000 mg | ORAL_TABLET | Freq: Three times a day (TID) | ORAL | Status: DC | PRN
  Filled 2017-05-04: qty 1

## 2017-05-04 NOTE — ED Notes (Signed)
MD aware of difficult IV placement

## 2017-05-04 NOTE — ED Provider Notes (Signed)
[facility] Emergency Department Provider Note ____________________________________________   I have reviewed the triage vital signs and the triage nursing note.  HISTORY  Chief Complaint Shortness of Breath   Historian Level 5 Caveat History Limited by patient is a poor historian, limited historian probably due to illness right now Son who she lives with gives most of the history  HPI Lisa Haney is a 84 y.o. female with a history of COPD that occasionally wears home O2, often just at night, however she has increased it to daily 24-hour use especially over the last several days because she is too short of breath to get around.  She was diagnosed with upper respiratory infection and started on Augmentin on December 30 which is approximately 7 days ago now.  She lives with her son and they state that she has been functionally declining since then.  She continues to be short of breath with short walking and is requiring oxygen all the time now.  She is so weak she can barely walk over the last 24 hours.   Past Medical History:  Diagnosis Date  . Arthritis   . Cancer ([place])    skin cancer on hand  . COPD (chronic obstructive pulmonary disease) ([place])   . Coronary artery disease    stent  . Diverticulitis   . Hypertension   . Stroke [facility])    TIA  . TIA (transient ischemic attack)     Patient Active Problem List   Diagnosis Date Noted  . COPD exacerbation ([place]) 02/13/2016    Past Surgical History:  Procedure Laterality Date  . ABDOMINAL HYSTERECTOMY    . CAROTID STENT    . CHOLECYSTECTOMY    . DILATION AND CURETTAGE OF UTERUS    . NASAL SINUS SURGERY    . OVARIAN CYST REMOVAL    . REPLACEMENT TOTAL KNEE Right   . TONSILLECTOMY      Prior to Admission medications   Medication Sig Start Date End Date Taking? Authorizing Provider  acetaminophen (TYLENOL) 650 MG CR tablet Take 650 mg by mouth every 8 (eight) hours as needed for pain.     [provider]  albuterol (PROVENTIL HFA;VENTOLIN HFA) 108 (90 Base) MCG/ACT inhaler Inhale 2 puffs into the lungs every 6 (six) hours as needed for wheezing or shortness of breath. 04/28/17   Rudene Re, MD  amLODipine (NORVASC) 5 MG tablet Take 5 mg by mouth daily.    [provider]  amoxicillin-clavulanate (AUGMENTIN) 875-125 MG tablet Take 1 tablet by mouth 2 (two) times daily for 10 days. 04/28/17 05/08/17  Rudene Re, MD  aspirin 81 MG tablet Take 81 mg by mouth daily.    [provider]  Cholecalciferol 1000 UNIT/10ML LIQD Take 1,000 Units by mouth daily.    [provider]  cloNIDine (CATAPRES) 0.2 MG tablet Take 0.2 mg by mouth daily.    [provider]  cyclobenzaprine (FLEXERIL) 5 MG tablet Take 5 mg by mouth 2 (two) times daily as needed for muscle spasms.     [provider]  furosemide (LASIX) 20 MG tablet Take 60 mg by mouth daily.    [provider]  isosorbide dinitrate (ISORDIL) 20 MG tablet Take 20 mg by mouth 3 (three) times daily.    [provider]  levothyroxine (SYNTHROID, LEVOTHROID) 150 MCG tablet Take 150 mcg by mouth daily before breakfast. Take on an empty stomach 30 to 60 minutes before breakfast    [provider]  lisinopril (PRINIVIL,ZESTRIL) 20 MG tablet Take 20 mg by mouth daily.    [provider]  metoprolol (LOPRESSOR) 100 MG tablet Take 100 mg by mouth 2 (two) times daily.     [provider]  ondansetron (ZOFRAN ODT) 4 MG disintegrating tablet Take 1 tablet (4 mg total) by mouth every 8 (eight) hours as needed for nausea or vomiting. 04/28/17   Rudene Re, MD  pravastatin (PRAVACHOL) 40 MG tablet Take 40 mg by mouth at bedtime.    [provider]  psyllium (METAMUCIL) 58.6 % powder Take 1 packet by mouth daily as needed.    [provider]  traMADol-acetaminophen (ULTRACET) 37.5-325 MG tablet Take 1 tablet by mouth every 8  (eight) hours as needed for severe pain.    [provider]  venlafaxine XR (EFFEXOR-XR) 75 MG 24 hr capsule Take 75 mg by mouth daily with breakfast.    [provider]    Allergies  Allergen Reactions  . Doxycycline Other (See Comments)    Severe upset stomach and pain     History reviewed. No pertinent family history.  Social History Social History   Tobacco Use  . Smoking status: Never Smoker  . Smokeless tobacco: Never Used  Substance Use Topics  . Alcohol use: No  . Drug use: No    Review of Systems  Constitutional: Negative for fever. Eyes: Negative for visual changes. ENT: Negative for sore throat. Cardiovascular: Negative for chest pain. Respiratory: Positive for shortness of breath. Gastrointestinal: Negative for abdominal pain, vomiting and diarrhea. Genitourinary: Negative for dysuria. Musculoskeletal: Negative for back pain. Skin: Negative for rash. Neurological: Negative for headache.  ____________________________________________   PHYSICAL EXAM:  VITAL SIGNS: ED Triage Vitals  Enc Vitals Group     BP 05/04/17 1245 (!) 150/58     Pulse Rate 05/04/17 1245 66     Resp 05/04/17 1245 18     Temp 05/04/17 1245 98.5 F (36.9 C)     Temp Source 05/04/17 1245 Oral     SpO2 05/04/17 1245 96 %     Weight 05/04/17 1247 197 lb (89.4 kg)     Height 05/04/17 1247 5\' 1"  (1.549 m)     Head Circumference --      Peak Flow --      Pain Score 05/04/17 1245 5     Pain Loc --      Pain Edu? --      Excl. in [place]? --      Constitutional: Alert but looks extremely fatigued.  No acute respiratory distress.   HEENT   Head: Normocephalic and atraumatic.      Eyes: Conjunctivae are normal. Pupils equal and round.       Ears:         Nose: No congestion/rhinnorhea.   Mouth/Throat: Mucous membranes are moist.   Neck: No stridor. Cardiovascular/Chest: Normal rate, regular rhythm.  No murmurs, rubs, or gallops. Respiratory: Normal  respiratory effort without tachypnea nor retractions.  Decreased air movement throughout.  Mild rhonchi both bases. Gastrointestinal: Soft. No distention, no guarding, no rebound. Nontender.    Genitourinary/rectal:Deferred Musculoskeletal: Nontender with normal range of motion in all extremities. No joint effusions.  No lower extremity tenderness.  Moderate, 2+ lower extremity edema. Neurologic: No facial droop.  She is a little slow to answer questions, but she is following commands.  No gross or focal deficits on exam. Skin:  Skin is warm, dry and intact. No rash noted. Psychiatric: Mood and  affect are normal. Speech and behavior are normal. Patient exhibits appropriate insight and judgment.   ____________________________________________  LABS (pertinent positives/negatives) I, Lisa Roca, MD the attending physician have reviewed the labs noted below.  Labs Reviewed  BASIC METABOLIC PANEL - Abnormal; Notable for the following components:      Result Value   Sodium 133 (*)    Potassium 3.4 (*)    Chloride 95 (*)    Glucose, Bld 103 (*)    BUN 24 (*)    Calcium 8.5 (*)    GFR calc non Af Amer 53 (*)    All other components within normal limits  CBC WITH DIFFERENTIAL/PLATELET - Abnormal; Notable for the following components:   WBC 12.2 (*)    RDW 14.8 (*)    Platelets 144 (*)    Neutro Abs 9.9 (*)    All other components within normal limits  CULTURE, BLOOD (ROUTINE X 2)  CULTURE, BLOOD (ROUTINE X 2)  TROPONIN I  BLOOD GAS, VENOUS  LACTIC ACID, PLASMA  LACTIC ACID, PLASMA    ____________________________________________    EKG I, Lisa Roca, MD, the attending physician have personally viewed and interpreted all ECGs.  67 bpm.  Appears to be sinus rhythm.  Narrow QRS.  Normal axis.  Nonspecific ST and T wave ____________________________________________  RADIOLOGY All Xrays were viewed by me.  Imaging interpreted by Radiologist, and I, Lisa Roca, MD the  attending physician have reviewed the radiologist interpretation noted below.  Chest x-ray two-view:   IMPRESSION: Suspected bilateral lower lobe pneumonia, grossly unchanged.  CT head without contrast: Pending __________________________________________  PROCEDURES  Procedure(s) performed: Peripheral IV performed by myself, Dr. Reita Cliche MD.  Location left neck, external jugular IV accessed with a 20-gauge IV.  Critical Care performed: None   ____________________________________________  ED COURSE / ASSESSMENT AND PLAN  Pertinent labs & imaging results that were available during my care of the patient were reviewed by me and considered in my medical decision making (see chart for details).    Patient brought in by son with whom she lives for worsening condition despite being on antibiotics for almost a week for an upper respiratory infection.  She has increased her oxygen use, from as needed to 24 hours around-the-clock.  She is also had increased fatigue and generalized weakness to the point where she can hardly stand up on her own over the last 24 hours.  No hypotension here.  She is on 2 L satting around 95%.  Chest x-ray shows bilateral pneumonia, at this point I am concerned about failed outpatient therapy given she has been on Augmentin.  Patient will be started on Rocephin and azithromycin IV.  Lactate is normal.  She is clinically dehydrated with poor vascular access.  I was able to place a left sided external jugular IV.  However limited drop back and blood cultures were unable to be obtained after multiple sticks as well as the external jugular IV.  I did not want to continue to delay, so IV antibiotics were started.  I had discussed with family also obtaining head CT given the altered mental status.  This is pending at time of hospitalist consultation.  Urinalysis is also pending at time of hospitalist consultation.    DIFFERENTIAL DIAGNOSIS: Including but not  limited to sepsis, pneumonia, dehydration, acute renal failure, electrolyte disturbance, urinary tract infection, stroke, etc.  CONSULTATIONS:   Hospitalist for admission, Dr. Posey Pronto.  Patient / Family / Caregiver informed of clinical course, medical decision-making  process, and agree with plan.    ___________________________________________   FINAL CLINICAL IMPRESSION(S) / ED DIAGNOSES   Final diagnoses:  Pneumonia of both lungs due to infectious organism, unspecified part of lung  Generalized weakness      ___________________________________________        Note: This dictation was prepared with Dragon dictation. Any transcriptional errors that result from this process are unintentional    Lisa Roca, MD 05/04/17 [phone]

## 2017-05-04 NOTE — H&P (Addendum)
[facility] NAME: Lisa Haney    MR#:  [id number]  DATE OF BIRTH:  06/27/1926  DATE OF ADMISSION:  05/04/2017  PRIMARY CARE PHYSICIAN: Glendon Axe, MD   REQUESTING/REFERRING PHYSICIAN: Dr Reita Cliche  Worsening weakness HISTORY OF PRESENT ILLNESS:  Lisa Haney  is a 84 y.o. female with a known history of COPD, coronary artery disease, hypertension comes to the emergency room accompanied by family members after she got very weak and unable to get around at home remains sleepy less active and recently started on Augmentin for her cough and nasal congestion. She was seen in the emergency room on 04/28/2017 when she was placed on antibiotic.   x-ray shows bilateral pneumonia. Patient failed outpatient treatment. Her white count is elevated at 12,000. She is being admitted with community-acquired pneumonia.  PAST MEDICAL HISTORY:   Past Medical History:  Diagnosis Date  . Arthritis   . Cancer ([place])    skin cancer on hand  . COPD (chronic obstructive pulmonary disease) ([place])   . Coronary artery disease    stent  . Diverticulitis   . Hypertension   . Stroke [facility])    TIA  . TIA (transient ischemic attack)     PAST SURGICAL HISTOIRY:   Past Surgical History:  Procedure Laterality Date  . ABDOMINAL HYSTERECTOMY    . CAROTID STENT    . CHOLECYSTECTOMY    . DILATION AND CURETTAGE OF UTERUS    . NASAL SINUS SURGERY    . OVARIAN CYST REMOVAL    . REPLACEMENT TOTAL KNEE Right   . TONSILLECTOMY      SOCIAL HISTORY:   Social History   Tobacco Use  . Smoking status: Never Smoker  . Smokeless tobacco: Never Used  Substance Use Topics  . Alcohol use: No    FAMILY HISTORY:  History reviewed. No pertinent family history.  DRUG ALLERGIES:   Allergies  Allergen Reactions  . Doxycycline Other (See Comments)    Severe upset stomach and pain     REVIEW OF SYSTEMS:  Review of Systems  Constitutional: Negative for  chills, fever and weight loss.  HENT: Negative for ear discharge, ear pain and nosebleeds.   Eyes: Negative for blurred vision, pain and discharge.  Respiratory: Positive for cough and shortness of breath. Negative for sputum production, wheezing and stridor.   Cardiovascular: Negative for chest pain, palpitations, orthopnea and PND.  Gastrointestinal: Negative for abdominal pain, diarrhea, nausea and vomiting.  Genitourinary: Negative for frequency and urgency.  Musculoskeletal: Positive for back pain. Negative for joint pain.  Neurological: Positive for weakness. Negative for sensory change, speech change and focal weakness.  Psychiatric/Behavioral: Negative for depression and hallucinations. The patient is not nervous/anxious.      MEDICATIONS AT HOME:   Prior to Admission medications   Medication Sig Start Date End Date Taking? Authorizing Provider  acetaminophen (TYLENOL) 650 MG CR tablet Take 650 mg by mouth every 8 (eight) hours as needed for pain.   Yes [provider]  albuterol (PROVENTIL HFA;VENTOLIN HFA) 108 (90 Base) MCG/ACT inhaler Inhale 2 puffs into the lungs every 6 (six) hours as needed for wheezing or shortness of breath. 04/28/17  Yes Alfred Levins, [place], MD  amLODipine (NORVASC) 5 MG tablet Take 5 mg by mouth daily.   Yes [provider]  aspirin 81 MG tablet Take 81 mg by mouth daily.   Yes [provider]  Cholecalciferol 1000 UNIT/10ML LIQD Take 1,000 Units  by mouth daily.   Yes [provider]  cloNIDine (CATAPRES) 0.2 MG tablet Take 0.2 mg by mouth daily.   Yes [provider]  cyclobenzaprine (FLEXERIL) 5 MG tablet Take 5 mg by mouth 2 (two) times daily as needed for muscle spasms.    Yes [provider]  furosemide (LASIX) 20 MG tablet Take 60 mg by mouth daily.   Yes [provider]  isosorbide dinitrate (ISORDIL) 20 MG tablet Take 20 mg by mouth 3 (three) times daily.   Yes [provider]   levothyroxine (SYNTHROID, LEVOTHROID) 150 MCG tablet Take 150 mcg by mouth daily before breakfast. Take on an empty stomach 30 to 60 minutes before breakfast   Yes [provider]  lisinopril (PRINIVIL,ZESTRIL) 20 MG tablet Take 20 mg by mouth daily.   Yes [provider]  metoprolol (LOPRESSOR) 100 MG tablet Take 100 mg by mouth 2 (two) times daily.    Yes [provider]  ondansetron (ZOFRAN ODT) 4 MG disintegrating tablet Take 1 tablet (4 mg total) by mouth every 8 (eight) hours as needed for nausea or vomiting. 04/28/17  Yes Alfred Levins, [place], MD  pravastatin (PRAVACHOL) 40 MG tablet Take 40 mg by mouth at bedtime.   Yes [provider]  psyllium (METAMUCIL) 58.6 % powder Take 1 packet by mouth daily as needed.   Yes [provider]  traMADol-acetaminophen (ULTRACET) 37.5-325 MG tablet Take 1 tablet by mouth every 8 (eight) hours as needed for severe pain.   Yes [provider]  venlafaxine XR (EFFEXOR-XR) 75 MG 24 hr capsule Take 75 mg by mouth daily with breakfast.   Yes [provider]      VITAL SIGNS:  Blood pressure (!) 150/58, pulse 66, temperature 98.5 F (36.9 C), temperature source Oral, resp. rate 18, height 5\' 1"  (1.549 m), weight 89.4 kg (197 lb), SpO2 96 %.  PHYSICAL EXAMINATION:  GENERAL:  84 y.o.-year-old patient lying in the bed with no acute distress.  EYES: Pupils equal, round, reactive to light and accommodation. No scleral icterus. Extraocular muscles intact.  HEENT: Head atraumatic, normocephalic. Oropharynx and nasopharynx clear.  NECK:  Supple, no jugular venous distention. No thyroid enlargement, no tenderness.  LUNGS: coarse breath sounds bilaterally, no wheezing, rales,rhonchi or crepitation. No use of accessory muscles of respiration.  CARDIOVASCULAR: S1, S2 normal. No murmurs, rubs, or gallops.  ABDOMEN: Soft, nontender, nondistended. Bowel sounds present. No organomegaly or mass.  EXTREMITIES:  No pedal edema, cyanosis, or clubbing.  NEUROLOGIC: Cranial nerves II through XII are intact. Muscle strength 4/5 in all extremities. overall very weak Sensation intact. Gait not checked.  PSYCHIATRIC: The patient is alert  but weak and lethargic  SKIN: No obvious rash, lesion, or ulcer.   LABORATORY PANEL:   CBC Recent Labs  Lab 05/04/17 1404  WBC 12.2*  HGB 14.7  HCT 44.4  PLT 144*   ------------------------------------------------------------------------------------------------------------------  Chemistries  Recent Labs  Lab 05/04/17 1404  NA 133*  K 3.4*  CL 95*  CO2 30  GLUCOSE 103*  BUN 24*  CREATININE 0.94  CALCIUM 8.5*   ------------------------------------------------------------------------------------------------------------------  Cardiac Enzymes Recent Labs  Lab 05/04/17 1404  TROPONINI <0.03   ------------------------------------------------------------------------------------------------------------------  RADIOLOGY:  Dg Chest 2 View  Result Date: 05/04/2017 CLINICAL DATA:  Shortness of breath, recent pneumonia EXAM: CHEST  2 VIEW COMPARISON:  04/28/2017 FINDINGS: Patchy bilateral lower lobe opacities, suspicious for pneumonia. No pleural effusion or pneumothorax. Mild cardiomegaly. Mild degenerative changes of the visualized thoracolumbar  spine. IMPRESSION: Suspected bilateral lower lobe pneumonia, grossly unchanged. Electronically Signed   By: Julian Hy M.D.   On: 05/04/2017 13:43   Ct Head Wo Contrast  Result Date: 05/04/2017 CLINICAL DATA:  Increased weakness, unable to walk, history COPD, coronary artery disease post stenting, hypertension, stroke EXAM: CT HEAD WITHOUT CONTRAST TECHNIQUE: Contiguous axial images were obtained from the base of the skull through the vertex without intravenous contrast. Sagittal and coronal MPR images reconstructed from axial data set. COMPARISON:  09/18/2010 FINDINGS: Brain: Generalized atrophy. Normal  ventricular morphology. No midline shift or mass effect. Old RIGHT occipital infarct. Small vessel chronic ischemic changes of deep cerebral white matter. Old lacunar infarcts at RIGHT basal ganglia. No intracranial hemorrhage, mass lesion, or evidence acute infarction. No extra-axial fluid collections. Vascular: Atherosclerotic calcification of internal carotid arteries bilaterally at skullbase Skull: Intact Sinuses/Orbits: Tiny fluid levels dependently in the maxillary sinuses. Prior maxillary sinus decompressions bilaterally with resection of turbinates. Other: N/A IMPRESSION: Atrophy with small vessel chronic ischemic changes of deep cerebral white matter. Old lacunar infarcts RIGHT basal ganglia and old RIGHT occipital infarct. No acute intracranial abnormalities. Electronically Signed   By: Lavonia Dana M.D.   On: 05/04/2017 16:12    EKG:    IMPRESSION AND PLAN:   Weltman  is a 83 y.o. female with a known history of COPD, coronary artery disease, hypertension comes to the emergency room accompanied by family members after she got very weak and unable to get around at home remains sleepy less active and recently started on Augmentin for her cough and nasal congestion.  1. Community or pneumonia  - patient failed outpatient treatment. She got worse with increasing cough and a lot of white count -CXR shows bilateral bibasilar pneumonia  -IV Rocephin and Zithromax  -Follow blood culture  -IV fluids   2. Generalized weakness/deconditioning from #1 -Physical therapy to see patient  3. Leukocytosis due to pneumonia  4. DVT prophylaxis--subcutaneous Lovenox  5. History of COPD on chronic 2 L nasal Oxygen -Stable. Continue nebulizer as needed and inhalers    All the records are reviewed and case discussed with ED provider. Management plans discussed with the patient, family and they are in agreement.  CODE STATUS: full  TOTAL TIME TAKING CARE OF THIS PATIENT: *50* minutes.    Fritzi Mandes M.D on 05/04/2017 at 4:23 PM  Between 7am to 6pm - Pager - [phone]  After 6pm go to [email or web address] - password [email or web address] [facility]  [facility]  Office  [phone]  CC: Primary care physician; Glendon Axe, MD

## 2017-05-04 NOTE — ED Notes (Signed)
This tech and Ulice Dash, rn changed pt in dry brief and new gown and dry sheets; this tech also place barrier cream and pink sacral pad on pt buttocks

## 2017-05-04 NOTE — Progress Notes (Signed)
Pt has home CPAP at bedside, [facility] doc paged, Verbal order received by MD Sparks ok to use. Order placed.

## 2017-05-04 NOTE — ED Triage Notes (Signed)
Patient in ER couple days ago, has not gotten better

## 2017-05-04 NOTE — Progress Notes (Signed)
Family Meeting Note  Advance Directive:yes  Today a meeting took place with the ER patient is unable to participate due to being lethargic and very weak from pneumonia   The following clinical team members were present during this meeting: Patient's sons The following were discussed:Patient's diagnosis was discussed with patient's son and extended family in the ER. She's been admitted with pneumonia and generalized weakness with deconditioning. Prognosis fair. Status discussed with some and they request full code  Time spent during discussion:16 mins  Fritzi Mandes, MD

## 2017-05-04 NOTE — ED Notes (Addendum)
Pt has an EJ for access. Pt is Alert and oriented with family at bedside. This RN gave pt water and ice, pt shows no signs of difficulty with drinking or swallowing at this time. Pt sketcher in lowest position call light with in reach.

## 2017-05-04 NOTE — ED Notes (Signed)
Called report to Daleen Snook RN

## 2017-05-04 NOTE — ED Notes (Signed)
Changed brief, placed barrier cream and Alevin foam on buttocks.

## 2017-05-05 LAB — EXPECTORATED SPUTUM ASSESSMENT W GRAM STAIN, RFLX TO RESP C: Special Requests: NORMAL

## 2017-05-05 LAB — MRSA PCR SCREENING: MRSA by PCR: NEGATIVE

## 2017-05-05 LAB — PROCALCITONIN

## 2017-05-05 LAB — INFLUENZA PANEL BY PCR (TYPE A & B)
INFLAPCR: POSITIVE — AB
INFLBPCR: NEGATIVE

## 2017-05-05 MED ORDER — BUDESONIDE 0.5 MG/2ML IN SUSP
0.5000 mg | Freq: Two times a day (BID) | RESPIRATORY_TRACT | Status: DC
  Administered 2017-05-05 – 2017-05-09 (×8): 0.5 mg via RESPIRATORY_TRACT
  Filled 2017-05-05 (×8): qty 2

## 2017-05-05 MED ORDER — VANCOMYCIN HCL IN DEXTROSE 1-5 GM/200ML-% IV SOLN
1000.0000 mg | Freq: Two times a day (BID) | INTRAVENOUS | Status: DC
  Filled 2017-05-05 (×2): qty 200

## 2017-05-05 MED ORDER — VANCOMYCIN HCL IN DEXTROSE 1-5 GM/200ML-% IV SOLN
1000.0000 mg | Freq: Once | INTRAVENOUS | Status: AC
  Administered 2017-05-05: 1000 mg via INTRAVENOUS
  Filled 2017-05-05: qty 200

## 2017-05-05 MED ORDER — OSELTAMIVIR PHOSPHATE 30 MG PO CAPS
30.0000 mg | ORAL_CAPSULE | Freq: Two times a day (BID) | ORAL | Status: DC
  Administered 2017-05-05 – 2017-05-09 (×8): 30 mg via ORAL
  Filled 2017-05-05 (×8): qty 1

## 2017-05-05 MED ORDER — ALBUTEROL SULFATE (2.5 MG/3ML) 0.083% IN NEBU
3.0000 mL | INHALATION_SOLUTION | Freq: Four times a day (QID) | RESPIRATORY_TRACT | Status: DC
  Administered 2017-05-05 – 2017-05-09 (×14): 3 mL via RESPIRATORY_TRACT
  Filled 2017-05-05 (×14): qty 3

## 2017-05-05 MED ORDER — METHYLPREDNISOLONE SODIUM SUCC 40 MG IJ SOLR
40.0000 mg | Freq: Every day | INTRAMUSCULAR | Status: DC
  Administered 2017-05-05 – 2017-05-07 (×3): 40 mg via INTRAVENOUS
  Filled 2017-05-05 (×3): qty 1

## 2017-05-05 MED ORDER — LACTULOSE 10 GM/15ML PO SOLN
20.0000 g | Freq: Once | ORAL | Status: AC
  Administered 2017-05-05: 20 g via ORAL
  Filled 2017-05-05: qty 30

## 2017-05-05 NOTE — Evaluation (Signed)
Physical Therapy Evaluation Patient Details Name: Lisa Haney MRN: [id number] DOB: Jul 24, 1926 Today's Date: 05/05/2017   History of Present Illness  84 yo Female came to ED with increased shortness of breath; She was diagnosed with pneumonia. Patient has a PMH significant for COPD, CAD, HTN; she is on 2L O2 at home at baseline. she lives at home with her son who does work during the day; patient reports having a decline in mobility in last week;   Clinical Impression  84 yo Female came to ED with pneumonia. Patient was living at home with her son prior to admittance. She reports a decline in mobility over last week. Patient was walking short distances with walker and was able to do some household chores including some cooking. However currently she is very fatigued and short of breath; She is supervision for bed mobility; Patient transfers sit<>Stand from bed with RW min A requiring cues for forward weight shift and several attempts due to fatigue; Ambulated 10 feet in room with RW, min A with very slow gait speed, step to pattern with forward flexed posture. Patient reports increased fear of falling despite no recent falls. She does exhibit increased weakness in BLE grossly 3/5; In addition she exhibits poor standing balance. She would benefit from additional skilled PT intervention to improve strength, balance and gait safety; Recommend SNF rehab to improve safety and mobility prior to going home. Patient not safe to go home at this time as she would be home alone during the day and she requires too much assistance to be alone;     Follow Up Recommendations SNF;Supervision/Assistance - 24 hour    Equipment Recommendations  None recommended by PT;Other (comment)(to be deferred post acute; )    Recommendations for Other Services       Precautions / Restrictions Precautions Precautions: Fall Restrictions Weight Bearing Restrictions: No      Mobility  Bed Mobility Overal bed mobility:  Needs Assistance Bed Mobility: Supine to Sit     Supine to sit: Supervision     General bed mobility comments: with cues for hand placement on bed rails; requires increased time;   Transfers Overall transfer level: Needs assistance Equipment used: Rolling walker (2 wheeled) Transfers: Sit to/from Stand Sit to Stand: Min assist         General transfer comment: with cues for hand placement and to increase forward lean; patient requires several attempts due to fear of falling and weakness;   Ambulation/Gait Ambulation/Gait assistance: Min assist Ambulation Distance (Feet): 10 Feet Assistive device: Rolling walker (2 wheeled) Gait Pattern/deviations: Step-to pattern;Decreased step length - right;Decreased step length - left;Decreased dorsiflexion - right;Decreased dorsiflexion - left;Narrow base of support;Trunk flexed;Shuffle Gait velocity: decreased   General Gait Details: very slow gait speed; exhibits heavy forward trunk lean; Patient reports increased fear of falling requiring mod VCs for walker placement and to improve foot placement/trunk control;   Stairs            Wheelchair Mobility    Modified Rankin (Stroke Patients Only)       Balance Overall balance assessment: Needs assistance Sitting-balance support: Bilateral upper extremity supported;Feet supported Sitting balance-Leahy Scale: Fair Sitting balance - Comments: slight posterior trunk lean during LE movement;    Standing balance support: Bilateral upper extremity supported;During functional activity Standing balance-Leahy Scale: Poor  Pertinent Vitals/Pain Pain Assessment: No/denies pain    Home Living Family/patient expects to be discharged to:: Private residence Living Arrangements: Children Available Help at Discharge: Family;Available PRN/intermittently(son does work during day; he is there during the evening; ) Type of Home: House Home Access:  Ramped entrance;Stairs to enter Entrance Stairs-Rails: None Entrance Stairs-Number of Steps: 2 Home Layout: One level Home Equipment: [place] - 2 wheels;Walker - standard;Grab bars - tub/shower;Shower seat - built in;Wheelchair - power;Wheelchair - manual;Bedside commode      Prior Function Level of Independence: Needs assistance   Gait / Transfers Assistance Needed: used RW for ambulation; limited to short distances only;   ADL's / Homemaking Assistance Needed: son did help with cooking/cleaning; He states that up until a week ago patient could do some cooking;         Hand Dominance        Extremity/Trunk Assessment   Upper Extremity Assessment Upper Extremity Assessment: Overall WFL for tasks assessed    Lower Extremity Assessment Lower Extremity Assessment: RLE deficits/detail;LLE deficits/detail RLE Deficits / Details: intact light touch sensation; ROM is WFL; strength is grossly 3/5 bilaterally;  LLE Deficits / Details: intact light touch sensation; ROM is WFL; strength is grossly 3/5 bilaterally;     Cervical / Trunk Assessment Cervical / Trunk Assessment: Kyphotic  Communication   Communication: HOH  Cognition Arousal/Alertness: Awake/alert Behavior During Therapy: WFL for tasks assessed/performed Overall Cognitive Status: Within Functional Limits for tasks assessed                                 General Comments: oriented x4;       General Comments General comments (skin integrity, edema, etc.): does have increased swelling in BLEs;     Exercises     Assessment/Plan    PT Assessment Patient needs continued PT services  PT Problem List Decreased strength;Cardiopulmonary status limiting activity;Decreased activity tolerance;Decreased knowledge of use of DME;Decreased balance;Decreased safety awareness;Decreased mobility       PT Treatment Interventions DME instruction;Therapeutic activities;Gait training;Therapeutic exercise;Patient/family  education;Stair training;Balance training;Functional mobility training;Neuromuscular re-education    PT Goals (Current goals can be found in the Care Plan section)  Acute Rehab PT Goals Patient Stated Goal: "I want to get better" PT Goal Formulation: With patient Time For Goal Achievement: 05/19/17 Potential to Achieve Goals: Good    Frequency Min 2X/week   Barriers to discharge Inaccessible home environment;Decreased caregiver support home alone during the day; has ramped entrance but also has stairs to enter;     Co-evaluation               AM-PAC PT "6 Clicks" Daily Activity  Outcome Measure Difficulty turning over in bed (including adjusting bedclothes, sheets and blankets)?: A Lot Difficulty moving from lying on back to sitting on the side of the bed? : Unable Difficulty sitting down on and standing up from a chair with arms (e.g., wheelchair, bedside commode, etc,.)?: A Lot Help needed moving to and from a bed to chair (including a wheelchair)?: A Lot Help needed walking in hospital room?: A Lot Help needed climbing 3-5 steps with a railing? : Total 6 Click Score: 10    End of Session Equipment Utilized During Treatment: Gait belt Activity Tolerance: Patient limited by fatigue Patient left: in bed;with family/visitor present Nurse Communication: Mobility status PT Visit Diagnosis: Unsteadiness on feet (R26.81);Muscle weakness (generalized) (M62.81)    Time: 2585-2778 PT Time Calculation (  min) (ACUTE ONLY): 24 min   Charges:   PT Evaluation $PT Eval Low Complexity: 1 Low     PT G Codes:          Trotter,Margaret PT, DPT 05/05/2017, 1:49 PM

## 2017-05-05 NOTE — Progress Notes (Signed)
Patient ID: Lisa Haney, female   DOB: 1926-08-18, 84 y.o.   MRN: [id number]  [facility] PROGRESS NOTE  MIAMI LATULIPPE QQV:[id number] DOB: 1926-08-03 DOA: 05/04/2017 PCP: Glendon Axe, MD  HPI/Subjective: Patient not feeling well.  She has been having constipation.  Some pain in her abdomen.  Short of breath and coughing.  She has been on numerous antibiotics for ear infections.  Feeling very weak.  Feeling dizzy.  Objective: Vitals:   05/05/17 1043 05/05/17 1245  BP: (!) 158/50   Pulse: 64 78  Resp:    Temp:    SpO2:  (!) 89%    Filed Weights   05/04/17 1247  Weight: 89.4 kg (197 lb)    ROS: Review of Systems  Constitutional: Negative for chills and fever.  Eyes: Negative for blurred vision.  Respiratory: Positive for cough, shortness of breath and wheezing.   Cardiovascular: Negative for chest pain.  Gastrointestinal: Positive for constipation. Negative for abdominal pain, diarrhea, nausea and vomiting.  Genitourinary: Negative for dysuria.  Musculoskeletal: Negative for joint pain.  Neurological: Negative for dizziness and headaches.   Exam: Physical Exam  Constitutional: She is oriented to person, place, and time.  HENT:  Nose: No mucosal edema.  Mouth/Throat: No oropharyngeal exudate or posterior oropharyngeal edema.  Eyes: Conjunctivae, EOM and lids are normal. Pupils are equal, round, and reactive to light.  Neck: No JVD present. Carotid bruit is not present. No edema present. No thyroid mass and no thyromegaly present.  Cardiovascular: S1 normal and S2 normal. Exam reveals no gallop.  No murmur heard. Pulses:      Dorsalis pedis pulses are 2+ on the right side, and 2+ on the left side.  Respiratory: No respiratory distress. She has decreased breath sounds in the right upper field, the right middle field, the right lower field, the left upper field, the left middle field and the left lower field. She has wheezes in the right middle field, the right  lower field, the left middle field and the left lower field. She has no rhonchi. She has no rales.  GI: Soft. Bowel sounds are normal. There is no tenderness.  Musculoskeletal:       Right ankle: She exhibits swelling.       Left ankle: She exhibits swelling.  Lymphadenopathy:    She has no cervical adenopathy.  Neurological: She is alert and oriented to person, place, and time. No cranial nerve deficit.  Skin: Skin is warm. No rash noted. Nails show no clubbing.  Psychiatric: She has a normal mood and affect.      Data Reviewed: Basic Metabolic Panel: Recent Labs  Lab 04/28/17 1600 05/04/17 1404  NA 132* 133*  K 4.4 3.4*  CL 95* 95*  CO2 29 30  GLUCOSE 84 103*  BUN 27* 24*  CREATININE 1.13* 0.94  CALCIUM 8.5* 8.5*   CBC: Recent Labs  Lab 04/28/17 1600 05/04/17 1404  WBC 3.6 12.2*  NEUTROABS 1.9 9.9*  HGB 13.9 14.7  HCT 41.7 44.4  MCV 87.5 87.4  PLT 88* 144*   Cardiac Enzymes: Recent Labs  Lab 04/28/17 1600 05/04/17 1404  TROPONINI <0.03 <0.03     Recent Results (from the past 240 hour(s))  Culture, blood (routine x 2)     Status: None (Preliminary result)   Collection Time: 05/04/17  4:16 PM  Result Value Ref Range Status   Specimen Description BLOOD  Final   Special Requests NONE  Final   Culture   Final  NO GROWTH < 24 HOURS Performed at [facility], [address] [place], [place] [address]    Report Status PENDING  Incomplete  Culture, blood (routine x 2)     Status: None (Preliminary result)   Collection Time: 05/04/17  4:44 PM  Result Value Ref Range Status   Specimen Description BLOOD RIGHT ARM  Final   Special Requests   Final    BOTTLES DRAWN AEROBIC AND ANAEROBIC Blood Culture adequate volume   Culture   Final    NO GROWTH < 24 HOURS Performed at [facility], [address] [place], [place] [address]    Report Status PENDING  Incomplete     Studies: Dg Chest 2 View  Result Date: 05/04/2017 CLINICAL DATA:   Shortness of breath, recent pneumonia EXAM: CHEST  2 VIEW COMPARISON:  04/28/2017 FINDINGS: Patchy bilateral lower lobe opacities, suspicious for pneumonia. No pleural effusion or pneumothorax. Mild cardiomegaly. Mild degenerative changes of the visualized thoracolumbar spine. IMPRESSION: Suspected bilateral lower lobe pneumonia, grossly unchanged. Electronically Signed   By: Julian Hy M.D.   On: 05/04/2017 13:43   Ct Head Wo Contrast  Result Date: 05/04/2017 CLINICAL DATA:  Increased weakness, unable to walk, history COPD, coronary artery disease post stenting, hypertension, stroke EXAM: CT HEAD WITHOUT CONTRAST TECHNIQUE: Contiguous axial images were obtained from the base of the skull through the vertex without intravenous contrast. Sagittal and coronal MPR images reconstructed from axial data set. COMPARISON:  09/18/2010 FINDINGS: Brain: Generalized atrophy. Normal ventricular morphology. No midline shift or mass effect. Old RIGHT occipital infarct. Small vessel chronic ischemic changes of deep cerebral white matter. Old lacunar infarcts at RIGHT basal ganglia. No intracranial hemorrhage, mass lesion, or evidence acute infarction. No extra-axial fluid collections. Vascular: Atherosclerotic calcification of internal carotid arteries bilaterally at skullbase Skull: Intact Sinuses/Orbits: Tiny fluid levels dependently in the maxillary sinuses. Prior maxillary sinus decompressions bilaterally with resection of turbinates. Other: N/A IMPRESSION: Atrophy with small vessel chronic ischemic changes of deep cerebral white matter. Old lacunar infarcts RIGHT basal ganglia and old RIGHT occipital infarct. No acute intracranial abnormalities. Electronically Signed   By: Lavonia Dana M.D.   On: 05/04/2017 16:12    Scheduled Meds: . albuterol  3 mL Inhalation Q6H  . amLODipine  5 mg Oral Daily  . aspirin EC  81 mg Oral Daily  . azithromycin  250 mg Oral Daily  . budesonide (PULMICORT) nebulizer solution  0.5  mg Nebulization BID  . cholecalciferol  1,000 Units Oral Daily  . cloNIDine  0.2 mg Oral Daily  . enoxaparin (LOVENOX) injection  40 mg Subcutaneous Q24H  . furosemide  60 mg Oral Daily  . isosorbide dinitrate  20 mg Oral TID  . lactulose  20 g Oral Once  . levothyroxine  150 mcg Oral QAC breakfast  . lisinopril  20 mg Oral Daily  . methylPREDNISolone (SOLU-MEDROL) injection  40 mg Intravenous Daily  . metoprolol tartrate  100 mg Oral BID  . pravastatin  40 mg Oral QHS  . senna  1 tablet Oral BID  . venlafaxine XR  75 mg Oral Q breakfast   Continuous Infusions: . cefTRIAXone (ROCEPHIN)  IV Stopped (05/05/17 0736)  . vancomycin    . vancomycin      Assessment/Plan:  1. Pneumonia, leukocytosis.  Patient has been on numerous antibiotic courses for ear infections with ENT.  Patient placed on Rocephin and Zithromax.  Family states that she was recently put on a Bactrim nose wash.  I will add vancomycin for right now.  Blood cultures so far negative.  Try to obtain a sputum culture.  Obtain flu swab and MRSA PCR. 2. COPD exacerbation start Solu-Medrol nebulizer treatments with steroids 3. Weakness.  Physical therapy recommended rehab 4. Essential hypertension continue Norvasc, metoprolol and clonidine 5. Hyperlipidemia unspecified discontinue pravastatin for now 6. Thrombocytopenia seems to be improving.  Could have initially started off as a viral infection 7. Hypothyroidism unspecified on levothyroxine  Code Status:     Code Status Orders  (From admission, onward)        Start     Ordered   05/04/17 1755  Full code  Continuous     05/04/17 1755    Code Status History    Date Active Date Inactive Code Status Order ID Comments User Context   06/21/2016 09:59 06/21/2016 14:13 Full Code [id number]  Isaias Cowman, MD Inpatient   02/13/2016 04:17 02/15/2016 20:21 Full Code [id number]  Harvie Bridge, DO Inpatient    Advance Directive Documentation     Most Recent Value   Type of Advance Directive  Healthcare Power of Attorney  Pre-existing out of facility DNR order (yellow form or pink MOST form)  No data  "MOST" Form in Place?  No data     Family Communication: Son at the bedside Disposition Plan: Physical therapy recommended rehab  Antibiotics:  Vancomycin  Rocephin  Zithromax  Time spent: 28 minutes  [place]

## 2017-05-05 NOTE — Progress Notes (Signed)
Pts Influenza A PCR is positive, MD Harvey [place] notified. Orders received to consult pharmacy dosage for Tamiflu. Per Donna Christen, pt should get Tamiflu 30 mg BID X5 days. Orders also received to d/c Vancomycin. Orders placed

## 2017-05-05 NOTE — ED Notes (Signed)
ED provider made aware of difficulty obtaining blood cultures, 4 attempts made to obtain blood for cultures and labs. EJ unable to provide ample blood for cultures.  ED provider stated to start antibiotics without waiting for blood cultures. Waiting on lab to draw blood cultures.

## 2017-05-05 NOTE — Progress Notes (Signed)
Pharmacy Antibiotic Note  Lisa Haney is a 84 y.o. female admitted on 05/04/2017 with pneumonia.  Pharmacy has been consulted for vancomycin dosing.  Plan: Vancomycin 1000mg   IV every 12 hours.  Goal trough 15-20 mcg/mL.  Height: 5\' 1"  (154.9 cm) Weight: 197 lb (89.4 kg) IBW/kg (Calculated) : 47.8  Temp (24hrs), Avg:98.3 F (36.8 C), Min:97.8 F (36.6 C), Max:98.7 F (37.1 C)  Recent Labs  Lab 04/28/17 1600 05/04/17 1404 05/04/17 1420 05/04/17 1644  WBC 3.6 12.2*  --   --   CREATININE 1.13* 0.94  --   --   LATICACIDVEN  --   --  0.9 1.1    Estimated Creatinine Clearance: 42.1 mL/min (by C-G formula based on SCr of 0.94 mg/dL).    Allergies  Allergen Reactions  . Doxycycline Other (See Comments)    Severe upset stomach and pain     Antimicrobials this admission: Anti-infectives (From admission, onward)   Start     Dose/Rate Route Frequency Ordered Stop   05/05/17 2000  vancomycin (VANCOCIN) IVPB 1000 mg/200 mL premix     1,000 mg 200 mL/hr over 60 Minutes Intravenous Every 12 hours 05/05/17 1324     05/05/17 1330  vancomycin (VANCOCIN) IVPB 1000 mg/200 mL premix     1,000 mg 200 mL/hr over 60 Minutes Intravenous  Once 05/05/17 1320     05/05/17 1000  azithromycin (ZITHROMAX) tablet 250 mg     250 mg Oral Daily 05/04/17 1755     05/05/17 0000  cefTRIAXone (ROCEPHIN) 1 g in dextrose 5 % 50 mL IVPB     1 g 100 mL/hr over 30 Minutes Intravenous Every 24 hours 05/04/17 1755     05/04/17 1530  azithromycin (ZITHROMAX) 500 mg in dextrose 5 % 250 mL IVPB     500 mg 250 mL/hr over 60 Minutes Intravenous  Once 05/04/17 1515 05/04/17 1800   05/04/17 1515  cefTRIAXone (ROCEPHIN) 1 g in dextrose 5 % 50 mL IVPB - Premix     1 g 100 mL/hr over 30 Minutes Intravenous  Once 05/04/17 1515 05/04/17 1619      Microbiology results: Recent Results (from the past 240 hour(s))  Culture, blood (routine x 2)     Status: None (Preliminary result)   Collection Time: 05/04/17  4:16 PM   Result Value Ref Range Status   Specimen Description BLOOD  Final   Special Requests NONE  Final   Culture   Final    NO GROWTH < 24 HOURS Performed at [facility], [address] [place], [place] [address]    Report Status PENDING  Incomplete  Culture, blood (routine x 2)     Status: None (Preliminary result)   Collection Time: 05/04/17  4:44 PM  Result Value Ref Range Status   Specimen Description BLOOD RIGHT ARM  Final   Special Requests   Final    BOTTLES DRAWN AEROBIC AND ANAEROBIC Blood Culture adequate volume   Culture   Final    NO GROWTH < 24 HOURS Performed at [facility], [address] [place], [place] [address]    Report Status PENDING  Incomplete    Thank you for allowing pharmacy to be a part of this patient's care. Verified with Dr Leslye Peer he would like azithro, rocephin, and also vanco.    Liahna Brickner 05/05/2017 1:25 PM

## 2017-05-06 LAB — CBC
HEMATOCRIT: 40.3 % (ref 35.0–47.0)
HEMOGLOBIN: 13.7 g/dL (ref 12.0–16.0)
MCH: 29.4 pg (ref 26.0–34.0)
MCHC: 34 g/dL (ref 32.0–36.0)
MCV: 86.4 fL (ref 80.0–100.0)
Platelets: 180 10*3/uL (ref 150–440)
RBC: 4.67 MIL/uL (ref 3.80–5.20)
RDW: 14.4 % (ref 11.5–14.5)
WBC: 8.9 10*3/uL (ref 3.6–11.0)

## 2017-05-06 LAB — PROCALCITONIN: Procalcitonin: <0.1 ng/mL

## 2017-05-06 LAB — BASIC METABOLIC PANEL
Anion gap: 8 (ref 5–15)
BUN: 16 mg/dL (ref 6–20)
CHLORIDE: 97 mmol/L — AB (ref 101–111)
CO2: 28 mmol/L (ref 22–32)
Calcium: 8.4 mg/dL — ABNORMAL LOW (ref 8.9–10.3)
Creatinine, Ser: 0.83 mg/dL (ref 0.44–1.00)
GFR calc non Af Amer: >60 mL/min (ref >60)
Glucose, Bld: 166 mg/dL — ABNORMAL HIGH (ref 65–99)
POTASSIUM: 3.8 mmol/L (ref 3.5–5.1)
SODIUM: 133 mmol/L — AB (ref 135–145)

## 2017-05-06 LAB — CK: Total CK: 18 U/L — ABNORMAL LOW (ref 38–234)

## 2017-05-06 NOTE — Progress Notes (Signed)
Patient ID: Lisa Haney, female   DOB: [birth year], 84 y.o.   MRN: [id number]  [facility] PROGRESS NOTE  Lisa Haney [name] DOB: 07/09/1926 DOA: 05/04/2017 PCP: Lisa Axe, MD  HPI/Subjective: Patient still feeling weak and dizzy.  Some cough.  Still wheezing and short of breath.  Poor appetite.  Objective: Vitals:   05/06/17 0321 05/06/17 0837  BP: (!) 181/70 (!) 168/53  Pulse: 77 82  Resp: 18 15  Temp: 97.9 F (36.6 C) 97.7 F (36.5 C)  SpO2: 92% 92%    Filed Weights   05/04/17 1247  Weight: 89.4 kg (197 lb)    ROS: Review of Systems  Constitutional: Negative for chills and fever.  Eyes: Negative for blurred vision.  Respiratory: Positive for cough, shortness of breath and wheezing.   Cardiovascular: Negative for chest pain.  Gastrointestinal: Positive for constipation. Negative for abdominal pain, diarrhea, nausea and vomiting.  Genitourinary: Negative for dysuria.  Musculoskeletal: Negative for joint pain.  Neurological: Negative for dizziness and headaches.   Exam: Physical Exam  Constitutional: She is oriented to person, place, and time.  HENT:  Nose: No mucosal edema.  Mouth/Throat: No oropharyngeal exudate or posterior oropharyngeal edema.  Eyes: Conjunctivae, EOM and lids are normal. Pupils are equal, round, and reactive to light.  Neck: No JVD present. Carotid bruit is not present. No edema present. No thyroid mass and no thyromegaly present.  Cardiovascular: S1 normal and S2 normal. Exam reveals no gallop.  No murmur heard. Pulses:      Dorsalis pedis pulses are 2+ on the right side, and 2+ on the left side.  Respiratory: No respiratory distress. She has decreased breath sounds in the right middle field, the right lower field, the left middle field and the left lower field. She has wheezes in the right middle field, the right lower field, the left middle field and the left lower field. She has no rhonchi. She has no rales.  Little  better air entry today.  Wheezes expiratory in nature.  GI: Soft. Bowel sounds are normal. There is no tenderness.  Musculoskeletal:       Right ankle: She exhibits swelling.       Left ankle: She exhibits swelling.  Lymphadenopathy:    She has no cervical adenopathy.  Neurological: She is alert and oriented to person, place, and time. No cranial nerve deficit.  Skin: Skin is warm. No rash noted. Nails show no clubbing.  Psychiatric: She has a normal mood and affect.      Data Reviewed: Basic Metabolic Panel: Recent Labs  Lab 05/04/17 1404 05/06/17 0308  NA 133* 133*  K 3.4* 3.8  CL 95* 97*  CO2 30 28  GLUCOSE 103* 166*  BUN 24* 16  CREATININE 0.94 0.83  CALCIUM 8.5* 8.4*   CBC: Recent Labs  Lab 05/04/17 1404 05/06/17 0308  WBC 12.2* 8.9  NEUTROABS 9.9*  --   HGB 14.7 13.7  HCT 44.4 40.3  MCV 87.4 86.4  PLT 144* 180   Cardiac Enzymes: Recent Labs  Lab 05/04/17 1404 05/06/17 0308  CKTOTAL  --  18*  TROPONINI <0.03  --      Recent Results (from the past 240 hour(s))  Culture, blood (routine x 2)     Status: None (Preliminary result)   Collection Time: 05/04/17  4:16 PM  Result Value Ref Range Status   Specimen Description BLOOD  Final   Special Requests NONE  Final   Culture   Final  NO GROWTH 2 DAYS Performed at [facility], [address] [place], [place] [address]    Report Status PENDING  Incomplete  Culture, blood (routine x 2)     Status: None (Preliminary result)   Collection Time: 05/04/17  4:44 PM  Result Value Ref Range Status   Specimen Description BLOOD RIGHT ARM  Final   Special Requests   Final    BOTTLES DRAWN AEROBIC AND ANAEROBIC Blood Culture adequate volume   Culture   Final    NO GROWTH 2 DAYS Performed at [facility], [address], [place], [place] [address]    Report Status PENDING  Incomplete  Culture, expectorated sputum-assessment     Status: None   Collection Time: 05/05/17  1:13 PM  Result  Value Ref Range Status   Specimen Description SPU  Final   Special Requests Normal  Final   Sputum evaluation   Final    Sputum specimen not acceptable for testing.  Please recollect.   SPOKE TO ANNA [place] 05/05/17 @ [place] Performed at [facility], [address] [place], [place] [address]    Report Status 05/05/2017 FINAL  Final  MRSA PCR Screening     Status: None   Collection Time: 05/05/17  3:15 PM  Result Value Ref Range Status   MRSA by PCR NEGATIVE NEGATIVE Final    Comment:        The GeneXpert MRSA Assay (FDA approved for NASAL specimens only), is one component of a comprehensive MRSA colonization surveillance program. It is not intended to diagnose MRSA infection nor to guide or monitor treatment for MRSA infections. Performed at [facility], [address] [place], [place] [address]      Studies: Dg Chest 2 View  Result Date: 05/04/2017 CLINICAL DATA:  Shortness of breath, recent pneumonia EXAM: CHEST  2 VIEW COMPARISON:  04/28/2017 FINDINGS: Patchy bilateral lower lobe opacities, suspicious for pneumonia. No pleural effusion or pneumothorax. Mild cardiomegaly. Mild degenerative changes of the visualized thoracolumbar spine. IMPRESSION: Suspected bilateral lower lobe pneumonia, grossly unchanged. Electronically Signed   By: Julian Hy M.D.   On: 05/04/2017 13:43   Ct Head Wo Contrast  Result Date: 05/04/2017 CLINICAL DATA:  Increased weakness, unable to walk, history COPD, coronary artery disease post stenting, hypertension, stroke EXAM: CT HEAD WITHOUT CONTRAST TECHNIQUE: Contiguous axial images were obtained from the base of the skull through the vertex without intravenous contrast. Sagittal and coronal MPR images reconstructed from axial data set. COMPARISON:  09/18/2010 FINDINGS: Brain: Generalized atrophy. Normal ventricular morphology. No midline shift or mass effect. Old RIGHT occipital infarct. Small vessel chronic ischemic  changes of deep cerebral white matter. Old lacunar infarcts at RIGHT basal ganglia. No intracranial hemorrhage, mass lesion, or evidence acute infarction. No extra-axial fluid collections. Vascular: Atherosclerotic calcification of internal carotid arteries bilaterally at skullbase Skull: Intact Sinuses/Orbits: Tiny fluid levels dependently in the maxillary sinuses. Prior maxillary sinus decompressions bilaterally with resection of turbinates. Other: N/A IMPRESSION: Atrophy with small vessel chronic ischemic changes of deep cerebral white matter. Old lacunar infarcts RIGHT basal ganglia and old RIGHT occipital infarct. No acute intracranial abnormalities. Electronically Signed   By: Lavonia Dana M.D.   On: 05/04/2017 16:12    Scheduled Meds: . albuterol  3 mL Inhalation Q6H  . amLODipine  5 mg Oral Daily  . aspirin EC  81 mg Oral Daily  . budesonide (PULMICORT) nebulizer solution  0.5 mg Nebulization BID  . cholecalciferol  1,000 Units Oral Daily  . cloNIDine  0.2 mg Oral Daily  . enoxaparin (LOVENOX) injection  40 mg Subcutaneous Q24H  . isosorbide dinitrate  20 mg Oral TID  . levothyroxine  150 mcg Oral QAC breakfast  . lisinopril  20 mg Oral Daily  . methylPREDNISolone (SOLU-MEDROL) injection  40 mg Intravenous Daily  . metoprolol tartrate  100 mg Oral BID  . oseltamivir  30 mg Oral BID  . senna  1 tablet Oral BID  . venlafaxine XR  75 mg Oral Q breakfast   Continuous Infusions:   Assessment/Plan:  1. Influenza A positive.  Started Tamiflu.  Pneumonia could be viral in nature.  Since pro-calcitonin is negative and patient's been on numerous antibiotics recently I will stop antibiotics. 2. COPD exacerbation.  Continue Solu-Medrol nebulizer treatments with steroids 3. Weakness.  Physical therapy recommended rehab 4. Essential hypertension continue Norvasc, metoprolol and clonidine 5. Hyperlipidemia unspecified discontinue pravastatin for now 6. Thrombocytopenia seems to be improving.   Likely from viral infection 7. Hypothyroidism unspecified on levothyroxine 8. Chronic respiratory failure on oxygen  Code Status:     Code Status Orders  (From admission, onward)        Start     Ordered   05/04/17 1755  Full code  Continuous     05/04/17 1755    Code Status History    Date Active Date Inactive Code Status Order ID Comments User Context   06/21/2016 09:59 06/21/2016 14:13 Full Code [id number]  Isaias Cowman, MD Inpatient   02/13/2016 04:17 02/15/2016 20:21 Full Code [id number]  Harvie Bridge, DO Inpatient    Advance Directive Documentation     Most Recent Value  Type of Advance Directive  Healthcare Power of Attorney  Pre-existing out of facility DNR order (yellow form or pink MOST form)  No data  "MOST" Form in Place?  No data     Family Communication: Son at the bedside Disposition Plan: Physical therapy recommended rehab  Antibiotics:  Tamiflu  Time spent: 26 minutes  [place]

## 2017-05-06 NOTE — Clinical Social Work Note (Signed)
Clinical Social Work Assessment  Patient Details  Name: Lisa Haney MRN: [id number] Date of Birth: August 13, 1926  Date of referral:  05/06/17               Reason for consult:  Facility Placement                Permission sought to share information with:  Chartered certified accountant granted to share information::  Yes, Verbal Permission Granted  Name::      [place]::   [place]   Relationship::     Contact Information:     Housing/Transportation Living arrangements for the past 2 months:  [place] of Information:  Patient Patient Interpreter Needed:  None Criminal Activity/Legal Involvement Pertinent to Current Situation/Hospitalization:  No - Comment as needed Significant Relationships:  Adult Children Lives with:  Adult Children Do you feel safe going back to the place where you live?  Yes Need for family participation in patient care:  Yes (Comment)  Care giving concerns:  Patient lives in [place] with [name].    Social Worker assessment / plan:  Holiday representative (CSW) received SNF consult. PT is recommending SNF. Patient is flu positive. CSW contacted patient to discuss D/C plan. Patient was alert and oriented X4 and reported that she lives in [place] with [name]. Per patient she has her home c-pap machine at [facility] CSW explained SNF process and that medicare requires a 3 night qualifying inpatient stay in the hospital in order to pay for SNF. Patient was admitted to inpatient on 05/04/17. CSW also explained to patient that her bed offers will be limited because of the flu. Patient verbalized her understanding. FL2 complete and faxed out.   CSW presented bed offers to patient. She chose [facility]. Per [facility] admissions coordinator at [facility] patient will need 3 doses of tamiflu before she can come to [facility]. Per RN patient has had 2 doses of tamiflu already. CSW will  continue to follow and assist as needed.   Employment status:  Retired Forensic scientist:  Medicare PT Recommendations:  [place] / Referral to community resources:  [place]  Patient/Family's Response to care:  Patient is agreeable to D/C to [facility].   Patient/Family's Understanding of and Emotional Response to Diagnosis, Current Treatment, and Prognosis:  Patient was very pleasant and thanked CSW for assistance.   Emotional Assessment Appearance:  Appears stated age Attitude/Demeanor/Rapport:    Affect (typically observed):  Accepting, Adaptable, Pleasant Orientation:  Oriented to Self, Oriented to Place, Oriented to  Time, Oriented to Situation Alcohol / Substance use:  Not Applicable Psych involvement (Current and /or in the community):  No (Comment)  Discharge Needs  Concerns to be addressed:  Discharge Planning Concerns Readmission within the last 30 days:  No Current discharge risk:  Dependent with Mobility Barriers to Discharge:  Continued Medical Work up   [employer], Veronia Beets, LCSW 05/06/2017, 2:01 PM

## 2017-05-06 NOTE — Clinical Social Work Placement (Signed)
   CLINICAL SOCIAL WORK PLACEMENT  NOTE  Date:  05/06/2017  Patient Details  Name: MARYFRANCES PORTUGAL MRN: [id number] Date of Birth: October 11, 1926  Clinical Social Work is seeking post-discharge placement for this patient at the [place] level of care (*CSW will initial, date and re-position this form in  chart as items are completed):  Yes   Patient/family provided with [facility] [place] Work Department's list of facilities offering this level of care within the geographic area requested by the patient (or if unable, by the patient's family).  Yes   Patient/family informed of their freedom to choose among providers that offer the needed level of care, that participate in Medicare, Medicaid or managed care program needed by the patient, have an available bed and are willing to accept the patient.  Yes   Patient/family informed of [facility]'s ownership interest in [facility] and [facility], as well as of the fact that they are under no obligation to receive care at these facilities.  PASRR submitted to EDS on 05/06/17     PASRR number received on 05/06/17     Existing PASRR number confirmed on       FL2 transmitted to all facilities in geographic area requested by pt/family on 05/06/17     FL2 transmitted to all facilities within larger geographic area on       Patient informed that his/her managed care company has contracts with or will negotiate with certain facilities, including the following:        Yes   Patient/family informed of bed offers received.  Patient chooses bed at [facility] )     Physician recommends and patient chooses bed at      Patient to be transferred to   on  .  Patient to be transferred to facility by       Patient family notified on   of transfer.  Name of family member notified:        PHYSICIAN       Additional Comment:    _______________________________________________ Eugene Isadore, Veronia Beets,  LCSW 05/06/2017, 2:00 PM

## 2017-05-06 NOTE — NC FL2 (Signed)
[place] LEVEL OF CARE SCREENING TOOL     IDENTIFICATION  Patient Name: Lisa Haney Birthdate: 11/30/1926 Sex: female Admission Date (Current Location): 05/04/2017  [place] and [place] Number:  Engineering geologist and Address:  [facility], [address], [place], [place] [address]      Provider Number: [id number]  Attending Physician Name and Address:  Loletha Grayer, MD  Relative Name and Phone Number:       Current Level of Care: Hospital Recommended Level of Care: [place] Prior Approval Number:    Date Approved/Denied:   PASRR Number: [id number])  Discharge Plan: SNF    Current Diagnoses: Patient Active Problem List   Diagnosis Date Noted  . Pneumonia 05/04/2017  . COPD exacerbation ([place]) 02/13/2016    Orientation RESPIRATION BLADDER Height & Weight     Self, Time, Situation, Place  O2(2 Liters Oxygen. ) Incontinent Weight: 197 lb (89.4 kg) Height:  5\' 1"  (154.9 cm)  BEHAVIORAL SYMPTOMS/MOOD NEUROLOGICAL BOWEL NUTRITION STATUS      Incontinent Diet(Diet: Heart Healthy )  AMBULATORY STATUS COMMUNICATION OF NEEDS Skin   Extensive Assist Verbally Normal                       Personal Care Assistance Level of Assistance  Bathing, Feeding, Dressing Bathing Assistance: Limited assistance Feeding assistance: Independent Dressing Assistance: Limited assistance     Functional Limitations Info  Sight, Hearing, Speech Sight Info: Adequate Hearing Info: Adequate Speech Info: Adequate    SPECIAL CARE FACTORS FREQUENCY  PT (By licensed PT), OT (By licensed OT)     PT Frequency: (5) OT Frequency: (5)            Contractures      Additional Factors Info  Code Status, Allergies, Isolation Precautions Code Status Info: (Full Code. ) Allergies Info: (Doxycycline)     Isolation Precautions Info: (Droplet precautions: Flu A positive. MRSA Nasal Swab. )     Current Medications  (05/06/2017):  This is the current hospital active medication list Current Facility-Administered Medications  Medication Dose Route Frequency Provider Last Rate Last Dose  . acetaminophen (TYLENOL) tablet 650 mg  650 mg Oral Q6H PRN Fritzi Mandes, MD       Or  . acetaminophen (TYLENOL) suppository 650 mg  650 mg Rectal Q6H PRN Fritzi Mandes, MD      . albuterol (PROVENTIL) (2.5 MG/3ML) 0.083% nebulizer solution 3 mL  3 mL Inhalation Q6H Loletha Grayer, MD   3 mL at 05/06/17 0731  . amLODipine (NORVASC) tablet 5 mg  5 mg Oral Daily Fritzi Mandes, MD   5 mg at 05/05/17 0903  . aspirin EC tablet 81 mg  81 mg Oral Daily Fritzi Mandes, MD   81 mg at 05/05/17 1041  . azithromycin (ZITHROMAX) tablet 250 mg  250 mg Oral Daily Fritzi Mandes, MD   250 mg at 05/05/17 1041  . budesonide (PULMICORT) nebulizer solution 0.5 mg  0.5 mg Nebulization BID Loletha Grayer, MD   0.5 mg at 05/06/17 0731  . cefTRIAXone (ROCEPHIN) 1 g in dextrose 5 % 50 mL IVPB  1 g Intravenous Q24H Fritzi Mandes, MD 100 mL/hr at 05/06/17 0532 1 g at 05/06/17 0532  . cholecalciferol (VITAMIN D) tablet 1,000 Units  1,000 Units Oral Daily Fritzi Mandes, MD   1,000 Units at 05/05/17 1042  . cloNIDine (CATAPRES) tablet 0.2 mg  0.2 mg Oral Daily Fritzi Mandes, MD      .  cyclobenzaprine (FLEXERIL) tablet 5 mg  5 mg Oral BID PRN Fritzi Mandes, MD      . enoxaparin (LOVENOX) injection 40 mg  40 mg Subcutaneous Q24H Fritzi Mandes, MD   40 mg at 05/05/17 2211  . isosorbide dinitrate (ISORDIL) tablet 20 mg  20 mg Oral TID Fritzi Mandes, MD   20 mg at 05/05/17 2211  . levothyroxine (SYNTHROID, LEVOTHROID) tablet 150 mcg  150 mcg Oral QAC breakfast Fritzi Mandes, MD      . lisinopril (PRINIVIL,ZESTRIL) tablet 20 mg  20 mg Oral Daily Fritzi Mandes, MD   20 mg at 05/05/17 0902  . methylPREDNISolone sodium succinate (SOLU-MEDROL) 40 mg/mL injection 40 mg  40 mg Intravenous Daily Loletha Grayer, MD   40 mg at 05/05/17 1522  . metoprolol tartrate (LOPRESSOR) tablet 100 mg  100  mg Oral BID Fritzi Mandes, MD   100 mg at 05/05/17 2210  . ondansetron (ZOFRAN) tablet 4 mg  4 mg Oral Q6H PRN Fritzi Mandes, MD       Or  . ondansetron [facility]) injection 4 mg  4 mg Intravenous Q6H PRN Fritzi Mandes, MD      . ondansetron (ZOFRAN-ODT) disintegrating tablet 4 mg  4 mg Oral Q8H PRN Fritzi Mandes, MD      . oseltamivir (TAMIFLU) capsule 30 mg  30 mg Oral BID Loletha Grayer, MD   30 mg at 05/05/17 2210  . polyethylene glycol (MIRALAX / GLYCOLAX) packet 17 g  17 g Oral Daily PRN Fritzi Mandes, MD      . senna (SENOKOT) tablet 8.6 mg  1 tablet Oral BID Fritzi Mandes, MD   8.6 mg at 05/05/17 2211  . traMADol (ULTRAM) tablet 50 mg  50 mg Oral Q6H PRN Fritzi Mandes, MD   50 mg at 05/05/17 1424  . venlafaxine XR (EFFEXOR-XR) 24 hr capsule 75 mg  75 mg Oral Q breakfast Fritzi Mandes, MD   75 mg at 05/05/17 2297     Discharge Medications: Please see discharge summary for a list of discharge medications.  Relevant Imaging Results:  Relevant Lab Results:   Additional Information (SSN: [id number])  Ceasar Decandia, Veronia Beets, LCSW

## 2017-05-07 LAB — PROCALCITONIN

## 2017-05-07 MED ORDER — METHYLPREDNISOLONE SODIUM SUCC 40 MG IJ SOLR
40.0000 mg | Freq: Two times a day (BID) | INTRAMUSCULAR | Status: DC
  Administered 2017-05-07 – 2017-05-09 (×4): 40 mg via INTRAVENOUS
  Filled 2017-05-07 (×4): qty 1

## 2017-05-07 MED ORDER — BENZONATATE 100 MG PO CAPS
100.0000 mg | ORAL_CAPSULE | Freq: Three times a day (TID) | ORAL | Status: DC
  Administered 2017-05-07 – 2017-05-09 (×6): 100 mg via ORAL
  Filled 2017-05-07 (×6): qty 1

## 2017-05-07 MED ORDER — NYSTATIN 100000 UNIT/ML MT SUSP
5.0000 mL | Freq: Four times a day (QID) | OROMUCOSAL | Status: DC
  Administered 2017-05-07 – 2017-05-09 (×8): 500000 [IU] via ORAL
  Filled 2017-05-07 (×8): qty 5

## 2017-05-07 MED ORDER — GUAIFENESIN 100 MG/5ML PO SOLN
5.0000 mL | ORAL | Status: DC | PRN
  Administered 2017-05-07: 100 mg via ORAL
  Filled 2017-05-07 (×4): qty 10

## 2017-05-07 MED ORDER — GUAIFENESIN 100 MG/5ML PO SOLN
5.0000 mL | ORAL | Status: DC | PRN
  Filled 2017-05-07: qty 5

## 2017-05-07 NOTE — Progress Notes (Signed)
Physical Therapy Treatment Patient Details Name: Lisa Haney MRN: [id number] DOB: 14-Jul-1926 Today's Date: 05/07/2017    History of Present Illness 84 yo Female came to ED with increased shortness of breath; She was diagnosed with pneumonia. Patient has a PMH significant for COPD, CAD, HTN; she is on 2L O2 at home at baseline. she lives at home with her son who does work during the day; patient reports having a decline in mobility in last week;     PT Comments    Pt agreeable to work with PT but initially hesitant and having some lightheadedness on getting to sitting.  Her BP was appropriate/stable both in supine and in sitting and after some initial dizziness she was able to stand/walk.  She was very slow and guarded with ambulation but actually did well with no LOBs, very little hands on assist.  Pt fatigued with the effort with sats dropping as low as 86% during ambulation (on 2 liters) these numbers came back up >92% after 1-2 minutes seated post ambulation with focused breathing.  Pt pleased that she was able to do what she did, but still obviously frustrated with how weak she feels.   Follow Up Recommendations  SNF;Supervision/Assistance - 24 hour     Equipment Recommendations  None recommended by PT;Other (comment)    Recommendations for Other Services       Precautions / Restrictions Precautions Precautions: Fall Restrictions Weight Bearing Restrictions: No    Mobility  Bed Mobility Overal bed mobility: Modified Independent Bed Mobility: Supine to Sit     Supine to sit: Supervision     General bed mobility comments: Pt showed good effort in getting sitting EOB, able to maintain sitting balance  Transfers Overall transfer level: Needs assistance Equipment used: Rolling walker (2 wheeled) Transfers: Sit to/from Stand Sit to Stand: Min assist         General transfer comment: Pt initially hesitant to stand, with cuing for set up and sequencing and light  tactile cuing to shift weight forward she was able to rise  Ambulation/Gait Ambulation/Gait assistance: Min guard Ambulation Distance (Feet): 35 Feet Assistive device: Rolling walker (2 wheeled)       General Gait Details: Pt again with slow, deliberate gait but was able to maintain a much more prolonged bout of ambulation than any previous attempt in the hospital.  She was on 2 liters t/o the effort and her sats did drop to mid/high 80s, but despite c/o fatigue she did not have excessive SOB or labored breathing   Stairs            Wheelchair Mobility    Modified Rankin (Stroke Patients Only)       Balance Overall balance assessment: Needs assistance Sitting-balance support: Bilateral upper extremity supported;Feet supported Sitting balance-Leahy Scale: Fair     Standing balance support: Bilateral upper extremity supported;During functional activity Standing balance-Leahy Scale: Fair                              Cognition Arousal/Alertness: Awake/alert Behavior During Therapy: WFL for tasks assessed/performed Overall Cognitive Status: Within Functional Limits for tasks assessed                                        Exercises General Exercises - Lower Extremity Ankle Circles/Pumps: AROM;10 reps Long Arc Quad: Strengthening;10 reps  Heel Slides: Strengthening;10 reps Hip ABduction/ADduction: Strengthening;10 reps Hip Flexion/Marching: Strengthening;10 reps    General Comments        Pertinent Vitals/Pain Pain Assessment: No/denies pain    Home Living                      Prior Function            PT Goals (current goals can now be found in the care plan section) Progress towards PT goals: Progressing toward goals    Frequency    Min 2X/week      PT Plan Current plan remains appropriate    Co-evaluation              AM-PAC PT "6 Clicks" Daily Activity  Outcome Measure  Difficulty turning over in  bed (including adjusting bedclothes, sheets and blankets)?: A Little Difficulty moving from lying on back to sitting on the side of the bed? : A Lot Difficulty sitting down on and standing up from a chair with arms (e.g., wheelchair, bedside commode, etc,.)?: A Little Help needed moving to and from a bed to chair (including a wheelchair)?: A Little Help needed walking in hospital room?: A Little Help needed climbing 3-5 steps with a railing? : A Lot 6 Click Score: 16    End of Session Equipment Utilized During Treatment: Oxygen;Gait belt(2 liters) Activity Tolerance: Patient limited by fatigue Patient left: with chair alarm set;with call bell/phone within reach;with family/visitor present Nurse Communication: Mobility status PT Visit Diagnosis: Unsteadiness on feet (R26.81);Muscle weakness (generalized) (M62.81)     Time: 4967-5916 PT Time Calculation (min) (ACUTE ONLY): 41 min  Charges:  $Gait Training: 8-22 mins $Therapeutic Exercise: 8-22 mins $Therapeutic Activity: 8-22 mins                    G Codes:       Kreg Shropshire, DPT 05/07/2017, 4:49 PM

## 2017-05-07 NOTE — Progress Notes (Addendum)
Patient ID: Lisa Haney, female   DOB: May 15, 1926, 84 y.o.   MRN: [id number]  [employer] PROGRESS NOTE  Lisa Haney ZTI:[id number] DOB: February 19, 1927 DOA: 05/04/2017 PCP: Glendon Axe, MD  HPI/Subjective: Patient still feeling very weak.  Still with cough and shortness of breath.  Objective: Vitals:   05/07/17 0835 05/07/17 0911  BP:  (!) 158/52  Pulse:  78  Resp:  18  Temp:  97.8 F (36.6 C)  SpO2: 95% 96%    Filed Weights   05/04/17 1247  Weight: 89.4 kg (197 lb)    ROS: Review of Systems  Constitutional: Negative for chills and fever.  Eyes: Negative for blurred vision.  Respiratory: Positive for cough, shortness of breath and wheezing.   Cardiovascular: Negative for chest pain.  Gastrointestinal: Negative for abdominal pain, constipation, diarrhea, nausea and vomiting.  Genitourinary: Negative for dysuria.  Musculoskeletal: Negative for joint pain.  Neurological: Negative for dizziness and headaches.   Exam: Physical Exam  Constitutional: She is oriented to person, place, and time.  HENT:  Nose: No mucosal edema.  Mouth/Throat: No oropharyngeal exudate or posterior oropharyngeal edema.  Eyes: Conjunctivae, EOM and lids are normal. Pupils are equal, round, and reactive to light.  Neck: No JVD present. Carotid bruit is not present. No edema present. No thyroid mass and no thyromegaly present.  Cardiovascular: S1 normal and S2 normal. Exam reveals no gallop.  No murmur heard. Pulses:      Dorsalis pedis pulses are 2+ on the right side, and 2+ on the left side.  Respiratory: No respiratory distress. She has decreased breath sounds in the right middle field, the right lower field, the left middle field and the left lower field. She has wheezes in the right middle field, the right lower field, the left middle field and the left lower field. She has no rhonchi. She has no rales.  GI: Soft. Bowel sounds are normal. There is no tenderness.  Musculoskeletal:        Right ankle: She exhibits swelling.       Left ankle: She exhibits swelling.  Lymphadenopathy:    She has no cervical adenopathy.  Neurological: She is alert and oriented to person, place, and time. No cranial nerve deficit.  Skin: Skin is warm. No rash noted. Nails show no clubbing.  Psychiatric: She has a normal mood and affect.      Data Reviewed: Basic Metabolic Panel: Recent Labs  Lab 05/04/17 1404 05/06/17 0308  NA 133* 133*  K 3.4* 3.8  CL 95* 97*  CO2 30 28  GLUCOSE 103* 166*  BUN 24* 16  CREATININE 0.94 0.83  CALCIUM 8.5* 8.4*   CBC: Recent Labs  Lab 05/04/17 1404 05/06/17 0308  WBC 12.2* 8.9  NEUTROABS 9.9*  --   HGB 14.7 13.7  HCT 44.4 40.3  MCV 87.4 86.4  PLT 144* 180   Cardiac Enzymes: Recent Labs  Lab 05/04/17 1404 05/06/17 0308  CKTOTAL  --  18*  TROPONINI <0.03  --      Recent Results (from the past 240 hour(s))  Culture, blood (routine x 2)     Status: None (Preliminary result)   Collection Time: 05/04/17  4:16 PM  Result Value Ref Range Status   Specimen Description BLOOD  Final   Special Requests NONE  Final   Culture   Final    NO GROWTH 3 DAYS Performed at [facility], [address] [place],  [address]    Report Status  PENDING  Incomplete  Culture, blood (routine x 2)     Status: None (Preliminary result)   Collection Time: 05/04/17  4:44 PM  Result Value Ref Range Status   Specimen Description BLOOD RIGHT ARM  Final   Special Requests   Final    BOTTLES DRAWN AEROBIC AND ANAEROBIC Blood Culture adequate volume   Culture   Final    NO GROWTH 3 DAYS Performed at [facility], [address], [place], [place] [address]    Report Status PENDING  Incomplete  Culture, expectorated sputum-assessment     Status: None   Collection Time: 05/05/17  1:13 PM  Result Value Ref Range Status   Specimen Description SPU  Final   Special Requests Normal  Final   Sputum evaluation   Final    Sputum specimen  not acceptable for testing.  Please recollect.   SPOKE TO ANNA [place] 05/05/17 @ [place] Performed at [facility], [address] [place], [place] [address]    Report Status 05/05/2017 FINAL  Final  MRSA PCR Screening     Status: None   Collection Time: 05/05/17  3:15 PM  Result Value Ref Range Status   MRSA by PCR NEGATIVE NEGATIVE Final    Comment:        The GeneXpert MRSA Assay (FDA approved for NASAL specimens only), is one component of a comprehensive MRSA colonization surveillance program. It is not intended to diagnose MRSA infection nor to guide or monitor treatment for MRSA infections. Performed at [facility], [address], [place], [place] [address]      Studies: No results found.  Scheduled Meds: . albuterol  3 mL Inhalation Q6H  . amLODipine  5 mg Oral Daily  . aspirin EC  81 mg Oral Daily  . budesonide (PULMICORT) nebulizer solution  0.5 mg Nebulization BID  . cholecalciferol  1,000 Units Oral Daily  . cloNIDine  0.2 mg Oral Daily  . enoxaparin (LOVENOX) injection  40 mg Subcutaneous Q24H  . isosorbide dinitrate  20 mg Oral TID  . levothyroxine  150 mcg Oral QAC breakfast  . lisinopril  20 mg Oral Daily  . methylPREDNISolone (SOLU-MEDROL) injection  40 mg Intravenous Q12H  . metoprolol tartrate  100 mg Oral BID  . nystatin  5 mL Oral QID  . oseltamivir  30 mg Oral BID  . senna  1 tablet Oral BID  . venlafaxine XR  75 mg Oral Q breakfast    Assessment/Plan:  1. Influenza A positive.  Started Tamiflu.  Pneumonia could be viral in nature.  Since pro-calcitonin is negative and patient's been on numerous antibiotics recently I will stop antibiotics. 2. COPD exacerbation.  Increase Solu-Medrol, continue nebulizer treatments with steroids 3. Weakness.  Physical therapy recommended rehab 4. Essential hypertension continue Norvasc, metoprolol and clonidine 5. Hyperlipidemia unspecified discontinue pravastatin for  now 6. Thrombocytopenia seems to be improving.  Likely from viral infection 7. Hypothyroidism unspecified on levothyroxine 8. Chronic respiratory failure on oxygen 9. Thrush.  Start nystatin swish and swallow  Code Status:     Code Status Orders  (From admission, onward)        Start     Ordered   05/04/17 1755  Full code  Continuous     05/04/17 1755    Code Status History    Date Active Date Inactive Code Status Order ID Comments User Context   06/21/2016 09:59 06/21/2016 14:13 Full Code [id number]  Isaias Cowman, MD  Inpatient   02/13/2016 04:17 02/15/2016 20:21 Full Code [id number]  Harvie Bridge, DO Inpatient    Advance Directive Documentation     Most Recent Value  Type of Advance Directive  Healthcare Power of Attorney  Pre-existing out of facility DNR order (yellow form or pink MOST form)  No data  "MOST" Form in Place?  No data     Family Communication: Son at the bedside Disposition Plan: Needs to be breathing better prior to disposition to rehab.  Antibiotics:  Tamiflu  Time spent: 25 minutes  [place]

## 2017-05-07 NOTE — Progress Notes (Signed)
Per MD patient is not stable for D/C today. Plan is for patient to D/C to [facility] for short term rehab when stable. [facility] admissions coordinator at [facility] is aware of above.   [employer], LCSW [phone]

## 2017-05-08 NOTE — Progress Notes (Signed)
Patient ID: Lisa Haney, female   DOB: [birth year], 84 y.o.   MRN: [id number]  [facility] PROGRESS NOTE  Lisa Haney TMH:[id number] DOB: 11-09-1926 DOA: 05/04/2017 PCP: Glendon Axe, MD  HPI/Subjective: Patient still feeling very weak.  Still with cough and shortness of breath.  She feels her breathing is a little bit better today.  Objective: Vitals:   05/08/17 0824 05/08/17 0833  BP: (!) 171/63   Pulse: 75   Resp: 17   Temp: 97.7 F (36.5 C)   SpO2: 91% 96%    Filed Weights   05/04/17 1247  Weight: 89.4 kg (197 lb)    ROS: Review of Systems  Constitutional: Negative for chills and fever.  Eyes: Negative for blurred vision.  Respiratory: Positive for cough, shortness of breath and wheezing.   Cardiovascular: Negative for chest pain.  Gastrointestinal: Negative for abdominal pain, constipation, diarrhea, nausea and vomiting.  Genitourinary: Negative for dysuria.  Musculoskeletal: Negative for joint pain.  Neurological: Negative for dizziness and headaches.   Exam: Physical Exam  Constitutional: She is oriented to person, place, and time.  HENT:  Nose: No mucosal edema.  Mouth/Throat: No oropharyngeal exudate or posterior oropharyngeal edema.  Eyes: Conjunctivae, EOM and lids are normal. Pupils are equal, round, and reactive to light.  Neck: No JVD present. Carotid bruit is not present. No edema present. No thyroid mass and no thyromegaly present.  Cardiovascular: S1 normal and S2 normal. Exam reveals no gallop.  No murmur heard. Pulses:      Dorsalis pedis pulses are 2+ on the right side, and 2+ on the left side.  Respiratory: No respiratory distress. She has decreased breath sounds in the right lower field and the left lower field. She has wheezes in the right lower field and the left lower field. She has no rhonchi. She has no rales.  GI: Soft. Bowel sounds are normal. There is no tenderness.  Musculoskeletal:       Right ankle: She exhibits swelling.        Left ankle: She exhibits swelling.  Lymphadenopathy:    She has no cervical adenopathy.  Neurological: She is alert and oriented to person, place, and time. No cranial nerve deficit.  Skin: Skin is warm. No rash noted. Nails show no clubbing.  Psychiatric: She has a normal mood and affect.      Data Reviewed: Basic Metabolic Panel: Recent Labs  Lab 05/04/17 1404 05/06/17 0308  NA 133* 133*  K 3.4* 3.8  CL 95* 97*  CO2 30 28  GLUCOSE 103* 166*  BUN 24* 16  CREATININE 0.94 0.83  CALCIUM 8.5* 8.4*   CBC: Recent Labs  Lab 05/04/17 1404 05/06/17 0308  WBC 12.2* 8.9  NEUTROABS 9.9*  --   HGB 14.7 13.7  HCT 44.4 40.3  MCV 87.4 86.4  PLT 144* 180   Cardiac Enzymes: Recent Labs  Lab 05/04/17 1404 05/06/17 0308  CKTOTAL  --  18*  TROPONINI <0.03  --      Recent Results (from the past 240 hour(s))  Culture, blood (routine x 2)     Status: None (Preliminary result)   Collection Time: 05/04/17  4:16 PM  Result Value Ref Range Status   Specimen Description BLOOD  Final   Special Requests NONE  Final   Culture   Final    NO GROWTH 4 DAYS Performed at [facility], [address], [place], [place] [address]    Report Status PENDING  Incomplete  Culture,  blood (routine x 2)     Status: None (Preliminary result)   Collection Time: 05/04/17  4:44 PM  Result Value Ref Range Status   Specimen Description BLOOD RIGHT ARM  Final   Special Requests   Final    BOTTLES DRAWN AEROBIC AND ANAEROBIC Blood Culture adequate volume   Culture   Final    NO GROWTH 4 DAYS Performed at [facility], [address], [place], [place] [address]    Report Status PENDING  Incomplete  Culture, expectorated sputum-assessment     Status: None   Collection Time: 05/05/17  1:13 PM  Result Value Ref Range Status   Specimen Description SPU  Final   Special Requests Normal  Final   Sputum evaluation   Final    Sputum specimen not acceptable for testing.  Please  recollect.   SPOKE TO ANNA [place] 05/05/17 @ [place] Performed at [facility], [address] [place], [place] [address]    Report Status 05/05/2017 FINAL  Final  MRSA PCR Screening     Status: None   Collection Time: 05/05/17  3:15 PM  Result Value Ref Range Status   MRSA by PCR NEGATIVE NEGATIVE Final    Comment:        The GeneXpert MRSA Assay (FDA approved for NASAL specimens only), is one component of a comprehensive MRSA colonization surveillance program. It is not intended to diagnose MRSA infection nor to guide or monitor treatment for MRSA infections. Performed at [facility], [address] [place], [place] [address]       Scheduled Meds: . albuterol  3 mL Inhalation Q6H  . amLODipine  5 mg Oral Daily  . aspirin EC  81 mg Oral Daily  . benzonatate  100 mg Oral TID  . budesonide (PULMICORT) nebulizer solution  0.5 mg Nebulization BID  . cholecalciferol  1,000 Units Oral Daily  . cloNIDine  0.2 mg Oral Daily  . enoxaparin (LOVENOX) injection  40 mg Subcutaneous Q24H  . isosorbide dinitrate  20 mg Oral TID  . levothyroxine  150 mcg Oral QAC breakfast  . lisinopril  20 mg Oral Daily  . methylPREDNISolone (SOLU-MEDROL) injection  40 mg Intravenous Q12H  . metoprolol tartrate  100 mg Oral BID  . nystatin  5 mL Oral QID  . oseltamivir  30 mg Oral BID  . senna  1 tablet Oral BID  . venlafaxine XR  75 mg Oral Q breakfast    Assessment/Plan:  1. Influenza A positive.  Continue Tamiflu.  Pneumonia could be viral in nature.  Since pro-calcitonin is negative and patient's been on numerous antibiotics recently I stopped antibiotics. 2. COPD exacerbation.  Continue Solu-Medrol, continue nebulizer treatments with steroids. 3. Weakness.  Physical therapy recommended rehab 4. Essential hypertension continue Norvasc, metoprolol and clonidine 5. Hyperlipidemia unspecified discontinue pravastatin for now 6. Thrombocytopenia seems to be improving.   Likely from viral infection 7. Hypothyroidism unspecified on levothyroxine 8. Chronic respiratory failure on oxygen 2 L 9. Thrush.  Started nystatin swish and swallow  Code Status:     Code Status Orders  (From admission, onward)        Start     Ordered   05/04/17 1755  Full code  Continuous     05/04/17 1755    Code Status History    Date Active Date Inactive Code Status Order ID Comments User Context   06/21/2016 09:59 06/21/2016 14:13 Full Code [id number]  Isaias Cowman, MD  Inpatient   02/13/2016 04:17 02/15/2016 20:21 Full Code [id number]  Harvie Bridge, DO Inpatient    Advance Directive Documentation     Most Recent Value  Type of Advance Directive  Healthcare Power of Attorney  Pre-existing out of facility DNR order (yellow form or pink MOST form)  No data  "MOST" Form in Place?  No data     Family Communication: Son at the bedside Disposition Plan: Potential disposition to rehab tomorrow  Antibiotics:  Tamiflu  Time spent: 24 minutes  [place]

## 2017-05-08 NOTE — Progress Notes (Signed)
Per MD patient is not stable for D/C to [facility] today. Clinical Education officer, museum (CSW) met with patient and her son Berton Mount was at bedside. Patient and son are in agreement with patient discharging to [facility]. [facility] admissions coordinator at [facility] is aware of above.   [employer], LCSW [phone]

## 2017-05-08 NOTE — Plan of Care (Signed)
  Progressing Clinical Measurements: Ability to maintain clinical measurements within normal limits will improve 05/08/2017 0902 - Progressing by Etheleen Nicks, RN Note Labs within normal limits today Respiratory complications will improve 05/08/2017 0902 - Progressing by Etheleen Nicks, RN Note Pt at baseline with 2LO2 per [place] Safety: Ability to remain free from injury will improve 05/08/2017 0902 - Progressing by Etheleen Nicks, RN Note Fall precautions in place, non skid socks when oob

## 2017-05-09 LAB — CULTURE, BLOOD (ROUTINE X 2)
CULTURE: NO GROWTH
Culture: NO GROWTH
Special Requests: ADEQUATE

## 2017-05-09 MED ORDER — GUAIFENESIN 100 MG/5ML PO SOLN: 5.0000 mL | ORAL | 0 refills | Status: DC | PRN

## 2017-05-09 MED ORDER — TRAMADOL-ACETAMINOPHEN 37.5-325 MG PO TABS: 1.0000 | ORAL_TABLET | Freq: Three times a day (TID) | ORAL | 0 refills | Status: DC | PRN

## 2017-05-09 MED ORDER — NYSTATIN 100000 UNIT/ML MT SUSP: 5.0000 mL | Freq: Four times a day (QID) | OROMUCOSAL | 0 refills | Status: AC

## 2017-05-09 MED ORDER — OSELTAMIVIR PHOSPHATE 30 MG PO CAPS: 30.0000 mg | ORAL_CAPSULE | Freq: Two times a day (BID) | ORAL | 0 refills | Status: AC

## 2017-05-09 MED ORDER — BENZONATATE 100 MG PO CAPS: 100.0000 mg | ORAL_CAPSULE | Freq: Three times a day (TID) | ORAL | 0 refills | Status: DC

## 2017-05-09 MED ORDER — PREDNISONE 20 MG PO TABS: 40.0000 mg | ORAL_TABLET | Freq: Every day | ORAL | 0 refills | Status: AC

## 2017-05-09 NOTE — Progress Notes (Signed)
Report called to [facility]. IV removed without complications. Patient transported via EMS to [facility].

## 2017-05-09 NOTE — Progress Notes (Signed)
Patient is medically stable for D/C to [facility] today. Per [facility] admissions coordinator at [facility] patient can come today to room 401. RN will call report and arrange EMS for transport. Clinical Education officer, museum (CSW) sent D/C orders to [facility] via [place]. Patient has her own c-pap machine in her room ar [place] that will go with her to [facility]. Patient is aware of above. CSW left patient's son Clint a voicemail making him aware of above. Please reconsult if future social work needs arise. CSW signing off.   [employer], LCSW [phone]

## 2017-05-09 NOTE — Discharge Summary (Signed)
[facility] [place] NAME: Lisa Haney    MR#:  [id number]  [place]:  01-04-1927  DATE OF ADMISSION:  05/04/2017 ADMITTING PHYSICIAN: Fritzi Mandes, MD  DATE OF DISCHARGE: 05/09/2017  PRIMARY CARE PHYSICIAN: Glendon Axe, MD    ADMISSION DIAGNOSIS:  Dehydration [E86.0] Hypokalemia [E87.6] Hyponatremia [E87.1] Generalized weakness [R53.1] Pneumonia of both lungs due to infectious organism, unspecified part of lung [J18.9]  DISCHARGE DIAGNOSIS:  Active Problems:   Pneumonia   SECONDARY DIAGNOSIS:   Past Medical History:  Diagnosis Date  . Arthritis   . Cancer ([place])    skin cancer on hand  . COPD (chronic obstructive pulmonary disease) ([place])   . Coronary artery disease    stent  . Diverticulitis   . Hypertension   . Stroke [facility])    TIA  . TIA (transient ischemic attack)     HOSPITAL COURSE:  84 year old female with a history of chronic hypoxic respiratory failure on 2 L oxygen at night and most recently 24 hours a day due to COPD and essential hypertension who presented with weakness and shortness of breath.  1. Acute on chronic hypoxic respiratory failure in the setting of influenza a. Patient will continue on 2 L oxygen.  2. Influenza A: Patient was started on Tamiflu. She has 2 more doses of Tamiflu. Stop date is tomorrow 10/08/2017.  3. Acute COPD exacerbation due to influenza: Patient will continue on prednisone. She will continue inhalers.  4. Essential hypertension: Continue Norvasc, Catapres, metoprolol and isosorbide  5. Hypothyroid: Continue Synthroid    DISCHARGE CONDITIONS AND DIET:   Stable for discharge on heart healthy diet  CONSULTS OBTAINED:    DRUG ALLERGIES:   Allergies  Allergen Reactions  . Doxycycline Other (See Comments)    Severe upset stomach and pain     DISCHARGE MEDICATIONS:   Allergies as of 05/09/2017      Reactions   Doxycycline Other (See Comments)   Severe upset  stomach and pain       Medication List    TAKE these medications   acetaminophen 650 MG CR tablet Commonly known as:  TYLENOL Take 650 mg by mouth every 8 (eight) hours as needed for pain.   albuterol 108 (90 Base) MCG/ACT inhaler Commonly known as:  PROVENTIL HFA;VENTOLIN HFA Inhale 2 puffs into the lungs every 6 (six) hours as needed for wheezing or shortness of breath.   amLODipine 5 MG tablet Commonly known as:  NORVASC Take 5 mg by mouth daily.   aspirin 81 MG tablet Take 81 mg by mouth daily.   benzonatate 100 MG capsule Commonly known as:  TESSALON Take 1 capsule (100 mg total) by mouth 3 (three) times daily.   Cholecalciferol 1000 UNIT/10ML Liqd Take 1,000 Units by mouth daily.   cloNIDine 0.2 MG tablet Commonly known as:  CATAPRES Take 0.2 mg by mouth daily.   cyclobenzaprine 5 MG tablet Commonly known as:  FLEXERIL Take 5 mg by mouth 2 (two) times daily as needed for muscle spasms.   furosemide 20 MG tablet Commonly known as:  LASIX Take 60 mg by mouth daily.   guaiFENesin 100 MG/5ML Soln Commonly known as:  ROBITUSSIN Take 5 mLs (100 mg total) by mouth every 4 (four) hours as needed for cough or to loosen phlegm.   isosorbide dinitrate 20 MG tablet Commonly known as:  ISORDIL Take 20 mg by mouth 3 (three) times daily.   levothyroxine 150 MCG tablet  Commonly known as:  SYNTHROID, LEVOTHROID Take 150 mcg by mouth daily before breakfast. Take on an empty stomach 30 to 60 minutes before breakfast   lisinopril 20 MG tablet Commonly known as:  PRINIVIL,ZESTRIL Take 20 mg by mouth daily.   metoprolol tartrate 100 MG tablet Commonly known as:  LOPRESSOR Take 100 mg by mouth 2 (two) times daily.   nystatin 100000 UNIT/ML suspension Commonly known as:  MYCOSTATIN Take 5 mLs (500,000 Units total) by mouth 4 (four) times daily for 4 days.   ondansetron 4 MG disintegrating tablet Commonly known as:  ZOFRAN ODT Take 1 tablet (4 mg total) by mouth every 8  (eight) hours as needed for nausea or vomiting.   oseltamivir 30 MG capsule Commonly known as:  TAMIFLU Take 1 capsule (30 mg total) by mouth 2 (two) times daily for 1 day.   pravastatin 40 MG tablet Commonly known as:  PRAVACHOL Take 40 mg by mouth at bedtime.   predniSONE 20 MG tablet Commonly known as:  DELTASONE Take 2 tablets (40 mg total) by mouth daily with breakfast for 4 days.   psyllium 58.6 % powder Commonly known as:  METAMUCIL Take 1 packet by mouth daily as needed.   traMADol-acetaminophen 37.5-325 MG tablet Commonly known as:  ULTRACET Take 1 tablet by mouth every 8 (eight) hours as needed for severe pain.   venlafaxine XR 75 MG 24 hr capsule Commonly known as:  EFFEXOR-XR Take 75 mg by mouth daily with breakfast.         Today   CHIEF COMPLAINT:   Patient feeling better this morning. Still has a cough.   VITAL SIGNS:  Blood pressure (!) 148/47, pulse 70, temperature 97.8 F (36.6 C), temperature source Oral, resp. rate 18, height 5\' 1"  (1.549 m), weight 89.4 kg (197 lb), SpO2 90 %.   REVIEW OF SYSTEMS:  Review of Systems  Constitutional: Negative.  Negative for chills, fever and malaise/fatigue.  HENT: Negative.  Negative for ear discharge, ear pain, hearing loss, nosebleeds and sore throat.   Eyes: Negative.  Negative for blurred vision and pain.  Respiratory: Positive for cough. Negative for hemoptysis, shortness of breath and wheezing.   Cardiovascular: Negative.  Negative for chest pain, palpitations and leg swelling.  Gastrointestinal: Negative.  Negative for abdominal pain, blood in stool, diarrhea, nausea and vomiting.  Genitourinary: Negative.  Negative for dysuria.  Musculoskeletal: Negative.  Negative for back pain.  Skin: Negative.   Neurological: Negative for dizziness, tremors, speech change, focal weakness, seizures and headaches.  Endo/Heme/Allergies: Negative.  Does not bruise/bleed easily.  Psychiatric/Behavioral: Negative.   Negative for depression, hallucinations and suicidal ideas.     PHYSICAL EXAMINATION:   GENERAL:  84 y.o.-year-old patient lying in the bed with no acute distress.  NECK:  Supple, no jugular venous distention. No thyroid enlargement, no tenderness.  LUNGS:some mild wheezing.  CARDIOV no rhonchiiASCULAR: S1, S2 normal. No murmurs, rubs, or gallops.  ABDOMEN: Soft, non-tender, non-distended. Bowel sounds present. No organomegaly or mass.  EXTREMITIES: No pedal edema, cyanosis, or clubbing.  PSYCHIATRIC: The patient is alert and oriented x 3.  SKIN: No obvious rash, lesion, or ulcer.   DATA REVIEW:   CBC Recent Labs  Lab 05/06/17 0308  WBC 8.9  HGB 13.7  HCT 40.3  PLT 180    Chemistries  Recent Labs  Lab 05/06/17 0308  NA 133*  K 3.8  CL 97*  CO2 28  GLUCOSE 166*  BUN 16  CREATININE 0.83  CALCIUM 8.4*    Cardiac Enzymes Recent Labs  Lab 05/04/17 1404  TROPONINI <0.03    Microbiology Results  @MICRORSLT48 @  RADIOLOGY:  No results found.    Allergies as of 05/09/2017      Reactions   Doxycycline Other (See Comments)   Severe upset stomach and pain       Medication List    TAKE these medications   acetaminophen 650 MG CR tablet Commonly known as:  TYLENOL Take 650 mg by mouth every 8 (eight) hours as needed for pain.   albuterol 108 (90 Base) MCG/ACT inhaler Commonly known as:  PROVENTIL HFA;VENTOLIN HFA Inhale 2 puffs into the lungs every 6 (six) hours as needed for wheezing or shortness of breath.   amLODipine 5 MG tablet Commonly known as:  NORVASC Take 5 mg by mouth daily.   aspirin 81 MG tablet Take 81 mg by mouth daily.   benzonatate 100 MG capsule Commonly known as:  TESSALON Take 1 capsule (100 mg total) by mouth 3 (three) times daily.   Cholecalciferol 1000 UNIT/10ML Liqd Take 1,000 Units by mouth daily.   cloNIDine 0.2 MG tablet Commonly known as:  CATAPRES Take 0.2 mg by mouth daily.   cyclobenzaprine 5 MG tablet Commonly  known as:  FLEXERIL Take 5 mg by mouth 2 (two) times daily as needed for muscle spasms.   furosemide 20 MG tablet Commonly known as:  LASIX Take 60 mg by mouth daily.   guaiFENesin 100 MG/5ML Soln Commonly known as:  ROBITUSSIN Take 5 mLs (100 mg total) by mouth every 4 (four) hours as needed for cough or to loosen phlegm.   isosorbide dinitrate 20 MG tablet Commonly known as:  ISORDIL Take 20 mg by mouth 3 (three) times daily.   levothyroxine 150 MCG tablet Commonly known as:  SYNTHROID, LEVOTHROID Take 150 mcg by mouth daily before breakfast. Take on an empty stomach 30 to 60 minutes before breakfast   lisinopril 20 MG tablet Commonly known as:  PRINIVIL,ZESTRIL Take 20 mg by mouth daily.   metoprolol tartrate 100 MG tablet Commonly known as:  LOPRESSOR Take 100 mg by mouth 2 (two) times daily.   nystatin 100000 UNIT/ML suspension Commonly known as:  MYCOSTATIN Take 5 mLs (500,000 Units total) by mouth 4 (four) times daily for 4 days.   ondansetron 4 MG disintegrating tablet Commonly known as:  ZOFRAN ODT Take 1 tablet (4 mg total) by mouth every 8 (eight) hours as needed for nausea or vomiting.   oseltamivir 30 MG capsule Commonly known as:  TAMIFLU Take 1 capsule (30 mg total) by mouth 2 (two) times daily for 1 day.   pravastatin 40 MG tablet Commonly known as:  PRAVACHOL Take 40 mg by mouth at bedtime.   predniSONE 20 MG tablet Commonly known as:  DELTASONE Take 2 tablets (40 mg total) by mouth daily with breakfast for 4 days.   psyllium 58.6 % powder Commonly known as:  METAMUCIL Take 1 packet by mouth daily as needed.   traMADol-acetaminophen 37.5-325 MG tablet Commonly known as:  ULTRACET Take 1 tablet by mouth every 8 (eight) hours as needed for severe pain.   venlafaxine XR 75 MG 24 hr capsule Commonly known as:  EFFEXOR-XR Take 75 mg by mouth daily with breakfast.           Management plans discussed with the patient and she is in  agreement. Stable for discharge to skilled nursing facility  Patient should follow up with PCP  CODE STATUS:     Code Status Orders  (From admission, onward)        Start     Ordered   05/04/17 1755  Full code  Continuous     05/04/17 1755    Code Status History    Date Active Date Inactive Code Status Order ID Comments User Context   06/21/2016 09:59 06/21/2016 14:13 Full Code [id number]  Isaias Cowman, MD Inpatient   02/13/2016 04:17 02/15/2016 20:21 Full Code [id number]  Harvie Bridge, DO Inpatient    Advance Directive Documentation     Most Recent Value  Type of Advance Directive  Healthcare Power of Attorney  Pre-existing out of facility DNR order (yellow form or pink MOST form)  No data  "MOST" Form in Place?  No data      TOTAL TIME TAKING CARE OF THIS PATIENT: 38 minutes.    Note: This dictation was prepared with Dragon dictation along with smaller phrase technology. Any transcriptional errors that result from this process are unintentional.  Roniyah Llorens M.D on 05/09/2017 at 9:03 AM  Between 7am to 6pm - Pager - [phone] After 6pm go to [email or web address] - password [email or web address] [facility]  Office  [phone]  CC: Primary care physician; Glendon Axe, MD

## 2017-05-09 NOTE — Clinical Social Work Placement (Signed)
   CLINICAL SOCIAL WORK PLACEMENT  NOTE  Date:  05/09/2017  Patient Details  Name: Lisa Haney MRN: [id number] Date of Birth: Jul 27, 1926  Clinical Social Work is seeking post-discharge placement for this patient at the [place] level of care (*CSW will initial, date and re-position this form in  chart as items are completed):  Yes   Patient/family provided with [facility] Work Department's list of facilities offering this level of care within the geographic area requested by the patient (or if unable, by the patient's family).  Yes   Patient/family informed of their freedom to choose among providers that offer the needed level of care, that participate in Medicare, Medicaid or managed care program needed by the patient, have an available bed and are willing to accept the patient.  Yes   Patient/family informed of [facility]'s ownership interest in [facility] and [facility], as well as of the fact that they are under no obligation to receive care at these facilities.  PASRR submitted to EDS on 05/06/17     PASRR number received on 05/06/17     Existing PASRR number confirmed on       FL2 transmitted to all facilities in geographic area requested by pt/family on 05/06/17     FL2 transmitted to all facilities within larger geographic area on       Patient informed that his/her managed care company has contracts with or will negotiate with certain facilities, including the following:        Yes   Patient/family informed of bed offers received.  Patient chooses bed at [facility] )     Physician recommends and patient chooses bed at      Patient to be transferred to [facility] ) on 05/09/17.  Patient to be transferred to facility by [facility] )     Patient family notified on 05/09/17 of transfer.  Name of family member notified:  (CSW left patient's son Clint a voicemail making him aware of D/C today. )      PHYSICIAN       Additional Comment:    _______________________________________________ Kydan Shanholtzer, Veronia Beets, LCSW 05/09/2017, 9:32 AM

## 2017-05-11 LAB — BLOOD GAS, VENOUS
PCO2 VEN: 55 mmHg (ref 44.0–60.0)
PH VEN: 7.42 (ref 7.250–7.430)
Patient temperature: 37

## 2017-05-14 ENCOUNTER — Emergency Department: Payer: Medicare Other

## 2017-05-14 ENCOUNTER — Encounter: Payer: Self-pay | Admitting: Emergency Medicine

## 2017-05-14 ENCOUNTER — Other Ambulatory Visit: Payer: Self-pay

## 2017-05-14 ENCOUNTER — Inpatient Hospital Stay
Admission: EM | Admit: 2017-05-14 | Discharge: 2017-05-17 | DRG: 689 | Disposition: A | Discharge status: Skilled Nursing Facility | Payer: Medicare Other | Attending: Internal Medicine | Admitting: Internal Medicine

## 2017-05-14 DIAGNOSIS — E039 Hypothyroidism, unspecified: Secondary | ICD-10-CM | POA: Diagnosis present

## 2017-05-14 DIAGNOSIS — Z79899 Other long term (current) drug therapy: Secondary | ICD-10-CM | POA: Diagnosis not present

## 2017-05-14 DIAGNOSIS — R0689 Other abnormalities of breathing: Secondary | ICD-10-CM | POA: Diagnosis present

## 2017-05-14 DIAGNOSIS — Z79890 Hormone replacement therapy (postmenopausal): Secondary | ICD-10-CM | POA: Diagnosis not present

## 2017-05-14 DIAGNOSIS — I959 Hypotension, unspecified: Secondary | ICD-10-CM | POA: Diagnosis present

## 2017-05-14 DIAGNOSIS — E860 Dehydration: Secondary | ICD-10-CM | POA: Diagnosis present

## 2017-05-14 DIAGNOSIS — J440 Chronic obstructive pulmonary disease with acute lower respiratory infection: Secondary | ICD-10-CM | POA: Diagnosis present

## 2017-05-14 DIAGNOSIS — Z90710 Acquired absence of both cervix and uterus: Secondary | ICD-10-CM

## 2017-05-14 DIAGNOSIS — J9612 Chronic respiratory failure with hypercapnia: Secondary | ICD-10-CM | POA: Diagnosis present

## 2017-05-14 DIAGNOSIS — Z96651 Presence of right artificial knee joint: Secondary | ICD-10-CM | POA: Diagnosis present

## 2017-05-14 DIAGNOSIS — N3001 Acute cystitis with hematuria: Secondary | ICD-10-CM | POA: Diagnosis present

## 2017-05-14 DIAGNOSIS — Z8673 Personal history of transient ischemic attack (TIA), and cerebral infarction without residual deficits: Secondary | ICD-10-CM | POA: Diagnosis not present

## 2017-05-14 DIAGNOSIS — I10 Essential (primary) hypertension: Secondary | ICD-10-CM | POA: Diagnosis present

## 2017-05-14 DIAGNOSIS — Z8701 Personal history of pneumonia (recurrent): Secondary | ICD-10-CM

## 2017-05-14 DIAGNOSIS — Z9049 Acquired absence of other specified parts of digestive tract: Secondary | ICD-10-CM

## 2017-05-14 DIAGNOSIS — Z955 Presence of coronary angioplasty implant and graft: Secondary | ICD-10-CM

## 2017-05-14 DIAGNOSIS — R4182 Altered mental status, unspecified: Secondary | ICD-10-CM

## 2017-05-14 DIAGNOSIS — E785 Hyperlipidemia, unspecified: Secondary | ICD-10-CM | POA: Diagnosis present

## 2017-05-14 DIAGNOSIS — M1990 Unspecified osteoarthritis, unspecified site: Secondary | ICD-10-CM | POA: Diagnosis present

## 2017-05-14 DIAGNOSIS — J189 Pneumonia, unspecified organism: Secondary | ICD-10-CM | POA: Diagnosis present

## 2017-05-14 DIAGNOSIS — I2510 Atherosclerotic heart disease of native coronary artery without angina pectoris: Secondary | ICD-10-CM | POA: Diagnosis present

## 2017-05-14 DIAGNOSIS — Z85828 Personal history of other malignant neoplasm of skin: Secondary | ICD-10-CM

## 2017-05-14 DIAGNOSIS — H9190 Unspecified hearing loss, unspecified ear: Secondary | ICD-10-CM | POA: Diagnosis present

## 2017-05-14 DIAGNOSIS — Z23 Encounter for immunization: Secondary | ICD-10-CM | POA: Diagnosis present

## 2017-05-14 DIAGNOSIS — E871 Hypo-osmolality and hyponatremia: Secondary | ICD-10-CM | POA: Diagnosis present

## 2017-05-14 DIAGNOSIS — Z881 Allergy status to other antibiotic agents status: Secondary | ICD-10-CM

## 2017-05-14 DIAGNOSIS — Z9981 Dependence on supplemental oxygen: Secondary | ICD-10-CM

## 2017-05-14 DIAGNOSIS — B9689 Other specified bacterial agents as the cause of diseases classified elsewhere: Secondary | ICD-10-CM | POA: Diagnosis present

## 2017-05-14 LAB — URINALYSIS, COMPLETE (UACMP) WITH MICROSCOPIC
Bilirubin Urine: NEGATIVE
Glucose, UA: NEGATIVE mg/dL
KETONES UR: NEGATIVE mg/dL
Nitrite: POSITIVE — AB
PROTEIN: NEGATIVE mg/dL
Specific Gravity, Urine: 1.012 (ref 1.005–1.030)
pH: 5 (ref 5.0–8.0)

## 2017-05-14 LAB — COMPREHENSIVE METABOLIC PANEL
ALT: 49 U/L (ref 14–54)
AST: 32 U/L (ref 15–41)
Albumin: 2.9 g/dL — ABNORMAL LOW (ref 3.5–5.0)
Alkaline Phosphatase: 60 U/L (ref 38–126)
Anion gap: 9 (ref 5–15)
BUN: 43 mg/dL — AB (ref 6–20)
CHLORIDE: 99 mmol/L — AB (ref 101–111)
CO2: 26 mmol/L (ref 22–32)
CREATININE: 1.26 mg/dL — AB (ref 0.44–1.00)
Calcium: 8.4 mg/dL — ABNORMAL LOW (ref 8.9–10.3)
GFR calc Af Amer: 43 mL/min — ABNORMAL LOW (ref >60)
GFR, EST NON AFRICAN AMERICAN: 37 mL/min — AB (ref >60)
Glucose, Bld: 98 mg/dL (ref 65–99)
Potassium: 4.4 mmol/L (ref 3.5–5.1)
SODIUM: 134 mmol/L — AB (ref 135–145)
Total Bilirubin: 0.8 mg/dL (ref 0.3–1.2)
Total Protein: 5.9 g/dL — ABNORMAL LOW (ref 6.5–8.1)

## 2017-05-14 LAB — CBC
HCT: 43.4 % (ref 35.0–47.0)
HEMOGLOBIN: 14.1 g/dL (ref 12.0–16.0)
MCH: 28.5 pg (ref 26.0–34.0)
MCHC: 32.5 g/dL (ref 32.0–36.0)
MCV: 87.9 fL (ref 80.0–100.0)
Platelets: 218 10*3/uL (ref 150–440)
RBC: 4.94 MIL/uL (ref 3.80–5.20)
RDW: 15.1 % — ABNORMAL HIGH (ref 11.5–14.5)
WBC: 13.2 10*3/uL — AB (ref 3.6–11.0)

## 2017-05-14 LAB — CREATININE, SERUM
Creatinine, Ser: 1.28 mg/dL — ABNORMAL HIGH (ref 0.44–1.00)
GFR calc Af Amer: 42 mL/min — ABNORMAL LOW (ref >60)
GFR calc non Af Amer: 36 mL/min — ABNORMAL LOW (ref >60)

## 2017-05-14 LAB — BRAIN NATRIURETIC PEPTIDE: B NATRIURETIC PEPTIDE 5: 399 pg/mL — AB (ref 0.0–100.0)

## 2017-05-14 LAB — TROPONIN I: Troponin I: <0.03 ng/mL (ref <0.03)

## 2017-05-14 LAB — GLUCOSE, CAPILLARY: Glucose-Capillary: 102 mg/dL — ABNORMAL HIGH (ref 65–99)

## 2017-05-14 MED ORDER — SENNOSIDES-DOCUSATE SODIUM 8.6-50 MG PO TABS: 1.0000 | ORAL_TABLET | Freq: Every evening | ORAL | Status: DC | PRN

## 2017-05-14 MED ORDER — BENZONATATE 100 MG PO CAPS
100.0000 mg | ORAL_CAPSULE | Freq: Three times a day (TID) | ORAL | Status: DC
  Administered 2017-05-14 – 2017-05-17 (×7): 100 mg via ORAL
  Filled 2017-05-14 (×8): qty 1

## 2017-05-14 MED ORDER — LISINOPRIL 20 MG PO TABS
20.0000 mg | ORAL_TABLET | Freq: Every day | ORAL | Status: DC
  Administered 2017-05-15 – 2017-05-17 (×3): 20 mg via ORAL
  Filled 2017-05-14 (×3): qty 1

## 2017-05-14 MED ORDER — ASPIRIN EC 81 MG PO TBEC
81.0000 mg | DELAYED_RELEASE_TABLET | Freq: Every day | ORAL | Status: DC
  Administered 2017-05-15 – 2017-05-17 (×3): 81 mg via ORAL
  Filled 2017-05-14 (×3): qty 1

## 2017-05-14 MED ORDER — ACETAMINOPHEN 650 MG RE SUPP: 650.0000 mg | Freq: Four times a day (QID) | RECTAL | Status: DC | PRN

## 2017-05-14 MED ORDER — TRAMADOL-ACETAMINOPHEN 37.5-325 MG PO TABS
1.0000 | ORAL_TABLET | Freq: Three times a day (TID) | ORAL | Status: DC | PRN
  Administered 2017-05-14: 22:00:00 1 via ORAL
  Filled 2017-05-14: qty 1

## 2017-05-14 MED ORDER — ISOSORBIDE DINITRATE 20 MG PO TABS
20.0000 mg | ORAL_TABLET | Freq: Three times a day (TID) | ORAL | Status: DC
  Filled 2017-05-14: qty 1

## 2017-05-14 MED ORDER — ONDANSETRON HCL 4 MG PO TABS: 4.0000 mg | ORAL_TABLET | Freq: Four times a day (QID) | ORAL | Status: DC | PRN

## 2017-05-14 MED ORDER — HYDROCODONE-ACETAMINOPHEN 5-325 MG PO TABS: 1.0000 | ORAL_TABLET | ORAL | Status: DC | PRN

## 2017-05-14 MED ORDER — HEPARIN SODIUM (PORCINE) 5000 UNIT/ML IJ SOLN
5000.0000 [IU] | Freq: Three times a day (TID) | INTRAMUSCULAR | Status: DC
  Administered 2017-05-14 – 2017-05-17 (×7): 5000 [IU] via SUBCUTANEOUS
  Filled 2017-05-14 (×7): qty 1

## 2017-05-14 MED ORDER — ONDANSETRON HCL 4 MG/2ML IJ SOLN: 4.0000 mg | Freq: Four times a day (QID) | INTRAMUSCULAR | Status: DC | PRN

## 2017-05-14 MED ORDER — BISACODYL 5 MG PO TBEC: 5.0000 mg | DELAYED_RELEASE_TABLET | Freq: Every day | ORAL | Status: DC | PRN

## 2017-05-14 MED ORDER — PNEUMOCOCCAL VAC POLYVALENT 25 MCG/0.5ML IJ INJ
0.5000 mL | INJECTION | INTRAMUSCULAR | Status: AC
  Administered 2017-05-15: 10:00:00 0.5 mL via INTRAMUSCULAR
  Filled 2017-05-14: qty 0.5

## 2017-05-14 MED ORDER — GUAIFENESIN 100 MG/5ML PO SOLN
5.0000 mL | ORAL | Status: DC | PRN
  Filled 2017-05-14: qty 5

## 2017-05-14 MED ORDER — DEXTROSE 5 % IV SOLN
2.0000 g | Freq: Once | INTRAVENOUS | Status: DC
  Filled 2017-05-14: qty 2

## 2017-05-14 MED ORDER — VANCOMYCIN HCL IN DEXTROSE 1-5 GM/200ML-% IV SOLN
1000.0000 mg | INTRAVENOUS | Status: AC
  Administered 2017-05-14: 1000 mg via INTRAVENOUS
  Filled 2017-05-14: qty 200

## 2017-05-14 MED ORDER — NYSTATIN 100000 UNIT/ML MT SUSP
5.0000 mL | Freq: Four times a day (QID) | OROMUCOSAL | Status: DC
  Administered 2017-05-14 – 2017-05-17 (×8): 500000 [IU] via ORAL
  Filled 2017-05-14 (×8): qty 5

## 2017-05-14 MED ORDER — PRAVASTATIN SODIUM 20 MG PO TABS
40.0000 mg | ORAL_TABLET | Freq: Every day | ORAL | Status: DC
  Administered 2017-05-14 – 2017-05-16 (×3): 40 mg via ORAL
  Filled 2017-05-14 (×4): qty 2

## 2017-05-14 MED ORDER — AMLODIPINE BESYLATE 5 MG PO TABS
5.0000 mg | ORAL_TABLET | Freq: Every day | ORAL | Status: DC
  Administered 2017-05-15 – 2017-05-17 (×3): 5 mg via ORAL
  Filled 2017-05-14 (×3): qty 1

## 2017-05-14 MED ORDER — DEXTROSE 5 % IV SOLN
2.0000 g | Freq: Two times a day (BID) | INTRAVENOUS | Status: DC
  Administered 2017-05-14 – 2017-05-16 (×5): 2 g via INTRAVENOUS
  Filled 2017-05-14 (×7): qty 2

## 2017-05-14 MED ORDER — LEVOTHYROXINE SODIUM 150 MCG PO TABS
150.0000 ug | ORAL_TABLET | Freq: Every day | ORAL | Status: DC
  Administered 2017-05-15 – 2017-05-17 (×3): 150 ug via ORAL
  Filled 2017-05-14: qty 1
  Filled 2017-05-14: qty 3
  Filled 2017-05-14 (×2): qty 1
  Filled 2017-05-14 (×2): qty 3

## 2017-05-14 MED ORDER — ISOSORBIDE DINITRATE 20 MG PO TABS
20.0000 mg | ORAL_TABLET | Freq: Three times a day (TID) | ORAL | Status: DC
  Administered 2017-05-15 – 2017-05-17 (×5): 20 mg via ORAL
  Filled 2017-05-14 (×10): qty 1

## 2017-05-14 MED ORDER — LEVOFLOXACIN IN D5W 750 MG/150ML IV SOLN
750.0000 mg | Freq: Once | INTRAVENOUS | Status: AC
  Administered 2017-05-14: 750 mg via INTRAVENOUS
  Filled 2017-05-14: qty 150

## 2017-05-14 MED ORDER — CEFEPIME HCL 2 G IJ SOLR
2.0000 g | Freq: Two times a day (BID) | INTRAMUSCULAR | Status: DC
  Filled 2017-05-14: qty 2

## 2017-05-14 MED ORDER — METOPROLOL TARTRATE 50 MG PO TABS
100.0000 mg | ORAL_TABLET | Freq: Two times a day (BID) | ORAL | Status: DC
  Filled 2017-05-14: qty 2

## 2017-05-14 MED ORDER — CLONIDINE HCL 0.1 MG PO TABS
0.2000 mg | ORAL_TABLET | Freq: Every day | ORAL | Status: DC
  Administered 2017-05-15 – 2017-05-16 (×2): 0.2 mg via ORAL
  Filled 2017-05-14 (×2): qty 2

## 2017-05-14 MED ORDER — DEXTROSE 5 % IV SOLN: 1.0000 g | Freq: Three times a day (TID) | INTRAVENOUS | Status: DC

## 2017-05-14 MED ORDER — METOPROLOL TARTRATE 50 MG PO TABS
100.0000 mg | ORAL_TABLET | Freq: Two times a day (BID) | ORAL | Status: DC
  Administered 2017-05-15 – 2017-05-16 (×3): 100 mg via ORAL
  Filled 2017-05-14 (×3): qty 2

## 2017-05-14 MED ORDER — VENLAFAXINE HCL 75 MG PO TABS
75.0000 mg | ORAL_TABLET | Freq: Every day | ORAL | Status: DC
  Administered 2017-05-15 – 2017-05-17 (×3): 75 mg via ORAL
  Filled 2017-05-14 (×3): qty 1

## 2017-05-14 MED ORDER — SODIUM CHLORIDE 0.9 % IV SOLN
INTRAVENOUS | Status: DC
  Administered 2017-05-14 – 2017-05-15 (×2): via INTRAVENOUS

## 2017-05-14 MED ORDER — CYCLOBENZAPRINE HCL 10 MG PO TABS: 5.0000 mg | ORAL_TABLET | Freq: Two times a day (BID) | ORAL | Status: DC | PRN

## 2017-05-14 MED ORDER — ACETAMINOPHEN 325 MG PO TABS
650.0000 mg | ORAL_TABLET | Freq: Four times a day (QID) | ORAL | Status: DC | PRN
  Administered 2017-05-17: 650 mg via ORAL
  Filled 2017-05-14: qty 2

## 2017-05-14 MED ORDER — ALBUTEROL SULFATE (2.5 MG/3ML) 0.083% IN NEBU: 2.5000 mg | INHALATION_SOLUTION | RESPIRATORY_TRACT | Status: DC | PRN

## 2017-05-14 NOTE — ED Notes (Signed)
Family at bedside. 

## 2017-05-14 NOTE — H&P (Signed)
[facility] NAME: Lisa Haney    MR#:  [id number]  DATE OF BIRTH:  [birth year]  DATE OF ADMISSION:  05/14/2017  PRIMARY CARE PHYSICIAN: Glendon Axe, MD   REQUESTING/REFERRING PHYSICIAN: Eula Listen, MD  CHIEF COMPLAINT:   Chief Complaint  Patient presents with  . Weakness   Generalized weakness and somnolence HISTORY OF PRESENT ILLNESS:  Lisa Haney  is a 84 y.o. female with a known history of recent pneumonia, COPD, CAD, hypertension and CVA.  The [facility] due to above chief complaints.  The patient complains of generalized weakness, dysuria, cough and chills, but denies any fever, nausea, vomiting or diarrhea.  She was just discharged from the hospital after treatment of pneumonia.  Chest x-ray showed bilateral interstitial inflammation, urinalysis showed UTI.  PAST MEDICAL HISTORY:   Past Medical History:  Diagnosis Date  . Arthritis   . Cancer ([place])    skin cancer on hand  . COPD (chronic obstructive pulmonary disease) ([place])   . Coronary artery disease    stent  . Diverticulitis   . Hypertension   . Stroke [facility])    TIA  . TIA (transient ischemic attack)     PAST SURGICAL HISTORY:   Past Surgical History:  Procedure Laterality Date  . ABDOMINAL HYSTERECTOMY    . CAROTID STENT    . CHOLECYSTECTOMY    . DILATION AND CURETTAGE OF UTERUS    . NASAL SINUS SURGERY    . OVARIAN CYST REMOVAL    . REPLACEMENT TOTAL KNEE Right   . TONSILLECTOMY      SOCIAL HISTORY:   Social History   Tobacco Use  . Smoking status: Never Smoker  . Smokeless tobacco: Never Used  Substance Use Topics  . Alcohol use: No    FAMILY HISTORY:  No family history on file.  DRUG ALLERGIES:   Allergies  Allergen Reactions  . Doxycycline Other (See Comments)    Severe upset stomach and pain     REVIEW OF SYSTEMS:   Review of Systems  Constitutional: Positive for  malaise/fatigue. Negative for chills and fever.  HENT: Positive for hearing loss. Negative for sore throat.   Eyes: Negative for blurred vision and double vision.  Respiratory: Positive for cough. Negative for hemoptysis, sputum production, shortness of breath, wheezing and stridor.   Cardiovascular: Negative for chest pain, palpitations, orthopnea and leg swelling.  Gastrointestinal: Negative for abdominal pain, blood in stool, diarrhea, melena, nausea and vomiting.  Genitourinary: Positive for dysuria. Negative for flank pain and hematuria.       Incontinence  Musculoskeletal: Negative for back pain and joint pain.  Skin: Negative for rash.  Neurological: Positive for weakness. Negative for dizziness, sensory change, focal weakness, seizures, loss of consciousness and headaches.  Endo/Heme/Allergies: Negative for polydipsia.  Psychiatric/Behavioral: Negative for depression. The patient is not nervous/anxious.     MEDICATIONS AT HOME:   Prior to Admission medications   Medication Sig Start Date End Date Taking? Authorizing Provider  acetaminophen (TYLENOL) 650 MG CR tablet Take 650 mg by mouth every 8 (eight) hours as needed for pain.   Yes [provider]  albuterol (PROVENTIL HFA;VENTOLIN HFA) 108 (90 Base) MCG/ACT inhaler Inhale 2 puffs into the lungs every 6 (six) hours as needed for wheezing or shortness of breath. 04/28/17  Yes Alfred Levins, [place], MD  amLODipine (NORVASC) 5 MG tablet Take 5 mg by mouth daily.  Yes [provider]  aspirin 81 MG tablet Take 81 mg by mouth daily.   Yes [provider]  benzonatate (TESSALON) 100 MG capsule Take 1 capsule (100 mg total) by mouth 3 (three) times daily. 05/09/17  Yes Bettey Costa, MD  cholecalciferol (VITAMIN D) 1000 units tablet Take 1,000 Units by mouth daily.   Yes [provider]  cloNIDine (CATAPRES) 0.2 MG tablet Take 0.2 mg by mouth daily.   Yes [provider]  cyclobenzaprine (FLEXERIL)  5 MG tablet Take 5 mg by mouth every 12 (twelve) hours as needed for muscle spasms.    Yes [provider]  furosemide (LASIX) 20 MG tablet Take 60 mg by mouth daily.   Yes [provider]  guaiFENesin (ROBITUSSIN) 100 MG/5ML SOLN Take 5 mLs (100 mg total) by mouth every 4 (four) hours as needed for cough or to loosen phlegm. 05/09/17  Yes Mody, Ulice Bold, MD  isosorbide dinitrate (ISORDIL) 20 MG tablet Take 20 mg by mouth 3 (three) times daily.   Yes [provider]  levothyroxine (SYNTHROID, LEVOTHROID) 150 MCG tablet Take 150 mcg by mouth daily.    Yes [provider]  lisinopril (PRINIVIL,ZESTRIL) 20 MG tablet Take 20 mg by mouth daily.   Yes [provider]  metoprolol tartrate (LOPRESSOR) 100 MG tablet Take 100 mg by mouth 2 (two) times daily.   Yes [provider]  nystatin (MYCOSTATIN) 100000 UNIT/ML suspension Take 5 mLs by mouth 4 (four) times daily.   Yes [provider]  ondansetron (ZOFRAN-ODT) 4 MG disintegrating tablet Take 4 mg by mouth every 8 (eight) hours as needed for nausea.   Yes [provider]  pravastatin (PRAVACHOL) 40 MG tablet Take 40 mg by mouth at bedtime.   Yes [provider]  Psyllium 58.12 % PACK Take 1 packet by mouth daily as needed. For laxative   Yes [provider]  traMADol-acetaminophen (ULTRACET) 37.5-325 MG tablet Take 1 tablet by mouth every 8 (eight) hours as needed for severe pain. 05/09/17  Yes Mody, Ulice Bold, MD  venlafaxine (EFFEXOR) 75 MG tablet Take 75 mg by mouth daily with breakfast.   Yes [provider]      VITAL SIGNS:  Blood pressure (!) 106/55, pulse 60, temperature (!) 97 F (36.1 C), temperature source Axillary, resp. rate 15, SpO2 93 %.  PHYSICAL EXAMINATION:  Physical Exam  GENERAL:  84 y.o.-year-old patient lying in the bed with no acute distress.  EYES: Pupils equal, round, reactive to light and accommodation. No scleral icterus.  Extraocular muscles intact.  HEENT: Head atraumatic, normocephalic. Oropharynx and nasopharynx clear.  NECK:  Supple, no jugular venous distention. No thyroid enlargement, no tenderness.  LUNGS: Normal breath sounds bilaterally, no wheezing, mild basilar crackles. No use of accessory muscles of respiration.  CARDIOVASCULAR: S1, S2 normal. No murmurs, rubs, or gallops.  ABDOMEN: Soft, nontender, nondistended. Bowel sounds present. No organomegaly or mass.  EXTREMITIES: No pedal edema, cyanosis, or clubbing.  NEUROLOGIC: Cranial nerves II through XII are intact. Muscle strength 4/5 in all extremities. Sensation intact. Gait not checked.  PSYCHIATRIC: The patient is alert and oriented x 3.  SKIN: No obvious rash, lesion, or ulcer.   LABORATORY PANEL:   CBC Recent Labs  Lab 05/14/17 1345  WBC 13.2*  HGB 14.1  HCT 43.4  PLT 218   ------------------------------------------------------------------------------------------------------------------  Chemistries  Recent Labs  Lab 05/14/17 1345  NA 134*  K 4.4  CL 99*  CO2 26  GLUCOSE  98  BUN 43*  CREATININE 1.26*  CALCIUM 8.4*  AST 32  ALT 49  ALKPHOS 60  BILITOT 0.8   ------------------------------------------------------------------------------------------------------------------  Cardiac Enzymes Recent Labs  Lab 05/14/17 1345  TROPONINI <0.03   ------------------------------------------------------------------------------------------------------------------  RADIOLOGY:  Dg Chest 2 View  Result Date: 05/14/2017 CLINICAL DATA:  Cough EXAM: CHEST  2 VIEW COMPARISON:  05/04/2017 FINDINGS: There is mild diffuse bilateral interstitial prominence. There is no focal parenchymal opacity. There is no pleural effusion or pneumothorax. There is stable cardiomegaly. There is thoracic aortic atherosclerosis. The osseous structures are unremarkable. IMPRESSION: Mild bilateral diffuse interstitial prominence which may reflect mild  interstitial edema versus interstitial infection. Electronically Signed   By: Kathreen Devoid   On: 05/14/2017 14:28      IMPRESSION AND PLAN:   Pneumonia, HAP, UTI with leukocytosis The patient will be admitted to medical floor. Start cefepime and vancomycin pharmacy to dose, follow-up CBC and cultures.  Dehydration and hyponatremia, hold Lasix and give gentle IV fluid support, follow-up BMP.  Hypertension, continue home hypertension medication but hold Lasix. Generalized weakness, PT evaluation.  All the records are reviewed and case discussed with ED provider. Management plans discussed with the patient, her 2 sons and they are in agreement.  CODE STATUS: Full code  TOTAL TIME TAKING CARE OF THIS PATIENT: 56 minutes.    Demetrios Loll M.D on 05/14/2017 at 4:32 PM  Between 7am to 6pm - Pager - [phone]  After 6pm go to [email or web address] - Proofreader  [facility]  Office  [phone]  CC: Primary care physician; Glendon Axe, MD   Note: This dictation was prepared with Dragon dictation along with smaller phrase technology. Any transcriptional errors that result from this process are unin

## 2017-05-14 NOTE — Progress Notes (Signed)
Pharmacy Antibiotic Note  Lisa Haney is a 84 y.o. female admitted on 05/14/2017 with pneumonia.  Pharmacy has been consulted for Vancomycin and Cefepime dosing.  Patient at [facility] 1/5/-05/09/17 for influenza/PNA and d/c to [place]: Patient to receive Vancomycin 1 gram IN ER x1 and Cefepime 2 gram IV x 1 in ER. Will continue with Vancomycin stacked dosing. Vancomycin 1000 IV every 36 hours.  Goal trough 15-20 mcg/mL.  Ke 0.024  T1/2 28.8  Vd 45  AdjBW 64.4 kg Will check Vancomycin trough prior to 4th dose  (5th day) on 1/19.  -Cefepime 2 gram IV q12h for Crcl 31.4 ml/min     Temp (24hrs), Avg:97 F (36.1 C), Min:97 F (36.1 C), Max:97 F (36.1 C)  Recent Labs  Lab 05/14/17 1345  WBC 13.2*  CREATININE 1.26*    Estimated Creatinine Clearance: 31.4 mL/min (A) (by C-G formula based on SCr of 1.26 mg/dL (H)).    Allergies  Allergen Reactions  . Doxycycline Other (See Comments)    Severe upset stomach and pain     Antimicrobials this admission: Levaquin 750mg  X1 in ER    >>   Vanc 1/15   >>   Cefepime 1/15 >>  Dose adjustments this admission:    Microbiology results: 1/15 BCx: pending   UCx:   1/15 UA  +   Sputum:    1/15 MRSA PCR: pending  Thank you for allowing pharmacy to be a part of this patient's care.  Tayten Heber A 05/14/2017 4:46 PM

## 2017-05-14 NOTE — ED Provider Notes (Addendum)
[facility] Emergency Department Provider Note  ____________________________________________  Time seen: Approximately 1:43 PM  I have reviewed the triage vital signs and the nursing notes.   HISTORY  Chief Complaint Weakness    HPI Lisa Haney is a 84 y.o. female discharged to [place] 1/10 after admission for influenza with pneumonia and COPD exacerbation presenting for generalized weakness.  The patient herself states that she has been feeling poorly since her discharge and does not notice any acute changes.  The patient was sent here from her facility because she seemed more weak while undergoing physical therapy today.  The patient denies any new or different cough, change in her oxygen requirement, urinary symptoms, nausea vomiting or diarrhea.  No chest pain or shortness of breath above her chronic shortness of breath.  Past Medical History:  Diagnosis Date  . Arthritis   . Cancer ([place])    skin cancer on hand  . COPD (chronic obstructive pulmonary disease) ([place])   . Coronary artery disease    stent  . Diverticulitis   . Hypertension   . Stroke [facility])    TIA  . TIA (transient ischemic attack)     Patient Active Problem List   Diagnosis Date Noted  . Pneumonia 05/04/2017  . COPD exacerbation ([place]) 02/13/2016    Past Surgical History:  Procedure Laterality Date  . ABDOMINAL HYSTERECTOMY    . CAROTID STENT    . CHOLECYSTECTOMY    . DILATION AND CURETTAGE OF UTERUS    . NASAL SINUS SURGERY    . OVARIAN CYST REMOVAL    . REPLACEMENT TOTAL KNEE Right   . TONSILLECTOMY      Current Outpatient Rx  . Order #: [id number] Class: Historical Med  . Order #: [id number] Class: Print  . Order #: [id number] Class: Historical Med  . Order #: [id number] Class: Historical Med  . Order #: [id number] Class: Normal  . Order #: [id number] Class: Historical Med  . Order #: [id number] Class: Historical Med  . Order #: [id number] Class: Historical Med  . Order #:  [id number] Class: Historical Med  . Order #: [id number] Class: Normal  . Order #: [id number] Class: Historical Med  . Order #: [id number] Class: Historical Med  . Order #: [id number] Class: Historical Med  . Order #: [id number] Class: Historical Med  . Order #: [id number] Class: Historical Med  . Order #: [id number] Class: Historical Med  . Order #: [id number] Class: Historical Med  . Order #: [id number] Class: Historical Med  . Order #: [id number] Class: Print  . Order #: [id number] Class: Historical Med    Allergies Doxycycline  No family history on file.  Social History Social History   Tobacco Use  . Smoking status: Never Smoker  . Smokeless tobacco: Never Used  Substance Use Topics  . Alcohol use: No  . Drug use: No    Review of Systems Constitutional: No fever/chills.  No lightheadedness or syncope.  Positive generalized weakness. Eyes: No visual changes. ENT: No sore throat. No congestion or rhinorrhea. Cardiovascular: Denies chest pain. Denies palpitations. Respiratory: Chronic unchanged shortness of breath.  No cough. Gastrointestinal: No abdominal pain.  No nausea, no vomiting.  No diarrhea.  No constipation. Genitourinary: Negative for dysuria. Musculoskeletal: Negative for back pain. Skin: Negative for rash. Neurological: Negative for headaches. No focal numbness, tingling or weakness.     ____________________________________________   PHYSICAL EXAM:  VITAL SIGNS: ED Triage Vitals  Enc Vitals Group     BP      Pulse      Resp      Temp  Temp src      SpO2      Weight      Height      Head Circumference      Peak Flow      Pain Score      Pain Loc      Pain Edu?      Excl. in [place]?     Constitutional: The patient is alert and answering questions appropriately.  She is chronically ill-appearing and mildly slow to move around, but nontoxic. Eyes: Conjunctivae are normal.  EOMI. No scleral icterus.  No eye discharge. Head: Atraumatic. Nose: No  congestion/rhinnorhea. Mouth/Throat: Mucous membranes are mildly dry.  Neck: No stridor.  Supple.  No JVD.  No meningismus. Cardiovascular: Normal rate, regular rhythm. No murmurs, rubs or gallops.  Respiratory: Normal respiratory effort.  No accessory muscle use or retractions. Lungs CTAB.  No wheezes, rales or ronchi. Gastrointestinal: Soft, nontender and nondistended.  No guarding or rebound.  No peritoneal signs. Musculoskeletal: Symmetric nonpitting lower extremity edema in the lower externally. No ttp in the calves or palpable cords.  Negative Homan's sign. Neurologic:  A&Ox3.  Speech is clear.  Face and smile are symmetric.  EOMI.  Moves all extremities well. Skin:  Skin is warm, dry and intact. No rash noted. Psychiatric: Mood and affect are normal. Speech and behavior are normal.  Normal judgement.  ____________________________________________   LABS (all labs ordered are listed, but only abnormal results are displayed)  Labs Reviewed  BLOOD GAS, VENOUS - Abnormal; Notable for the following components:      Result Value   pCO2, Ven 61 (*)    pO2, Ven <31.0 (*)    Bicarbonate 29.3 (*)    All other components within normal limits  CBC - Abnormal; Notable for the following components:   WBC 13.2 (*)    RDW 15.1 (*)    All other components within normal limits  COMPREHENSIVE METABOLIC PANEL - Abnormal; Notable for the following components:   Sodium 134 (*)    Chloride 99 (*)    BUN 43 (*)    Creatinine, Ser 1.26 (*)    Calcium 8.4 (*)    Total Protein 5.9 (*)    Albumin 2.9 (*)    GFR calc non Af Amer 37 (*)    GFR calc Af Amer 43 (*)    All other components within normal limits  GLUCOSE, CAPILLARY - Abnormal; Notable for the following components:   Glucose-Capillary 102 (*)    All other components within normal limits  CULTURE, BLOOD (ROUTINE X 2)  CULTURE, BLOOD (ROUTINE X 2)  TROPONIN I  URINALYSIS, COMPLETE (UACMP) WITH MICROSCOPIC  BRAIN NATRIURETIC PEPTIDE   CBG MONITORING, ED   ____________________________________________  EKG  ED ECG REPORT I, Eula Listen, the attending physician, personally viewed and interpreted this ECG.   Date: 05/14/2017  EKG Time: 1350  Rate: 62  Rhythm: normal sinus rhythm  Axis: normal  Intervals:none  ST&T Change: Nonspecific T wave inversions in V1 and V2.  No STEMI.  This EKG is compared to 02/13/16 and is grossly unchanged.    ____________________________________________  [id number]  Dg Chest 2 View  Result Date: 05/14/2017 CLINICAL DATA:  Cough EXAM: CHEST  2 VIEW COMPARISON:  05/04/2017 FINDINGS: There is mild diffuse bilateral interstitial prominence. There is no focal parenchymal opacity. There is no pleural effusion or pneumothorax. There is stable cardiomegaly. There is thoracic aortic atherosclerosis. The osseous structures are unremarkable. IMPRESSION: Mild bilateral diffuse  interstitial prominence which may reflect mild interstitial edema versus interstitial infection. Electronically Signed   By: Kathreen Devoid   On: 05/14/2017 14:28    ____________________________________________   PROCEDURES  Procedure(s) performed: None  Procedures  Critical Care performed: No ____________________________________________   INITIAL IMPRESSION / ASSESSMENT AND PLAN / ED COURSE  Pertinent labs & imaging results that were available during my care of the patient were reviewed by me and considered in my medical decision making (see chart for details).  84 y.o. female recently hospitalized for influenza and pneumonia, COPD exacerbation, sent from [place] for generalized weakness.  Overall, the patient has no focal findings that would be suggestive of ACS or MI, acute CVA, or severe life-threatening infection.  However, we will repeat her chest x-ray to evaluate for continued resolution of her pneumonia, and get a urinalysis to rule out UTI.  No CT head imaging is indicated at this time  given that the patient has no focal neurologic deficits on my examination.  We will also check for electrolyte disturbance, including hypoglycemia.  Plan reevaluation for final disposition.  ----------------------------------------- 2:09 PM on 05/14/2017 -----------------------------------------  The patient has a venous blood gas that shows a pH of 7.29 with a PCO2 of 61 and a PO2 of less than 31.  This is not significantly different that her last VBG and with her hx of COPD, she is likely a chronic retainer of CO2, today pH on the low side of normal.  ----------------------------------------- 3:03 PM on 05/14/2017 -----------------------------------------  The patient has a new elevation in her white blood cell count, and she has infiltrates on the chest x-ray bilaterally.  I am concerned that she may have a bacterial overgrowth with hospital-acquired pneumonia, in the setting of complete treatment with Tamiflu for her influenza.  I have ordered IV antibiotics and blood cultures.  I was also able to speak to the patient's family, who reports that for the past 3 days, she has been somnolent and falling asleep in the middle of a sentence, which is a new change for her.  At this time, the patient will be admitted for further evaluation and treatment. ____________________________________________  FINAL CLINICAL IMPRESSION(S) / ED DIAGNOSES  Final diagnoses:  HCAP (healthcare-associated pneumonia)  Altered mental status, unspecified altered mental status type  Hypercapnia         NEW MEDICATIONS STARTED DURING THIS VISIT:  New Prescriptions   No medications on file      Eula Listen, MD 05/14/17 1409    Eula Listen, MD 05/14/17 1413    Eula Listen, MD 05/14/17 1504

## 2017-05-14 NOTE — ED Triage Notes (Signed)
Pt to ED via EMS from [facility] where pt was sent after discharge for pneumonia and flu. Per EMS pt was seen by PT today and states that she was more weak than usual. Family states she was hard to arouse this afternoon. PT is A&Ox4, VS stable. MD at bedside

## 2017-05-14 NOTE — ED Notes (Signed)
Pt taken to room via stretcher. VSS. NAD. Family at bedside.

## 2017-05-15 LAB — CBC
HCT: 43.2 % (ref 35.0–47.0)
Hemoglobin: 14.5 g/dL (ref 12.0–16.0)
MCH: 29.4 pg (ref 26.0–34.0)
MCHC: 33.6 g/dL (ref 32.0–36.0)
MCV: 87.6 fL (ref 80.0–100.0)
PLATELETS: 190 10*3/uL (ref 150–440)
RBC: 4.93 MIL/uL (ref 3.80–5.20)
RDW: 14.7 % — AB (ref 11.5–14.5)
WBC: 11.2 10*3/uL — ABNORMAL HIGH (ref 3.6–11.0)

## 2017-05-15 LAB — BLOOD GAS, VENOUS
Acid-Base Excess: 1 mmol/L (ref 0.0–2.0)
Bicarbonate: 29.3 mmol/L — ABNORMAL HIGH (ref 20.0–28.0)
FIO2: 0.21
PCO2 VEN: 61 mmHg — AB (ref 44.0–60.0)
Patient temperature: 37
pH, Ven: 7.29 (ref 7.250–7.430)
pO2, Ven: <31 mmHg — CL (ref 32.0–45.0)

## 2017-05-15 LAB — BASIC METABOLIC PANEL
Anion gap: 10 (ref 5–15)
BUN: 40 mg/dL — ABNORMAL HIGH (ref 6–20)
CALCIUM: 8.1 mg/dL — AB (ref 8.9–10.3)
CHLORIDE: 102 mmol/L (ref 101–111)
CO2: 21 mmol/L — ABNORMAL LOW (ref 22–32)
CREATININE: 1.11 mg/dL — AB (ref 0.44–1.00)
GFR, EST AFRICAN AMERICAN: 50 mL/min — AB (ref >60)
GFR, EST NON AFRICAN AMERICAN: 43 mL/min — AB (ref >60)
GLUCOSE: 88 mg/dL (ref 65–99)
Potassium: 4.3 mmol/L (ref 3.5–5.1)
Sodium: 133 mmol/L — ABNORMAL LOW (ref 135–145)

## 2017-05-15 LAB — STREP PNEUMONIAE URINARY ANTIGEN: Strep Pneumo Urinary Antigen: NEGATIVE

## 2017-05-15 LAB — MRSA PCR SCREENING: MRSA by PCR: NEGATIVE

## 2017-05-15 MED ORDER — ORAL CARE MOUTH RINSE
15.0000 mL | Freq: Two times a day (BID) | OROMUCOSAL | Status: DC
  Administered 2017-05-15 – 2017-05-17 (×5): 15 mL via OROMUCOSAL

## 2017-05-15 MED ORDER — FUROSEMIDE 20 MG PO TABS
20.0000 mg | ORAL_TABLET | Freq: Every day | ORAL | Status: DC
  Administered 2017-05-15 – 2017-05-17 (×3): 20 mg via ORAL
  Filled 2017-05-15 (×3): qty 1

## 2017-05-15 MED ORDER — VITAMIN D 1000 UNITS PO TABS
1000.0000 [IU] | ORAL_TABLET | Freq: Every day | ORAL | Status: DC
  Administered 2017-05-15 – 2017-05-17 (×3): 1000 [IU] via ORAL
  Filled 2017-05-15 (×3): qty 1

## 2017-05-15 MED ORDER — VANCOMYCIN HCL IN DEXTROSE 1-5 GM/200ML-% IV SOLN
1000.0000 mg | INTRAVENOUS | Status: DC
  Administered 2017-05-15: 1000 mg via INTRAVENOUS
  Filled 2017-05-15 (×2): qty 200

## 2017-05-15 NOTE — Plan of Care (Signed)
  Progressing Health Behavior/Discharge Planning: Ability to manage health-related needs will improve 05/15/2017 0147 - Progressing by Annitta Needs, RN Clinical Measurements: Ability to maintain clinical measurements within normal limits will improve 05/15/2017 0147 - Progressing by Annitta Needs, RN Will remain free from infection 05/15/2017 0147 - Progressing by Annitta Needs, RN Respiratory complications will improve 05/15/2017 0147 - Progressing by Annitta Needs, RN Activity: Risk for activity intolerance will decrease 05/15/2017 0147 - Progressing by Annitta Needs, RN Nutrition: Adequate nutrition will be maintained 05/15/2017 0147 - Progressing by Annitta Needs, RN Coping: Level of anxiety will decrease 05/15/2017 0147 - Progressing by Annitta Needs, RN Elimination: Will not experience complications related to bowel motility 05/15/2017 0147 - Progressing by Annitta Needs, RN Pain Managment: General experience of comfort will improve 05/15/2017 0147 - Progressing by Annitta Needs, RN Safety: Ability to remain free from injury will improve 05/15/2017 0147 - Progressing by Annitta Needs, RN

## 2017-05-15 NOTE — Clinical Social Work Note (Signed)
CSW received consult that patient is from [facility].  CSW to complete assessment at a later time.  Lisa Haney. Lisa Haney, MSW, [place]  05/15/2017 6:40 PM

## 2017-05-15 NOTE — Progress Notes (Signed)
[facility] NAME: Lisa Haney    MR#:  [id number]  DATE OF BIRTH:  01/31/1927  SUBJECTIVE:  CHIEF COMPLAINT:    REVIEW OF SYSTEMS:  CONSTITUTIONAL: No fever, fatigue or weakness.  EYES: No blurred or double vision.  EARS, NOSE, AND THROAT: No tinnitus or ear pain.  RESPIRATORY: No cough, shortness of breath, wheezing or hemoptysis.  CARDIOVASCULAR: No chest pain, orthopnea, edema.  GASTROINTESTINAL: No nausea, vomiting, diarrhea or abdominal pain.  GENITOURINARY: No dysuria, hematuria.  ENDOCRINE: No polyuria, nocturia,  HEMATOLOGY: No anemia, easy bruising or bleeding SKIN: No rash or lesion. MUSCULOSKELETAL: No joint pain or arthritis.   NEUROLOGIC: No tingling, numbness, weakness.  PSYCHIATRY: No anxiety or depression.   DRUG ALLERGIES:   Allergies  Allergen Reactions  . Doxycycline Other (See Comments)    Severe upset stomach and pain     VITALS:  Blood pressure (!) 132/54, pulse 65, temperature (!) 97.5 F (36.4 C), temperature source Oral, resp. rate 17, height 5\' 5"  (1.651 m), weight 80.6 kg (177 lb 9.6 oz), SpO2 100 %.  PHYSICAL EXAMINATION:  GENERAL:  84 y.o.-year-old patient lying in the bed with no acute distress.  EYES: Pupils equal, round, reactive to light and accommodation. No scleral icterus. Extraocular muscles intact.  HEENT: Head atraumatic, normocephalic. Oropharynx and nasopharynx clear.  NECK:  Supple, no jugular venous distention. No thyroid enlargement, no tenderness.  LUNGS: Normal breath sounds bilaterally, no wheezing, rales,rhonchi or crepitation. No use of accessory muscles of respiration.  CARDIOVASCULAR: S1, S2 normal. No murmurs, rubs, or gallops.  ABDOMEN: Soft, nontender, nondistended. Bowel sounds present. No organomegaly or mass.  EXTREMITIES: No pedal edema, cyanosis, or clubbing.  NEUROLOGIC: Cranial nerves II through XII are intact. Muscle strength 5/5 in all extremities.  Sensation intact. Gait not checked.  PSYCHIATRIC: The patient is alert and oriented x 3.  SKIN: No obvious rash, lesion, or ulcer.    LABORATORY PANEL:   CBC Recent Labs  Lab 05/15/17 0413  WBC 11.2*  HGB 14.5  HCT 43.2  PLT 190   ------------------------------------------------------------------------------------------------------------------  Chemistries  Recent Labs  Lab 05/14/17 1345  05/15/17 0413  NA 134*  --  133*  K 4.4  --  4.3  CL 99*  --  102  CO2 26  --  21*  GLUCOSE 98  --  88  BUN 43*  --  40*  CREATININE 1.26*   < > 1.11*  CALCIUM 8.4*  --  8.1*  AST 32  --   --   ALT 49  --   --   ALKPHOS 60  --   --   BILITOT 0.8  --   --    < > = values in this interval not displayed.   ------------------------------------------------------------------------------------------------------------------  Cardiac Enzymes Recent Labs  Lab 05/14/17 1345  TROPONINI <0.03   ------------------------------------------------------------------------------------------------------------------  RADIOLOGY:  Dg Chest 2 View  Result Date: 05/14/2017 CLINICAL DATA:  Cough EXAM: CHEST  2 VIEW COMPARISON:  05/04/2017 FINDINGS: There is mild diffuse bilateral interstitial prominence. There is no focal parenchymal opacity. There is no pleural effusion or pneumothorax. There is stable cardiomegaly. There is thoracic aortic atherosclerosis. The osseous structures are unremarkable. IMPRESSION: Mild bilateral diffuse interstitial prominence which may reflect mild interstitial edema versus interstitial infection. Electronically Signed   By: Kathreen Devoid   On: 05/14/2017 14:28    EKG:   Orders placed or performed during the hospital encounter of 05/14/17  .  ED EKG  . ED EKG  . EKG 12-Lead  . EKG 12-Lead    ASSESSMENT AND PLAN:    # Pneumonia, HCAP and UTI with leukocytosis Clinically better Continue cefepime and vancomycin pharmacy to dose follow-up CBC and cultures.  #  Dehydration and hyponatremia Better with IV fluids Resume holding Lasix follow-up BMP.  # Hypertension, continue home hypertension medication   # Generalized weakness, PT evaluation -SNF to follow-up with social worker   #Hearing problems-patient will be benefited with hearing aid    All the records are reviewed and case discussed with Care Management/Social Workerr. Management plans discussed with the patient, family and they are in agreement.  CODE STATUS: FC   TOTAL TIME TAKING CARE OF THIS PATIENT:  36  minutes.   POSSIBLE D/C IN  1-2 DAYS, DEPENDING ON CLINICAL CONDITION.  Note: This dictation was prepared with Dragon dictation along with smaller phrase technology. Any transcriptional errors that result from this process are unintentional.   Nicholes Mango M.D on 05/15/2017 at 2:55 PM  Between 7am to 6pm - Pager - [phone] After 6pm go to [email or web address] - password [email or web address] [facility]  [facility]  Office  [phone]  CC: Primary care physician; Glendon Axe, MD

## 2017-05-15 NOTE — Evaluation (Signed)
Physical Therapy Evaluation Patient Details Name: Lisa Haney MRN: [id number] DOB: October 21, 1926 Today's Date: 05/15/2017   History of Present Illness  84 yo Female came to ED with increased shortness of breath; She was diagnosed with pneumonia. Patient has a PMH significant for COPD, CAD, HTN; she is on 2L O2 at home at baseline. she lives at home with her son who does work during the day; patient reports having a decline in mobility in last week;   Clinical Impression  Pt is an 84 year old female who demonstrated progressing fatigue and SOB during PT at [facility] where she is a resident.  Pt reported a mild HA but no pain otherwise.  She is on 2L of O2 and presented with WNL HR and O2 during activity.  Pt presented with overall decrease in strength of UE and LE and limited shoulder overhead ROM.  Pt demonstrated Mod I with bed mobility and need for min A during initiation of STS transfer.  Pt attempted 2-3 steps with a shuffling gait and reported that she was too fatigued to continue.  PT assisted pt in transfer from bed to chair with RW instead.  PT provided pt education regarding proper hand placement during transfer when using RW and pt required repetition without presentation of carryover.  Pt will continue to benefit from skilled PT with focus on strength, tolerance to activity, proper use of AD and energy conservation.    Follow Up Recommendations SNF    Equipment Recommendations       Recommendations for Other Services OT consult     Precautions / Restrictions Precautions Precautions: Fall Restrictions Weight Bearing Restrictions: No      Mobility  Bed Mobility Overal bed mobility: Modified Independent Bed Mobility: Rolling;Supine to Sit Rolling: Modified independent (Device/Increase time)   Supine to sit: Modified independent (Device/Increase time)     General bed mobility comments: Pt performed bed mobility with use of bed rail only.  Required increased time. Pt  stated that she is 84 years old and has never felt this limited by illness.  Transfers Overall transfer level: Needs assistance Equipment used: Rolling walker (2 wheeled) Transfers: Sit to/from [facility] Sit to Stand: Min assist Stand pivot transfers: Min guard       General transfer comment: Pt required min A to initiate STS and reported increased weakness following standing upright for 30 sec.  PT educated pt concerning proper use of RW and hand placement for safety.  Pt required repetative education prior to demonstrating carryover.  Ambulation/Gait Ambulation/Gait assistance: Min guard Ambulation Distance (Feet): 5 Feet Assistive device: Rolling walker (2 wheeled)     Gait velocity interpretation: Below normal speed for age/gender General Gait Details: Pt attempted 2-3 steps, sliding feet on floor, with RW and reported being too fatigued to continue. Pt opted to return to bed and tranfer from opposite side in order to perform a standing pivot transfer to recliner.  Stairs            Wheelchair Mobility    Modified Rankin (Stroke Patients Only)       Balance Overall balance assessment: Modified Independent Sitting-balance support: Bilateral upper extremity supported       Standing balance support: Bilateral upper extremity supported                                 Pertinent Vitals/Pain Pain Assessment: Faces Faces Pain Scale: Hurts a  little bit Pain Location: Pt reports no joint pain but states that she has a mild HA. Pain Intervention(s): Limited activity within patient's tolerance    Home Living Family/patient expects to be discharged to:: Skilled nursing facility Living Arrangements: Alone Available Help at Discharge: [place] Type of Home: House Home Access: Level entry Entrance Stairs-Rails: None   Home Layout: One level Home Equipment: Environmental consultant - 2 wheels      Prior Function     Gait / Transfers  Assistance Needed: Pt states that someone assists her in ambulation in hallway and to bathroom near her room.  She uses a RW for assistance as well.  ADL's / Homemaking Assistance Needed: Requires assistance with bathing and dressing        Hand Dominance        Extremity/Trunk Assessment   Upper Extremity Assessment Upper Extremity Assessment: Generalized weakness    Lower Extremity Assessment Lower Extremity Assessment: Generalized weakness    Cervical / Trunk Assessment Cervical / Trunk Assessment: Kyphotic  Communication   Communication: HOH  Cognition Arousal/Alertness: Awake/alert Behavior During Therapy: WFL for tasks assessed/performed Overall Cognitive Status: Within Functional Limits for tasks assessed                                        General Comments      Exercises     Assessment/Plan    PT Assessment Patient needs continued PT services  PT Problem List Decreased strength;Decreased range of motion;Decreased coordination;Decreased activity tolerance;Decreased balance;Decreased mobility;Decreased knowledge of use of DME       PT Treatment Interventions DME instruction;Gait training;Functional mobility training;Therapeutic activities;Therapeutic exercise;Balance training;Patient/family education    PT Goals (Current goals can be found in the Care Plan section)  Acute Rehab PT Goals Patient Stated Goal: To return to [facility] and continue to improve strength. PT Goal Formulation: With patient Time For Goal Achievement: 05/29/17 Potential to Achieve Goals: Good    Frequency Min 2X/week   Barriers to discharge        Co-evaluation               AM-PAC PT "6 Clicks" Daily Activity  Outcome Measure Difficulty turning over in bed (including adjusting bedclothes, sheets and blankets)?: A Little Difficulty moving from lying on back to sitting on the side of the bed? : A Lot Difficulty sitting down on and standing up  from a chair with arms (e.g., wheelchair, bedside commode, etc,.)?: A Lot Help needed moving to and from a bed to chair (including a wheelchair)?: A Lot Help needed walking in hospital room?: A Lot Help needed climbing 3-5 steps with a railing? : A Lot 6 Click Score: 13    End of Session Equipment Utilized During Treatment: Gait belt;Oxygen Activity Tolerance: Patient limited by fatigue Patient left: in chair;with call bell/phone within reach;with chair alarm set;with nursing/sitter in room Nurse Communication: Mobility status PT Visit Diagnosis: Unsteadiness on feet (R26.81);Other abnormalities of gait and mobility (R26.89);Muscle weakness (generalized) (M62.81);History of falling (Z91.81)    Time: 2353-6144 PT Time Calculation (min) (ACUTE ONLY): 20 min   Charges:   PT Evaluation $PT Eval Low Complexity: 1 Low PT Treatments $Therapeutic Activity: 8-22 mins   PT G Codes:   PT G-Codes **NOT FOR INPATIENT CLASS** Functional Assessment Tool Used: AM-PAC 6 Clicks Basic Mobility Functional Limitation: Mobility: Walking and moving around Mobility: Walking and Moving Around  Current Status [phone]): At least 40 percent but less than 60 percent impaired, limited or restricted Mobility: Walking and Moving Around Goal Status [phone]): At least 20 percent but less than 40 percent impaired, limited or restricted    Roxanne Gates, PT, DPT  Roxanne Gates 05/15/2017, 10:59 AM

## 2017-05-15 NOTE — Progress Notes (Signed)
ANTIBIOTIC CONSULT NOTE - INITIAL  Pharmacy Consult for vancomycin Indication: pneumonia, UTI and HCAP/leukocytosis  Allergies  Allergen Reactions  . Doxycycline Other (See Comments)    Severe upset stomach and pain     Patient Measurements: Height: 5\' 5"  (165.1 cm) Weight: 177 lb 9.6 oz (80.6 kg) IBW/kg (Calculated) : 57 Adjusted Body Weight:   Vital Signs: Temp: 97.5 F (36.4 C) (01/16 0346) Temp Source: Oral (01/16 0346) BP: 132/54 (01/16 1021) Pulse Rate: 65 (01/16 1021) Intake/Output from previous day: 01/15 0701 - 01/16 0700 In: 762.5 [I.V.:712.5; IV Piggyback:50] Out: -  Intake/Output from this shift: Total I/O In: 480 [P.O.:480] Out: -   Labs: Recent Labs    05/14/17 1345 05/14/17 2125 05/15/17 0413  WBC 13.2*  --  11.2*  HGB 14.1  --  14.5  PLT 218  --  190  CREATININE 1.26* 1.28* 1.11*   Estimated Creatinine Clearance: 36.7 mL/min (A) (by C-G formula based on SCr of 1.11 mg/dL (H)). No results for input(s): VANCOTROUGH, VANCOPEAK, VANCORANDOM, GENTTROUGH, GENTPEAK, GENTRANDOM, TOBRATROUGH, TOBRAPEAK, TOBRARND, AMIKACINPEAK, AMIKACINTROU, AMIKACIN in the last 72 hours.   Microbiology: Recent Results (from the past 720 hour(s))  Culture, blood (routine x 2)     Status: None   Collection Time: 05/04/17  4:16 PM  Result Value Ref Range Status   Specimen Description BLOOD  Final   Special Requests NONE  Final   Culture   Final    NO GROWTH 5 DAYS Performed at [facility], [address], [place], [place] [address]    Report Status 05/09/2017 FINAL  Final  Culture, blood (routine x 2)     Status: None   Collection Time: 05/04/17  4:44 PM  Result Value Ref Range Status   Specimen Description BLOOD RIGHT ARM  Final   Special Requests   Final    BOTTLES DRAWN AEROBIC AND ANAEROBIC Blood Culture adequate volume   Culture   Final    NO GROWTH 5 DAYS Performed at [facility], [address] [place], [place] [address]    Report  Status 05/09/2017 FINAL  Final  Culture, expectorated sputum-assessment     Status: None   Collection Time: 05/05/17  1:13 PM  Result Value Ref Range Status   Specimen Description SPU  Final   Special Requests Normal  Final   Sputum evaluation   Final    Sputum specimen not acceptable for testing.  Please recollect.   SPOKE TO ANNA Bigelow 05/05/17 @ [place] Performed at [facility], [address] [place], [place] [address]    Report Status 05/05/2017 FINAL  Final  MRSA PCR Screening     Status: None   Collection Time: 05/05/17  3:15 PM  Result Value Ref Range Status   MRSA by PCR NEGATIVE NEGATIVE Final    Comment:        The GeneXpert MRSA Assay (FDA approved for NASAL specimens only), is one component of a comprehensive MRSA colonization surveillance program. It is not intended to diagnose MRSA infection nor to guide or monitor treatment for MRSA infections. Performed at [facility], [address] [place], [place] [address]   Blood culture (routine x 2)     Status: None (Preliminary result)   Collection Time: 05/14/17  1:45 PM  Result Value Ref Range Status   Specimen Description BLOOD RAC  Final   Special Requests   Final    BOTTLES DRAWN AEROBIC AND ANAEROBIC Blood Culture results  may not be optimal due to an excessive volume of blood received in culture bottles   Culture   Final    NO GROWTH < 24 HOURS Performed at [facility], [address] [place], [place] [address]    Report Status PENDING  Incomplete  Blood culture (routine x 2)     Status: None (Preliminary result)   Collection Time: 05/14/17  3:10 PM  Result Value Ref Range Status   Specimen Description BLOOD RAC  Final   Special Requests   Final    BOTTLES DRAWN AEROBIC AND ANAEROBIC Blood Culture adequate volume   Culture   Final    NO GROWTH < 24 HOURS Performed at [facility], [address] [place], [place] [address]    Report Status PENDING   Incomplete    Medical History: Past Medical History:  Diagnosis Date  . Arthritis   . Cancer ([place])    skin cancer on hand  . COPD (chronic obstructive pulmonary disease) ([place])   . Coronary artery disease    stent  . Diverticulitis   . Hypertension   . Stroke [facility])    TIA  . TIA (transient ischemic attack)     Medications:  Infusions:  . sodium chloride 75 mL/hr at 05/15/17 1053  . ceFEPime (MAXIPIME) IV 2 g (05/15/17 1040)  . vancomycin     Assessment: 57 yof with SOB/weakness from ALF. Pharmacy consulted to dose cefepime for PNA/UTI. Now reconsulted to add vancomycin pending MRSA screen.  Goal of Therapy:  Vancomycin trough 15 to 20 mcg/mL.   Plan:  Vancomycin 1 gm IV Q24H, predicted trough 16 mcg/mL. Pharmacy will continue to follow and adjust as needed to maintain trough 15 to 20 mcg/mL.   Vd 46.3 L, Ke 0.035 hr-1, T1/2 19.9 hr  Laural Benes, Pharm.D., BCPS Clinical Pharmacist 05/15/2017,1:25 PM

## 2017-05-16 LAB — CBC
HCT: 43.7 % (ref 35.0–47.0)
Hemoglobin: 14.5 g/dL (ref 12.0–16.0)
MCH: 29 pg (ref 26.0–34.0)
MCHC: 33.1 g/dL (ref 32.0–36.0)
MCV: 87.6 fL (ref 80.0–100.0)
PLATELETS: 181 10*3/uL (ref 150–440)
RBC: 4.99 MIL/uL (ref 3.80–5.20)
RDW: 14.7 % — AB (ref 11.5–14.5)
WBC: 10 10*3/uL (ref 3.6–11.0)

## 2017-05-16 LAB — BASIC METABOLIC PANEL
Anion gap: 8 (ref 5–15)
BUN: 34 mg/dL — AB (ref 6–20)
CALCIUM: 8.3 mg/dL — AB (ref 8.9–10.3)
CO2: 26 mmol/L (ref 22–32)
CREATININE: 1.1 mg/dL — AB (ref 0.44–1.00)
Chloride: 99 mmol/L — ABNORMAL LOW (ref 101–111)
GFR calc Af Amer: 50 mL/min — ABNORMAL LOW (ref >60)
GFR, EST NON AFRICAN AMERICAN: 44 mL/min — AB (ref >60)
GLUCOSE: 94 mg/dL (ref 65–99)
Potassium: 4.4 mmol/L (ref 3.5–5.1)
Sodium: 133 mmol/L — ABNORMAL LOW (ref 135–145)

## 2017-05-16 LAB — PROCALCITONIN: Procalcitonin: <0.1 ng/mL

## 2017-05-16 MED ORDER — METOPROLOL TARTRATE 50 MG PO TABS
50.0000 mg | ORAL_TABLET | Freq: Two times a day (BID) | ORAL | Status: DC
  Administered 2017-05-17: 50 mg via ORAL
  Filled 2017-05-16 (×2): qty 1

## 2017-05-16 MED ORDER — CLONIDINE HCL 0.1 MG PO TABS: 0.1000 mg | ORAL_TABLET | Freq: Every day | ORAL | Status: DC

## 2017-05-16 NOTE — Progress Notes (Signed)
Home cpap at bedside. Patient will have rn to call when she is ready to go on so that RT can assist her

## 2017-05-16 NOTE — Clinical Social Work Note (Signed)
Clinical Social Work Assessment  Patient Details  Name: Lisa Haney MRN: [id number] Date of Birth: Oct 22, 1926  Date of referral:  05/16/17               Reason for consult:  Facility Placement                Permission sought to share information with:  Facility Sport and exercise psychologist, Family Supports Permission granted to share information::  Yes, Verbal Permission Granted  Name::     [place], [place]   [phone] and [place], Circle [phone] and Day,[name] Pandora Leiter   [phone]   Agency::  SNF admissions  Relationship::     Contact Information:     Housing/Transportation Living arrangements for the past 2 months:  [place] of Information:  Patient Patient Interpreter Needed:  None Criminal Activity/Legal Involvement Pertinent to Current Situation/Hospitalization:  No - Comment as needed Significant Relationships:  Adult Children Lives with:    Do you feel safe going back to the place where you live?  No Need for family participation in patient care:  No (Coment)  Care giving concerns:  Patient is a short term rehab patient at [facility], and she did not express any concerns about the care being provided at [facility].   Social Worker assessment / plan:  Patient is an 84 year old female who is alert and oriented x4 she has been at [facility] for a couple of days receiving rehab, but then was readmitted to the hospital.  Patient states she feels likes she is progressing well with therapy at SNF and does not have any complaints about the care.  Patient expressed that she is looking forward to returning and continuing with her therapy.  Patient was explained that insurance will just pick up where it left off paying for stay at SNF.  Patient did not express any other questions or concerns and gave CSW permission to fax updated clinicals to [facility].  Employment status:  Retired Forensic scientist:   Medicare PT Recommendations:  [place] / Referral to community resources:  [place]  Patient/Family's Response to care: Patient is agreeable about returning back to [facility] for rehab.  Patient/Family's Understanding of and Emotional Response to Diagnosis, Current Treatment, and Prognosis:  Patient is hopeful that she will be able to return back to SNF soon to continue with her therapy.  Emotional Assessment Appearance:  Appears stated age Attitude/Demeanor/Rapport:    Affect (typically observed):  Appropriate, Calm, Stable Orientation:  Oriented to Self, Oriented to Place, Oriented to  Time, Oriented to Situation Alcohol / Substance use:  Not Applicable Psych involvement (Current and /or in the community):  No (Comment)  Discharge Needs  Concerns to be addressed:  Discharge Planning Concerns Readmission within the last 30 days:  No Current discharge risk:  Dependent with Mobility Barriers to Discharge:  Continued Medical Work up   Anell Barr 05/16/2017, 6:42 PM

## 2017-05-16 NOTE — NC FL2 (Signed)
[place] LEVEL OF CARE SCREENING TOOL     IDENTIFICATION  Patient Name: Lisa Haney Birthdate: 18-Nov-1926 Sex: female Admission Date (Current Location): 05/14/2017  [place] and [place] Number:  Engineering geologist and Address:  [facility], [address], [place], [place] [address]      Provider Number: [id number]  Attending Physician Name and Address:  Loletha Grayer, MD  Relative Name and Phone Number:       Current Level of Care: Hospital Recommended Level of Care: [place] Prior Approval Number:    Date Approved/Denied:   PASRR Number: [id number]  Discharge Plan: SNF    Current Diagnoses: Patient Active Problem List   Diagnosis Date Noted  . Pneumonia 05/04/2017  . COPD exacerbation ([place]) 02/13/2016    Orientation RESPIRATION BLADDER Height & Weight     Self, Time, Place, Situation  O2(2L) Incontinent Weight: 177 lb 9.6 oz (80.6 kg) Height:  5\' 5"  (165.1 cm)  BEHAVIORAL SYMPTOMS/MOOD NEUROLOGICAL BOWEL NUTRITION STATUS      Continent Diet(Cardiac)  AMBULATORY STATUS COMMUNICATION OF NEEDS Skin   Limited Assist Verbally Normal                       Personal Care Assistance Level of Assistance  Bathing, Feeding, Dressing Bathing Assistance: Limited assistance Feeding assistance: Independent Dressing Assistance: Limited assistance     Functional Limitations Info  Sight, Hearing, Speech Sight Info: Adequate Hearing Info: Adequate Speech Info: Adequate    SPECIAL CARE FACTORS FREQUENCY  PT (By licensed PT), OT (By licensed OT)     PT Frequency: 5x a week OT Frequency: 5x a week            Contractures Contractures Info: Not present    Additional Factors Info  Code Status, Allergies, Psychotropic Code Status Info: Full Code Allergies Info: Doxycycline Psychotropic Info: venlafaxine (EFFEXOR) tablet 75 mg          Current Medications (05/16/2017):  This is the current  hospital active medication list Current Facility-Administered Medications  Medication Dose Route Frequency Provider Last Rate Last Dose  . 0.9 %  sodium chloride infusion   Intravenous Continuous Demetrios Loll, MD 75 mL/hr at 05/15/17 1743    . acetaminophen (TYLENOL) tablet 650 mg  650 mg Oral Q6H PRN Demetrios Loll, MD       Or  . acetaminophen (TYLENOL) suppository 650 mg  650 mg Rectal Q6H PRN Demetrios Loll, MD      . albuterol (PROVENTIL) (2.5 MG/3ML) 0.083% nebulizer solution 2.5 mg  2.5 mg Nebulization Q2H PRN Demetrios Loll, MD      . amLODipine (NORVASC) tablet 5 mg  5 mg Oral Daily Demetrios Loll, MD   5 mg at 05/15/17 1023  . aspirin EC tablet 81 mg  81 mg Oral Daily Demetrios Loll, MD   81 mg at 05/15/17 1022  . benzonatate (TESSALON) capsule 100 mg  100 mg Oral TID Demetrios Loll, MD   100 mg at 05/15/17 2202  . bisacodyl (DULCOLAX) EC tablet 5 mg  5 mg Oral Daily PRN Demetrios Loll, MD      . ceFEPIme (MAXIPIME) 2 g in dextrose 5 % 50 mL IVPB  2 g Intravenous Q12H Demetrios Loll, MD   Stopped at 05/15/17 2354  . cholecalciferol (VITAMIN D) tablet 1,000 Units  1,000 Units Oral Daily Nicholes Mango, MD   1,000 Units at 05/15/17 1802  . cloNIDine (CATAPRES) tablet 0.2 mg  0.2 mg Oral Daily Demetrios Loll, MD   0.2 mg at 05/15/17 1022  . cyclobenzaprine (FLEXERIL) tablet 5 mg  5 mg Oral Q12H PRN Demetrios Loll, MD      . furosemide (LASIX) tablet 20 mg  20 mg Oral Daily Gouru, Aruna, MD   20 mg at 05/15/17 1803  . guaiFENesin (ROBITUSSIN) 100 MG/5ML solution 100 mg  5 mL Oral Q4H PRN Demetrios Loll, MD      . heparin injection 5,000 Units  5,000 Units Subcutaneous Q8H Demetrios Loll, MD   5,000 Units at 05/16/17 [phone]  . HYDROcodone-acetaminophen (NORCO/VICODIN) 5-325 MG per tablet 1-2 tablet  1-2 tablet Oral Q4H PRN Demetrios Loll, MD      . isosorbide dinitrate (ISORDIL) tablet 20 mg  20 mg Oral TID Lance Coon, MD   20 mg at 05/16/17 0009  . levothyroxine (SYNTHROID, LEVOTHROID) tablet 150 mcg  150 mcg Oral Daily Demetrios Loll, MD   150  mcg at 05/16/17 0453  . lisinopril (PRINIVIL,ZESTRIL) tablet 20 mg  20 mg Oral Daily Demetrios Loll, MD   20 mg at 05/15/17 1023  . MEDLINE mouth rinse  15 mL Mouth Rinse BID Demetrios Loll, MD   15 mL at 05/15/17 2203  . metoprolol tartrate (LOPRESSOR) tablet 100 mg  100 mg Oral BID Lance Coon, MD   100 mg at 05/15/17 2202  . nystatin (MYCOSTATIN) 100000 UNIT/ML suspension 500,000 Units  5 mL Oral QID Demetrios Loll, MD   500,000 Units at 05/15/17 2201  . ondansetron (ZOFRAN) tablet 4 mg  4 mg Oral Q6H PRN Demetrios Loll, MD       Or  . ondansetron [facility]) injection 4 mg  4 mg Intravenous Q6H PRN Demetrios Loll, MD      . pravastatin (PRAVACHOL) tablet 40 mg  40 mg Oral QHS Demetrios Loll, MD   40 mg at 05/15/17 2202  . senna-docusate (Senokot-S) tablet 1 tablet  1 tablet Oral QHS PRN Demetrios Loll, MD      . traMADol-acetaminophen [facility]) 37.5-325 MG per tablet 1 tablet  1 tablet Oral Q8H PRN Demetrios Loll, MD   1 tablet at 05/14/17 2139  . vancomycin (VANCOCIN) IVPB 1000 mg/200 mL premix  1,000 mg Intravenous Q24H Nicholes Mango, MD   Stopped at 05/15/17 1440  . venlafaxine (EFFEXOR) tablet 75 mg  75 mg Oral Q breakfast Demetrios Loll, MD   75 mg at 05/15/17 1023     Discharge Medications: Please see discharge summary for a list of discharge medications.  Relevant Imaging Results:  Relevant Lab Results:   Additional Information SSN [id number]  Ross Ludwig, [place]

## 2017-05-16 NOTE — Progress Notes (Signed)
Patient ID: [name], female   DOB: 09-03-1926, 84 y.o.   MRN: [id number]  [employer] PROGRESS NOTE  Lisa Haney YPP:[id number] DOB: 03/07/1927 DOA: 05/14/2017 PCP: Glendon Axe, MD  HPI/Subjective: Patient feeling weak and tired.  She feels out of it.  She states that they had trouble awakening her and sent her back to the hospital.  She states her breathing is better.  Objective: Vitals:   05/16/17 1047 05/16/17 1243  BP: (!) 152/54 (!) 109/35  Pulse: 66 68  Resp:  18  Temp:  98.3 F (36.8 C)  SpO2:  100%    Filed Weights   05/14/17 1941  Weight: 80.6 kg (177 lb 9.6 oz)    ROS: Review of Systems  Constitutional: Negative for chills and fever.  Eyes: Negative for blurred vision.  Respiratory: Negative for cough and shortness of breath.   Cardiovascular: Negative for chest pain.  Gastrointestinal: Negative for abdominal pain, constipation, diarrhea, nausea and vomiting.  Genitourinary: Negative for dysuria.  Musculoskeletal: Negative for joint pain.  Neurological: Negative for dizziness and headaches.   Exam: Physical Exam  HENT:  Nose: No mucosal edema.  Mouth/Throat: No oropharyngeal exudate or posterior oropharyngeal edema.  Eyes: Conjunctivae, EOM and lids are normal. Pupils are equal, round, and reactive to light.  Neck: No JVD present. Carotid bruit is not present. No edema present. No thyroid mass and no thyromegaly present.  Cardiovascular: S1 normal and S2 normal. Exam reveals no gallop.  No murmur heard. Pulses:      Dorsalis pedis pulses are 2+ on the right side, and 2+ on the left side.  Respiratory: No respiratory distress. She has decreased breath sounds in the right lower field and the left lower field. She has no wheezes. She has rhonchi in the right lower field and the left lower field. She has no rales.  GI: Soft. Bowel sounds are normal. There is no tenderness.  Musculoskeletal:       Right ankle: She exhibits swelling.        Left ankle: She exhibits swelling.  Lymphadenopathy:    She has no cervical adenopathy.  Neurological: She is alert. No cranial nerve deficit.  Skin: Skin is warm. No rash noted. Nails show no clubbing.  Psychiatric: She has a normal mood and affect.      Data Reviewed: Basic Metabolic Panel: Recent Labs  Lab 05/14/17 1345 05/14/17 2125 05/15/17 0413 05/16/17 0451  NA 134*  --  133* 133*  K 4.4  --  4.3 4.4  CL 99*  --  102 99*  CO2 26  --  21* 26  GLUCOSE 98  --  88 94  BUN 43*  --  40* 34*  CREATININE 1.26* 1.28* 1.11* 1.10*  CALCIUM 8.4*  --  8.1* 8.3*   Liver Function Tests: Recent Labs  Lab 05/14/17 1345  AST 32  ALT 49  ALKPHOS 60  BILITOT 0.8  PROT 5.9*  ALBUMIN 2.9*   CBC: Recent Labs  Lab 05/14/17 1345 05/15/17 0413 05/16/17 0451  WBC 13.2* 11.2* 10.0  HGB 14.1 14.5 14.5  HCT 43.4 43.2 43.7  MCV 87.9 87.6 87.6  PLT 218 190 181   Cardiac Enzymes: Recent Labs  Lab 05/14/17 1345  TROPONINI <0.03   BNP (last 3 results) Recent Labs    05/14/17 1345  BNP 399.0*    CBG: Recent Labs  Lab 05/14/17 1345  GLUCAP 102*    Recent Results (from the past 240 hour(s))  Blood culture (routine x 2)     Status: None (Preliminary result)   Collection Time: 05/14/17  1:45 PM  Result Value Ref Range Status   Specimen Description BLOOD RAC  Final   Special Requests   Final    BOTTLES DRAWN AEROBIC AND ANAEROBIC Blood Culture results may not be optimal due to an excessive volume of blood received in culture bottles   Culture   Final    NO GROWTH 2 DAYS Performed at [facility], [address] [place], [place] [address]    Report Status PENDING  Incomplete  Urine Culture     Status: Abnormal (Preliminary result)   Collection Time: 05/14/17  2:20 PM  Result Value Ref Range Status   Specimen Description   Final    URINE, RANDOM Performed at [facility], [address] [place], [place] [address]    Special Requests   Final     NONE Performed at [facility], [address], [place], [place] [address]    Culture (A)  Final    >=100,000 COLONIES/mL CITROBACTER FREUNDII SUSCEPTIBILITIES TO FOLLOW Performed at [facility], [place] [address] [place], [place] [address]    Report Status PENDING  Incomplete  Blood culture (routine x 2)     Status: None (Preliminary result)   Collection Time: 05/14/17  3:10 PM  Result Value Ref Range Status   Specimen Description BLOOD RAC  Final   Special Requests   Final    BOTTLES DRAWN AEROBIC AND ANAEROBIC Blood Culture adequate volume   Culture   Final    NO GROWTH 2 DAYS Performed at [facility], [address], [place], [place] [address]    Report Status PENDING  Incomplete  MRSA PCR Screening     Status: None   Collection Time: 05/15/17  1:30 PM  Result Value Ref Range Status   MRSA by PCR NEGATIVE NEGATIVE Final    Comment:        The GeneXpert MRSA Assay (FDA approved for NASAL specimens only), is one component of a comprehensive MRSA colonization surveillance program. It is not intended to diagnose MRSA infection nor to guide or monitor treatment for MRSA infections. Performed at [facility], [address] [place], [place] [address]       Scheduled Meds: . amLODipine  5 mg Oral Daily  . aspirin EC  81 mg Oral Daily  . benzonatate  100 mg Oral TID  . cholecalciferol  1,000 Units Oral Daily  . [START ON 05/17/2017] cloNIDine  0.1 mg Oral Daily  . furosemide  20 mg Oral Daily  . heparin  5,000 Units Subcutaneous Q8H  . isosorbide dinitrate  20 mg Oral TID  . levothyroxine  150 mcg Oral Daily  . lisinopril  20 mg Oral Daily  . mouth rinse  15 mL Mouth Rinse BID  . metoprolol tartrate  50 mg Oral BID  . nystatin  5 mL Oral QID  . pravastatin  40 mg Oral QHS  . venlafaxine  75 mg Oral Q breakfast   Continuous Infusions: . ceFEPime (MAXIPIME) IV 2 g (05/16/17 1049)    Assessment/Plan:   1. Acute cystitis  with hematuria.  Urine culture growing out Citrobacter.  Awaiting sensitivities.  Patient currently on Maxipime. 2. I doubt this is bacterial pneumonia.  The patient recently got over the flu.  Her pro-calcitonin is still negative. 3. Dehydration with hyponatremia.  Patient's sodium is stable at 133.  Creatinine has improved a little bit.  Patient eating.  Try to stop IV fluids. 4. Hypertension.  Cut back on metoprolol and clonidine at this point.  Since blood pressure is on the softer side. 5. Generalized weakness.  PT recommended rehab again. 6. Hyperlipidemia unspecified on pravastatin 7. Hypothyroidism unspecified on levothyroxine 8. Chronic respiratory failure on 2 L of oxygen 9. Previous thrombocytopenia continues to improve.  Likely from viral infection previously.  Code Status:     Code Status Orders  (From admission, onward)        Start     Ordered   05/14/17 1928  Full code  Continuous     05/14/17 1927    Code Status History    Date Active Date Inactive Code Status Order ID Comments User Context   05/04/2017 17:55 05/09/2017 15:07 Full Code [id number]  Fritzi Mandes, MD Inpatient   06/21/2016 09:59 06/21/2016 14:13 Full Code [id number]  Isaias Cowman, MD Inpatient   02/13/2016 04:17 02/15/2016 20:21 Full Code [id number]  Hugelmeyer, Ubaldo Glassing, DO Inpatient      Disposition Plan: Back to rehab hopefully tomorrow  Antibiotics:  Cefepime  Time spent: 26 minutes  [place]

## 2017-05-17 LAB — URINE CULTURE: Culture: >=100000 — AB

## 2017-05-17 LAB — PROCALCITONIN: Procalcitonin: <0.1 ng/mL

## 2017-05-17 MED ORDER — TRAZODONE HCL 50 MG PO TABS: 50.0000 mg | ORAL_TABLET | Freq: Every day | ORAL | Status: DC

## 2017-05-17 MED ORDER — METOPROLOL TARTRATE 50 MG PO TABS: 50.0000 mg | ORAL_TABLET | Freq: Two times a day (BID) | ORAL | 0 refills | Status: DC

## 2017-05-17 MED ORDER — LEVOFLOXACIN 750 MG PO TABS
750.0000 mg | ORAL_TABLET | ORAL | Status: DC
  Administered 2017-05-17: 750 mg via ORAL
  Filled 2017-05-17: qty 1

## 2017-05-17 MED ORDER — LEVOFLOXACIN 750 MG PO TABS: 750.0000 mg | ORAL_TABLET | ORAL | 0 refills | Status: DC

## 2017-05-17 MED ORDER — TRAZODONE HCL 50 MG PO TABS: 50.0000 mg | ORAL_TABLET | Freq: Every evening | ORAL | 0 refills | Status: DC | PRN

## 2017-05-17 NOTE — Progress Notes (Signed)
Patient is medically stable for D/C back to [facility] today. Per [facility] admissions coordinator at [facility] patient can come today to room 401. RN will call report and arrange EMS for transport. Clinical Education officer, museum (CSW) sent D/C orders to [facility] via [place]. Patient is aware of above. CSW left patient's son Lisa Haney a voicemail making him aware of above. Please reconsult if future social work needs arise. CSW signing off.   [employer], LCSW [phone]

## 2017-05-17 NOTE — Plan of Care (Signed)
  Progressing Urinary Elimination: Signs and symptoms of infection will decrease 05/17/2017 1203 - Progressing by Rowe Robert, RN Health Behavior/Discharge Planning: Ability to manage health-related needs will improve 05/17/2017 1203 - Progressing by Rowe Robert, RN Clinical Measurements: Ability to maintain clinical measurements within normal limits will improve 05/17/2017 1203 - Progressing by Rowe Robert, RN Will remain free from infection 05/17/2017 1203 - Progressing by Rowe Robert, RN Respiratory complications will improve 05/17/2017 1203 - Progressing by Rowe Robert, RN Activity: Risk for activity intolerance will decrease 05/17/2017 1203 - Progressing by Rowe Robert, RN Nutrition: Adequate nutrition will be maintained 05/17/2017 1203 - Progressing by Rowe Robert, RN Coping: Level of anxiety will decrease 05/17/2017 1203 - Progressing by Rowe Robert, RN Elimination: Will not experience complications related to bowel motility 05/17/2017 1203 - Progressing by Rowe Robert, RN Pain Managment: General experience of comfort will improve 05/17/2017 1203 - Progressing by Rowe Robert, RN Safety: Ability to remain free from injury will improve 05/17/2017 1203 - Progressing by Rowe Robert, RN

## 2017-05-17 NOTE — Plan of Care (Signed)
  Progressing Urinary Elimination: Signs and symptoms of infection will decrease 05/17/2017 1303 - Progressing by Rowe Robert, RN 05/17/2017 1203 - Progressing by Rowe Robert, RN Health Behavior/Discharge Planning: Ability to manage health-related needs will improve 05/17/2017 1303 - Progressing by Rowe Robert, RN 05/17/2017 1203 - Progressing by Rowe Robert, RN Clinical Measurements: Ability to maintain clinical measurements within normal limits will improve 05/17/2017 1303 - Progressing by Rowe Robert, RN 05/17/2017 1203 - Progressing by Rowe Robert, RN Will remain free from infection 05/17/2017 1303 - Progressing by Rowe Robert, RN 05/17/2017 1203 - Progressing by Rowe Robert, RN Respiratory complications will improve 05/17/2017 1303 - Progressing by Rowe Robert, RN 05/17/2017 1203 - Progressing by Rowe Robert, RN Activity: Risk for activity intolerance will decrease 05/17/2017 1303 - Progressing by Rowe Robert, RN 05/17/2017 1203 - Progressing by Rowe Robert, RN Nutrition: Adequate nutrition will be maintained 05/17/2017 1303 - Progressing by Rowe Robert, RN 05/17/2017 1203 - Progressing by Rowe Robert, RN Coping: Level of anxiety will decrease 05/17/2017 1303 - Progressing by Rowe Robert, RN 05/17/2017 1203 - Progressing by Rowe Robert, RN Elimination: Will not experience complications related to bowel motility 05/17/2017 1303 - Progressing by Rowe Robert, RN 05/17/2017 1203 - Progressing by Rowe Robert, RN Pain Managment: General experience of comfort will improve 05/17/2017 1303 - Progressing by Rowe Robert, RN 05/17/2017 1203 - Progressing by Rowe Robert, RN Safety: Ability to remain free from injury will improve 05/17/2017 1303 - Progressing by Rowe Robert, RN 05/17/2017 1203 - Progressing by Rowe Robert, RN

## 2017-05-17 NOTE — Progress Notes (Signed)
Assisted patient with home cpap. Unit powers on. Unit plugged up to red outlet. Patient verified unit was functioning as normal

## 2017-05-17 NOTE — Discharge Summary (Signed)
[facility] [place] NAME: Lisa Haney    MR#:  [id number]  [place]:  [birth year]  DATE OF ADMISSION:  05/14/2017 ADMITTING PHYSICIAN: Demetrios Loll, MD  DATE OF DISCHARGE: 05/17/2017  PRIMARY CARE PHYSICIAN: Glendon Axe, MD    ADMISSION DIAGNOSIS:  Hypercapnia [R06.89] HCAP (healthcare-associated pneumonia) [J18.9] Altered mental status, unspecified altered mental status type [R41.82]  DISCHARGE DIAGNOSIS:  UTI  SECONDARY DIAGNOSIS:   Past Medical History:  Diagnosis Date  . Arthritis   . Cancer ([place])    skin cancer on hand  . COPD (chronic obstructive pulmonary disease) ([place])   . Coronary artery disease    stent  . Diverticulitis   . Hypertension   . Stroke [facility])    TIA  . TIA (transient ischemic attack)     HOSPITAL COURSE:   1.  Acute cystitis with hematuria.  Urine culture growing out Citrobacter.  It is sensitive to Cipro.  I will switch to high-dose Levaquin.  Patient will get a dose today.  Patient will get a dose on Sunday and Tuesday and that will finish the course. 2.  I doubt this is bacterial pneumonia.  The patient recently got over the flu.  Her pro-calcitonin is still negative.  Levaquin would cover this anyway. 3.  Dehydration with hyponatremia.  Patient's sodium is stable.  Creatinine has improved.  Patient is eating.  I stop the IV fluid hydration.  Hold Lasix at this time. 4.  Relative hypotension with history of hypertension.  I cut back on the metoprolol and discontinue clonidine and Lasix.  Continue lisinopril and Norvasc.  If blood pressure starts going up quite a bit then can restart clonidine 0.1 mg daily.  But at this point I will hold off on the clonidine. 5.  Generalized weakness.  PT again recommends rehab.  Discontinue pravastatin 6.  Hyperlipidemia unspecified.  Discontinue pravastatin if they are contributing to the weakness. 7.  Hypothyroidism unspecified on levothyroxine 8.  Chronic  respiratory failure on 2 L of oxygen 9.  Previous thrombocytopenia.  This has improved 10.  Daytime sleepiness.  The patient states that she had trouble sleeping over at the rehab.  That may have been with the steroids that we discharged her on.  PRN trazodone at night  DISCHARGE CONDITIONS:   Satisfactory  CONSULTS OBTAINED:  None  DRUG ALLERGIES:   Allergies  Allergen Reactions  . Doxycycline Other (See Comments)    Severe upset stomach and pain     DISCHARGE MEDICATIONS:   Allergies as of 05/17/2017      Reactions   Doxycycline Other (See Comments)   Severe upset stomach and pain       Medication List    STOP taking these medications   benzonatate 100 MG capsule Commonly known as:  TESSALON   cloNIDine 0.2 MG tablet Commonly known as:  CATAPRES   cyclobenzaprine 5 MG tablet Commonly known as:  FLEXERIL   furosemide 20 MG tablet Commonly known as:  LASIX   pravastatin 40 MG tablet Commonly known as:  PRAVACHOL   traMADol-acetaminophen 37.5-325 MG tablet Commonly known as:  ULTRACET     TAKE these medications   acetaminophen 650 MG CR tablet Commonly known as:  TYLENOL Take 650 mg by mouth every 8 (eight) hours as needed for pain.   albuterol 108 (90 Base) MCG/ACT inhaler Commonly known as:  PROVENTIL HFA;VENTOLIN HFA Inhale 2 puffs into the lungs every 6 (six) hours as  needed for wheezing or shortness of breath.   amLODipine 5 MG tablet Commonly known as:  NORVASC Take 5 mg by mouth daily.   aspirin 81 MG tablet Take 81 mg by mouth daily.   cholecalciferol 1000 units tablet Commonly known as:  VITAMIN D Take 1,000 Units by mouth daily.   guaiFENesin 100 MG/5ML Soln Commonly known as:  ROBITUSSIN Take 5 mLs (100 mg total) by mouth every 4 (four) hours as needed for cough or to loosen phlegm.   isosorbide dinitrate 20 MG tablet Commonly known as:  ISORDIL Take 20 mg by mouth 3 (three) times daily.   levofloxacin 750 MG tablet Commonly known  as:  LEVAQUIN Take 1 tablet (750 mg total) by mouth every other day. Start taking on:  05/19/2017   levothyroxine 150 MCG tablet Commonly known as:  SYNTHROID, LEVOTHROID Take 150 mcg by mouth daily.   lisinopril 20 MG tablet Commonly known as:  PRINIVIL,ZESTRIL Take 20 mg by mouth daily.   metoprolol tartrate 50 MG tablet Commonly known as:  LOPRESSOR Take 1 tablet (50 mg total) by mouth 2 (two) times daily. What changed:    medication strength  how much to take   nystatin 100000 UNIT/ML suspension Commonly known as:  MYCOSTATIN Take 5 mLs by mouth 4 (four) times daily.   ondansetron 4 MG disintegrating tablet Commonly known as:  ZOFRAN-ODT Take 4 mg by mouth every 8 (eight) hours as needed for nausea.   Psyllium 58.12 % Pack Take 1 packet by mouth daily as needed. For laxative   traZODone 50 MG tablet Commonly known as:  DESYREL Take 1 tablet (50 mg total) by mouth at bedtime as needed for sleep.   venlafaxine 75 MG tablet Commonly known as:  EFFEXOR Take 75 mg by mouth daily with breakfast.        DISCHARGE INSTRUCTIONS:   Follow-up with team at rehab  If you experience worsening of your admission symptoms, develop shortness of breath, life threatening emergency, suicidal or homicidal thoughts you must seek medical attention immediately by calling 911 or calling your MD immediately  if symptoms less severe.  You Must read complete instructions/literature along with all the possible adverse reactions/side effects for all the Medicines you take and that have been prescribed to you. Take any new Medicines after you have completely understood and accept all the possible adverse reactions/side effects.   Please note  You were cared for by a hospitalist during your hospital stay. If you have any questions about your discharge medications or the care you received while you were in the hospital after you are discharged, you can call the unit and asked to speak with the  hospitalist on call if the hospitalist that took care of you is not available. Once you are discharged, your primary care physician will handle any further medical issues. Please note that NO REFILLS for any discharge medications will be authorized once you are discharged, as it is imperative that you return to your primary care physician (or establish a relationship with a primary care physician if you do not have one) for your aftercare needs so that they can reassess your need for medications and monitor your lab values.    Today   CHIEF COMPLAINT:   Chief Complaint  Patient presents with  . Weakness    HISTORY OF PRESENT ILLNESS:  Lisa Haney  is a 84 y.o. female sent back in with altered mental status and weakness   VITAL SIGNS:  Blood  pressure (!) 163/50, pulse 63, temperature 97.8 F (36.6 C), resp. rate 16, height 5\' 5"  (1.651 m), weight 80.6 kg (177 lb 9.6 oz), SpO2 97 %.   PHYSICAL EXAMINATION:  GENERAL:  84 y.o.-year-old patient lying in the bed with no acute distress.  EYES: Pupils equal, round, reactive to light and accommodation. No scleral icterus. Extraocular muscles intact.  HEENT: Head atraumatic, normocephalic. Oropharynx and nasopharynx clear.  NECK:  Supple, no jugular venous distention. No thyroid enlargement, no tenderness.  LUNGS: Normal breath sounds bilaterally, no wheezing, rales,rhonchi or crepitation. No use of accessory muscles of respiration.  CARDIOVASCULAR: S1, S2 normal. No murmurs, rubs, or gallops.  ABDOMEN: Soft, non-tender, non-distended. Bowel sounds present. No organomegaly or mass.  EXTREMITIES: Trace edema, no cyanosis, or clubbing.  NEUROLOGIC: Cranial nerves II through XII are intact. Muscle strength 5/5 in all extremities. Sensation intact. Gait not checked.  PSYCHIATRIC: The patient is alert and oriented x 3.  SKIN: No obvious rash, lesion, or ulcer.   DATA REVIEW:   CBC Recent Labs  Lab 05/16/17 0451  WBC 10.0  HGB 14.5  HCT  43.7  PLT 181    Chemistries  Recent Labs  Lab 05/14/17 1345  05/16/17 0451  NA 134*   < > 133*  K 4.4   < > 4.4  CL 99*   < > 99*  CO2 26   < > 26  GLUCOSE 98   < > 94  BUN 43*   < > 34*  CREATININE 1.26*   < > 1.10*  CALCIUM 8.4*   < > 8.3*  AST 32  --   --   ALT 49  --   --   ALKPHOS 60  --   --   BILITOT 0.8  --   --    < > = values in this interval not displayed.    Cardiac Enzymes Recent Labs  Lab 05/14/17 1345  TROPONINI <0.03    Microbiology Results  Results for orders placed or performed during the hospital encounter of 05/14/17  Blood culture (routine x 2)     Status: None (Preliminary result)   Collection Time: 05/14/17  1:45 PM  Result Value Ref Range Status   Specimen Description BLOOD RAC  Final   Special Requests   Final    BOTTLES DRAWN AEROBIC AND ANAEROBIC Blood Culture results may not be optimal due to an excessive volume of blood received in culture bottles   Culture   Final    NO GROWTH 3 DAYS Performed at [facility], [address] [place], [place] [address]    Report Status PENDING  Incomplete  Urine Culture     Status: Abnormal   Collection Time: 05/14/17  2:20 PM  Result Value Ref Range Status   Specimen Description   Final    URINE, RANDOM Performed at [facility], [address], [place], [place] [address]    Special Requests   Final    NONE Performed at [facility], [address] [place], [place] [address]    Culture >=100,000 COLONIES/mL CITROBACTER FREUNDII (A)  Final   Report Status 05/17/2017 FINAL  Final   Organism ID, Bacteria CITROBACTER FREUNDII (A)  Final      Susceptibility   Citrobacter freundii - MIC*    CEFAZOLIN >=64 RESISTANT Resistant     CEFTRIAXONE >=64 RESISTANT Resistant     CIPROFLOXACIN <=0.25 SENSITIVE Sensitive     GENTAMICIN <=1 SENSITIVE Sensitive  IMIPENEM 1 SENSITIVE Sensitive     NITROFURANTOIN <=16 SENSITIVE Sensitive     TRIMETH/SULFA <=20 SENSITIVE  Sensitive     PIP/TAZO >=128 RESISTANT Resistant     * >=100,000 COLONIES/mL CITROBACTER FREUNDII  Blood culture (routine x 2)     Status: None (Preliminary result)   Collection Time: 05/14/17  3:10 PM  Result Value Ref Range Status   Specimen Description BLOOD RAC  Final   Special Requests   Final    BOTTLES DRAWN AEROBIC AND ANAEROBIC Blood Culture adequate volume   Culture   Final    NO GROWTH 3 DAYS Performed at [facility], [address] [place], [place] [address]    Report Status PENDING  Incomplete  MRSA PCR Screening     Status: None   Collection Time: 05/15/17  1:30 PM  Result Value Ref Range Status   MRSA by PCR NEGATIVE NEGATIVE Final    Comment:        The GeneXpert MRSA Assay (FDA approved for NASAL specimens only), is one component of a comprehensive MRSA colonization surveillance program. It is not intended to diagnose MRSA infection nor to guide or monitor treatment for MRSA infections. Performed at [facility], [address], [place], [place] [address]       Management plans discussed with the patient, family and they are in agreement.  CODE STATUS:     Code Status Orders  (From admission, onward)        Start     Ordered   05/14/17 1928  Full code  Continuous     05/14/17 1927    Code Status History    Date Active Date Inactive Code Status Order ID Comments User Context   05/04/2017 17:55 05/09/2017 15:07 Full Code [id number]  Fritzi Mandes, MD Inpatient   06/21/2016 09:59 06/21/2016 14:13 Full Code [id number]  Isaias Cowman, MD Inpatient   02/13/2016 04:17 02/15/2016 20:21 Full Code [id number]  Harvie Bridge, DO Inpatient    Advance Directive Documentation     Most Recent Value  Type of Advance Directive  Healthcare Power of Attorney  Pre-existing out of facility DNR order (yellow form or pink MOST form)  No data  "MOST" Form in Place?  No data      TOTAL TIME TAKING CARE OF THIS PATIENT: 35 minutes.     Loletha Grayer M.D on 05/17/2017 at 9:10 AM  Between 7am to 6pm - Pager - [phone]  After 6pm go to [email or web address] - password [email or web address] [facility]  [phone]  CC: Primary care physician; Glendon Axe, MD

## 2017-05-17 NOTE — Progress Notes (Signed)
ANTIBIOTIC CONSULT NOTE - INITIAL  Pharmacy Consult for levofloxacin Indication: UTI/possible PNA  Allergies  Allergen Reactions  . Doxycycline Other (See Comments)    Severe upset stomach and pain     Patient Measurements: Height: 5\' 5"  (165.1 cm) Weight: 177 lb 9.6 oz (80.6 kg) IBW/kg (Calculated) : 57 Adjusted Body Weight:   Vital Signs: Temp: 97.8 F (36.6 C) (01/18 0458) Temp Source: Oral (01/17 2034) BP: 163/50 (01/18 0458) Pulse Rate: 63 (01/18 0458) Intake/Output from previous day: 01/17 0701 - 01/18 0700 In: 580 [P.O.:480; IV Piggyback:100] Out: -  Intake/Output from this shift: No intake/output data recorded.  Labs: Recent Labs    05/14/17 1345 05/14/17 2125 05/15/17 0413 05/16/17 0451  WBC 13.2*  --  11.2* 10.0  HGB 14.1  --  14.5 14.5  PLT 218  --  190 181  CREATININE 1.26* 1.28* 1.11* 1.10*   Estimated Creatinine Clearance: 37.1 mL/min (A) (by C-G formula based on SCr of 1.1 mg/dL (H)). No results for input(s): VANCOTROUGH, VANCOPEAK, VANCORANDOM, GENTTROUGH, GENTPEAK, GENTRANDOM, TOBRATROUGH, TOBRAPEAK, TOBRARND, AMIKACINPEAK, AMIKACINTROU, AMIKACIN in the last 72 hours.   Microbiology: Recent Results (from the past 720 hour(s))  Culture, blood (routine x 2)     Status: None   Collection Time: 05/04/17  4:16 PM  Result Value Ref Range Status   Specimen Description BLOOD  Final   Special Requests NONE  Final   Culture   Final    NO GROWTH 5 DAYS Performed at [facility], [address] [place], [place] [address]    Report Status 05/09/2017 FINAL  Final  Culture, blood (routine x 2)     Status: None   Collection Time: 05/04/17  4:44 PM  Result Value Ref Range Status   Specimen Description BLOOD RIGHT ARM  Final   Special Requests   Final    BOTTLES DRAWN AEROBIC AND ANAEROBIC Blood Culture adequate volume   Culture   Final    NO GROWTH 5 DAYS Performed at [facility], [address] [place], [place] [address]    Report Status 05/09/2017 FINAL  Final  Culture, expectorated sputum-assessment     Status: None   Collection Time: 05/05/17  1:13 PM  Result Value Ref Range Status   Specimen Description SPU  Final   Special Requests Normal  Final   Sputum evaluation   Final    Sputum specimen not acceptable for testing.  Please recollect.   SPOKE TO ANNA [place] 05/05/17 @ [place] Performed at [facility], [address] [place], [place] [address]    Report Status 05/05/2017 FINAL  Final  MRSA PCR Screening     Status: None   Collection Time: 05/05/17  3:15 PM  Result Value Ref Range Status   MRSA by PCR NEGATIVE NEGATIVE Final    Comment:        The GeneXpert MRSA Assay (FDA approved for NASAL specimens only), is one component of a comprehensive MRSA colonization surveillance program. It is not intended to diagnose MRSA infection nor to guide or monitor treatment for MRSA infections. Performed at [facility], [address] [place], [place] [address]   Blood culture (routine x 2)     Status: None (Preliminary result)   Collection Time: 05/14/17  1:45 PM  Result Value Ref Range Status   Specimen Description BLOOD RAC  Final   Special Requests   Final    BOTTLES DRAWN AEROBIC AND ANAEROBIC Blood Culture results may  not be optimal due to an excessive volume of blood received in culture bottles   Culture   Final    NO GROWTH 3 DAYS Performed at [facility], [address] [place], [place] [address]    Report Status PENDING  Incomplete  Urine Culture     Status: Abnormal   Collection Time: 05/14/17  2:20 PM  Result Value Ref Range Status   Specimen Description   Final    URINE, RANDOM Performed at [facility], [address], [place], [place] [address]    Special Requests   Final    NONE Performed at [facility], [address] [place], [place] [address]    Culture >=100,000 COLONIES/mL CITROBACTER FREUNDII (A)  Final    Report Status 05/17/2017 FINAL  Final   Organism ID, Bacteria CITROBACTER FREUNDII (A)  Final      Susceptibility   Citrobacter freundii - MIC*    CEFAZOLIN >=64 RESISTANT Resistant     CEFTRIAXONE >=64 RESISTANT Resistant     CIPROFLOXACIN <=0.25 SENSITIVE Sensitive     GENTAMICIN <=1 SENSITIVE Sensitive     IMIPENEM 1 SENSITIVE Sensitive     NITROFURANTOIN <=16 SENSITIVE Sensitive     TRIMETH/SULFA <=20 SENSITIVE Sensitive     PIP/TAZO >=128 RESISTANT Resistant     * >=100,000 COLONIES/mL CITROBACTER FREUNDII  Blood culture (routine x 2)     Status: None (Preliminary result)   Collection Time: 05/14/17  3:10 PM  Result Value Ref Range Status   Specimen Description BLOOD RAC  Final   Special Requests   Final    BOTTLES DRAWN AEROBIC AND ANAEROBIC Blood Culture adequate volume   Culture   Final    NO GROWTH 3 DAYS Performed at [facility], [address] [place], [place] [address]    Report Status PENDING  Incomplete  MRSA PCR Screening     Status: None   Collection Time: 05/15/17  1:30 PM  Result Value Ref Range Status   MRSA by PCR NEGATIVE NEGATIVE Final    Comment:        The GeneXpert MRSA Assay (FDA approved for NASAL specimens only), is one component of a comprehensive MRSA colonization surveillance program. It is not intended to diagnose MRSA infection nor to guide or monitor treatment for MRSA infections. Performed at [facility], [address] [place], [place] [address]     Medical History: Past Medical History:  Diagnosis Date  . Arthritis   . Cancer ([place])    skin cancer on hand  . COPD (chronic obstructive pulmonary disease) ([place])   . Coronary artery disease    stent  . Diverticulitis   . Hypertension   . Stroke [facility])    TIA  . TIA (transient ischemic attack)     Medications:  Infusions:   Assessment: 55 yof with citrobacter UTI sensitive to FQ and recent admissions/question of PNA (although MRSA PCR negative, PCT  negative). Pharmacy consulted to dose levofloxacin for UTI and requested higher dose for possible PNA.  Goal of Therapy:  Resolve infection Prevent ADE  Plan:  Levofloxacin 750 mg po Q48H - would recommend 5 to 7 day course.  Laural Benes, Pharm.D., BCPS Clinical Pharmacist 05/17/2017,8:31 AM

## 2017-05-19 LAB — CULTURE, BLOOD (ROUTINE X 2)
CULTURE: NO GROWTH
Culture: NO GROWTH
SPECIAL REQUESTS: ADEQUATE

## 2017-06-07 ENCOUNTER — Emergency Department: Payer: Medicare Other

## 2017-06-07 ENCOUNTER — Encounter: Payer: Self-pay | Admitting: Emergency Medicine

## 2017-06-07 ENCOUNTER — Emergency Department
Admission: EM | Admit: 2017-06-07 | Discharge: 2017-06-07 | Disposition: A | Discharge status: Home / Self Care | Payer: Medicare Other | Attending: Emergency Medicine | Admitting: Emergency Medicine

## 2017-06-07 DIAGNOSIS — I119 Hypertensive heart disease without heart failure: Secondary | ICD-10-CM | POA: Insufficient documentation

## 2017-06-07 DIAGNOSIS — R4182 Altered mental status, unspecified: Secondary | ICD-10-CM | POA: Diagnosis present

## 2017-06-07 DIAGNOSIS — M6281 Muscle weakness (generalized): Secondary | ICD-10-CM | POA: Insufficient documentation

## 2017-06-07 DIAGNOSIS — Z79899 Other long term (current) drug therapy: Secondary | ICD-10-CM | POA: Insufficient documentation

## 2017-06-07 DIAGNOSIS — I2510 Atherosclerotic heart disease of native coronary artery without angina pectoris: Secondary | ICD-10-CM | POA: Diagnosis not present

## 2017-06-07 DIAGNOSIS — R531 Weakness: Secondary | ICD-10-CM

## 2017-06-07 DIAGNOSIS — J449 Chronic obstructive pulmonary disease, unspecified: Secondary | ICD-10-CM | POA: Diagnosis not present

## 2017-06-07 DIAGNOSIS — D649 Anemia, unspecified: Secondary | ICD-10-CM | POA: Diagnosis not present

## 2017-06-07 LAB — URINALYSIS, COMPLETE (UACMP) WITH MICROSCOPIC
BILIRUBIN URINE: NEGATIVE
Bacteria, UA: NONE SEEN
GLUCOSE, UA: NEGATIVE mg/dL
HGB URINE DIPSTICK: NEGATIVE
Ketones, ur: NEGATIVE mg/dL
Leukocytes, UA: NEGATIVE
NITRITE: NEGATIVE
Protein, ur: NEGATIVE mg/dL
SPECIFIC GRAVITY, URINE: 1.01 (ref 1.005–1.030)
pH: 6 (ref 5.0–8.0)

## 2017-06-07 LAB — BASIC METABOLIC PANEL
ANION GAP: 10 (ref 5–15)
BUN: 32 mg/dL — ABNORMAL HIGH (ref 6–20)
CALCIUM: 8.9 mg/dL (ref 8.9–10.3)
CO2: 23 mmol/L (ref 22–32)
CREATININE: 1.17 mg/dL — AB (ref 0.44–1.00)
Chloride: 103 mmol/L (ref 101–111)
GFR, EST AFRICAN AMERICAN: 47 mL/min — AB (ref >60)
GFR, EST NON AFRICAN AMERICAN: 40 mL/min — AB (ref >60)
Glucose, Bld: 88 mg/dL (ref 65–99)
Potassium: 4.8 mmol/L (ref 3.5–5.1)
Sodium: 136 mmol/L (ref 135–145)

## 2017-06-07 LAB — CBC
HCT: 34.6 % — ABNORMAL LOW (ref 35.0–47.0)
HEMOGLOBIN: 11.5 g/dL — AB (ref 12.0–16.0)
MCH: 29.8 pg (ref 26.0–34.0)
MCHC: 33.1 g/dL (ref 32.0–36.0)
MCV: 89.8 fL (ref 80.0–100.0)
PLATELETS: 241 10*3/uL (ref 150–440)
RBC: 3.85 MIL/uL (ref 3.80–5.20)
RDW: 16.9 % — ABNORMAL HIGH (ref 11.5–14.5)
WBC: 8 10*3/uL (ref 3.6–11.0)

## 2017-06-07 LAB — TROPONIN I: Troponin I: <0.03 ng/mL (ref <0.03)

## 2017-06-07 NOTE — Discharge Instructions (Signed)
Please seek medical attention for any high fevers, chest pain, shortness of breath, change in behavior, persistent vomiting, bloody stool or any other new or concerning symptoms.  

## 2017-06-07 NOTE — ED Triage Notes (Signed)
PT arrived via ems from home where she lives with her son. Son reported his mother "began to stare then shake." Pt has no recollection of episode. Upon arrival to ED pt alert and oriented x 4. Pt denies any pain. Pt does report weakness and poor appetite.

## 2017-06-07 NOTE — ED Provider Notes (Signed)
[facility] Emergency Department Provider Note  ____________________________________________   I have reviewed the triage vital signs and the nursing notes.   HISTORY  Chief Complaint Weakness   History primarily obtained from son   HPI Lisa Haney is a 84 y.o. female who presents to the emergency department today via EMS because of concern for episode of decreased responsiveness. The son states that he was at his house with his mother. Had briefly left the room and when he returned she was sitting in her chair staring straight ahead. She was also holding her arms up stiffly. He states that he tried to get her to respond which she did not do. He thinks this episode lasted for a few minutes. Family states that patient has had somewhat similar symptoms two times in the past but neither have lasted as long. The patient has recently returned home from rehab. She had been doing well until today. No fevers.   Per medical record review patient has a history of recent admission for cystitis, dehydration.  Past Medical History:  Diagnosis Date  . Arthritis   . Cancer ([place])    skin cancer on hand  . COPD (chronic obstructive pulmonary disease) ([place])   . Coronary artery disease    stent  . Diverticulitis   . Hypertension   . Stroke [facility])    TIA  . TIA (transient ischemic attack)     Patient Active Problem List   Diagnosis Date Noted  . Pneumonia 05/04/2017  . COPD exacerbation ([place]) 02/13/2016    Past Surgical History:  Procedure Laterality Date  . ABDOMINAL HYSTERECTOMY    . CAROTID STENT    . CHOLECYSTECTOMY    . DILATION AND CURETTAGE OF UTERUS    . NASAL SINUS SURGERY    . OVARIAN CYST REMOVAL    . REPLACEMENT TOTAL KNEE Right   . TONSILLECTOMY      Prior to Admission medications   Medication Sig Start Date End Date Taking? Authorizing Provider  acetaminophen (TYLENOL) 650 MG CR tablet Take 650 mg by mouth every 8 (eight) hours as needed  for pain.    [provider]  albuterol (PROVENTIL HFA;VENTOLIN HFA) 108 (90 Base) MCG/ACT inhaler Inhale 2 puffs into the lungs every 6 (six) hours as needed for wheezing or shortness of breath. 04/28/17   Rudene Re, MD  amLODipine (NORVASC) 5 MG tablet Take 5 mg by mouth daily.    [provider]  aspirin 81 MG tablet Take 81 mg by mouth daily.    [provider]  cholecalciferol (VITAMIN D) 1000 units tablet Take 1,000 Units by mouth daily.    [provider]  guaiFENesin (ROBITUSSIN) 100 MG/5ML SOLN Take 5 mLs (100 mg total) by mouth every 4 (four) hours as needed for cough or to loosen phlegm. 05/09/17   Bettey Costa, MD  isosorbide dinitrate (ISORDIL) 20 MG tablet Take 20 mg by mouth 3 (three) times daily.    [provider]  levofloxacin (LEVAQUIN) 750 MG tablet Take 1 tablet (750 mg total) by mouth every other day. 05/19/17   Loletha Grayer, MD  levothyroxine (SYNTHROID, LEVOTHROID) 150 MCG tablet Take 150 mcg by mouth daily.     [provider]  lisinopril (PRINIVIL,ZESTRIL) 20 MG tablet Take 20 mg by mouth daily.    [provider]  metoprolol tartrate (LOPRESSOR) 50 MG tablet Take 1 tablet (50 mg total) by mouth 2 (two) times daily. 05/17/17   Loletha Grayer, MD  nystatin (MYCOSTATIN) 100000 UNIT/ML suspension Take 5 mLs by mouth 4 (four) times daily.    [provider]  ondansetron (ZOFRAN-ODT) 4 MG disintegrating tablet Take 4 mg by mouth every 8 (eight) hours as needed for nausea.    [provider]  Psyllium 58.12 % PACK Take 1 packet by mouth daily as needed. For laxative    [provider]  traZODone (DESYREL) 50 MG tablet Take 1 tablet (50 mg total) by mouth at bedtime as needed for sleep. 05/17/17   Loletha Grayer, MD  venlafaxine (EFFEXOR) 75 MG tablet Take 75 mg by mouth daily with breakfast.    [provider]    Allergies Doxycycline  No family history on  file.  Social History Social History   Tobacco Use  . Smoking status: Never Smoker  . Smokeless tobacco: Never Used  Substance Use Topics  . Alcohol use: No  . Drug use: No    Review of Systems Constitutional: No fever/chills Eyes: No visual changes. ENT: No sore throat. Cardiovascular: Denies chest pain. Respiratory: Denies shortness of breath. Gastrointestinal: No abdominal pain.  No nausea, no vomiting.  No diarrhea.   Genitourinary: Negative for dysuria. Musculoskeletal: Negative for back pain. Skin: Negative for rash. Neurological: Positive for headache.  ____________________________________________   PHYSICAL EXAM:  VITAL SIGNS: ED Triage Vitals  Enc Vitals Group     BP 06/07/17 1302 (!) 139/56     Pulse Rate 06/07/17 1302 62     Resp 06/07/17 1302 19     Temp 06/07/17 1302 97.6 F (36.4 C)     Temp Source 06/07/17 1302 Oral     SpO2 06/07/17 1302 94 %     Weight 06/07/17 1303 177 lb (80.3 kg)     Height 06/07/17 1303 5\' 5"  (1.651 m)     Head Circumference --      Peak Flow --      Pain Score 06/07/17 1302 0   Constitutional: Alert and oriented. Well appearing and in no distress. Eyes: Conjunctivae are normal.  ENT   Head: Normocephalic and atraumatic.   Nose: No congestion/rhinnorhea.   Mouth/Throat: Mucous membranes are moist.   Neck: No stridor. Hematological/Lymphatic/Immunilogical: No cervical lymphadenopathy. Cardiovascular: Normal rate, regular rhythm.  No murmurs, rubs, or gallops.  Respiratory: Normal respiratory effort without tachypnea nor retractions. Breath sounds are clear and equal bilaterally. No wheezes/rales/rhonchi. Gastrointestinal: Soft and non tender. No rebound. No guarding.  Genitourinary: Deferred Musculoskeletal: Normal range of motion in all extremities. No lower extremity edema. Neurologic:  Normal speech and language. No gross focal neurologic deficits are appreciated.  Skin:  Skin is warm, dry and intact. No  rash noted. Psychiatric: Mood and affect are normal. Speech and behavior are normal. Patient exhibits appropriate insight and judgment.  ____________________________________________    LABS (pertinent positives/negatives)  UA not consistent with infection CBC wbc 8.0, hgb 11.5, plt 241 BMP cr 1.17, k 4.8 Trop <0.03  ____________________________________________   EKG  I, Nance Pear, attending physician, personally viewed and interpreted this EKG  EKG Time: 1309 Rate: 66 Rhythm: normal sinus rhythm Axis: normal Intervals: qtc 428 QRS: narrow ST changes: no st elevation Impression: normal ekg   ____________________________________________    RADIOLOGY  CT head No acute findings, some chronic disease  ____________________________________________   PROCEDURES  Procedures  ____________________________________________   INITIAL IMPRESSION / ASSESSMENT AND PLAN / ED COURSE  Pertinent labs & imaging results that were available during my care of the patient were reviewed by  me and considered in my medical decision making (see chart for details).  Patient presented to the emergency department today because of concerns for episode of decreased responsiveness.  Differential would be broad.  I would have concern for possible infection, intracranial bleed or mass ultralight abnormality most of the radial patient's workup here without any obvious cause of the patient's symptoms.  This has happened a couple of times.  She was found to be slightly anemic and this is decreased from her baseline.  Rectal exam was performed without any obvious blood and guaiac was negative.  This point feel patient is safe for discharge home.  Did discuss importance of follow-up with primary care physician.   ____________________________________________   FINAL CLINICAL IMPRESSION(S) / ED DIAGNOSES  Final diagnoses:  Anemia, unspecified type  Weakness     Note: This dictation was  prepared with Dragon dictation. Any transcriptional errors that result from this process are unintentional     Nance Pear, MD 06/07/17 [phone]

## 2017-06-07 NOTE — ED Notes (Signed)
Pt given water 

## 2017-06-27 ENCOUNTER — Other Ambulatory Visit
Admission: RE | Admit: 2017-06-27 | Discharge: 2017-06-27 | Disposition: A | Discharge status: Home / Self Care | Payer: Medicare Other | Source: Ambulatory Visit | Attending: Family Medicine | Admitting: Family Medicine

## 2017-06-27 DIAGNOSIS — I5032 Chronic diastolic (congestive) heart failure: Secondary | ICD-10-CM | POA: Diagnosis present

## 2017-06-27 LAB — BRAIN NATRIURETIC PEPTIDE: B NATRIURETIC PEPTIDE 5: 328 pg/mL — AB (ref 0.0–100.0)

## 2018-06-10 ENCOUNTER — Emergency Department: Payer: Medicare Other

## 2018-06-10 ENCOUNTER — Observation Stay
Admission: EM | Admit: 2018-06-10 | Discharge: 2018-06-12 | Disposition: A | Discharge status: Home / Self Care | Payer: Medicare Other | Attending: Specialist | Admitting: Specialist

## 2018-06-10 ENCOUNTER — Other Ambulatory Visit: Payer: Self-pay

## 2018-06-10 ENCOUNTER — Encounter: Payer: Self-pay | Admitting: Emergency Medicine

## 2018-06-10 DIAGNOSIS — Z79899 Other long term (current) drug therapy: Secondary | ICD-10-CM | POA: Diagnosis not present

## 2018-06-10 DIAGNOSIS — E039 Hypothyroidism, unspecified: Secondary | ICD-10-CM | POA: Diagnosis not present

## 2018-06-10 DIAGNOSIS — I2510 Atherosclerotic heart disease of native coronary artery without angina pectoris: Secondary | ICD-10-CM | POA: Insufficient documentation

## 2018-06-10 DIAGNOSIS — I129 Hypertensive chronic kidney disease with stage 1 through stage 4 chronic kidney disease, or unspecified chronic kidney disease: Secondary | ICD-10-CM | POA: Diagnosis not present

## 2018-06-10 DIAGNOSIS — Z7982 Long term (current) use of aspirin: Secondary | ICD-10-CM | POA: Diagnosis not present

## 2018-06-10 DIAGNOSIS — J441 Chronic obstructive pulmonary disease with (acute) exacerbation: Secondary | ICD-10-CM | POA: Diagnosis not present

## 2018-06-10 DIAGNOSIS — Z8673 Personal history of transient ischemic attack (TIA), and cerebral infarction without residual deficits: Secondary | ICD-10-CM | POA: Insufficient documentation

## 2018-06-10 DIAGNOSIS — I160 Hypertensive urgency: Principal | ICD-10-CM | POA: Diagnosis present

## 2018-06-10 DIAGNOSIS — F329 Major depressive disorder, single episode, unspecified: Secondary | ICD-10-CM | POA: Insufficient documentation

## 2018-06-10 DIAGNOSIS — I272 Pulmonary hypertension, unspecified: Secondary | ICD-10-CM | POA: Insufficient documentation

## 2018-06-10 DIAGNOSIS — N183 Chronic kidney disease, stage 3 (moderate): Secondary | ICD-10-CM | POA: Insufficient documentation

## 2018-06-10 DIAGNOSIS — R079 Chest pain, unspecified: Secondary | ICD-10-CM | POA: Diagnosis present

## 2018-06-10 DIAGNOSIS — E785 Hyperlipidemia, unspecified: Secondary | ICD-10-CM | POA: Insufficient documentation

## 2018-06-10 DIAGNOSIS — Z79890 Hormone replacement therapy (postmenopausal): Secondary | ICD-10-CM | POA: Insufficient documentation

## 2018-06-10 DIAGNOSIS — R1011 Right upper quadrant pain: Secondary | ICD-10-CM | POA: Insufficient documentation

## 2018-06-10 DIAGNOSIS — Z85828 Personal history of other malignant neoplasm of skin: Secondary | ICD-10-CM | POA: Diagnosis not present

## 2018-06-10 DIAGNOSIS — R1012 Left upper quadrant pain: Secondary | ICD-10-CM | POA: Diagnosis not present

## 2018-06-10 LAB — BASIC METABOLIC PANEL
ANION GAP: 7 (ref 5–15)
BUN: 31 mg/dL — ABNORMAL HIGH (ref 8–23)
CHLORIDE: 101 mmol/L (ref 98–111)
CO2: 27 mmol/L (ref 22–32)
Calcium: 9 mg/dL (ref 8.9–10.3)
Creatinine, Ser: 1.18 mg/dL — ABNORMAL HIGH (ref 0.44–1.00)
GFR calc non Af Amer: 41 mL/min — ABNORMAL LOW (ref >60)
GFR, EST AFRICAN AMERICAN: 47 mL/min — AB (ref >60)
Glucose, Bld: 111 mg/dL — ABNORMAL HIGH (ref 70–99)
POTASSIUM: 4.4 mmol/L (ref 3.5–5.1)
Sodium: 135 mmol/L (ref 135–145)

## 2018-06-10 LAB — CBC
HEMATOCRIT: 40.6 % (ref 36.0–46.0)
HEMOGLOBIN: 13.1 g/dL (ref 12.0–15.0)
MCH: 29.4 pg (ref 26.0–34.0)
MCHC: 32.3 g/dL (ref 30.0–36.0)
MCV: 91 fL (ref 80.0–100.0)
NRBC: 0 % (ref 0.0–0.2)
PLATELETS: 169 10*3/uL (ref 150–400)
RBC: 4.46 MIL/uL (ref 3.87–5.11)
RDW: 13 % (ref 11.5–15.5)
WBC: 7.7 10*3/uL (ref 4.0–10.5)

## 2018-06-10 LAB — TROPONIN I: Troponin I: <0.03 ng/mL (ref <0.03)

## 2018-06-10 MED ORDER — IOHEXOL 300 MG/ML  SOLN
75.0000 mL | Freq: Once | INTRAMUSCULAR | Status: AC | PRN
  Administered 2018-06-10: 75 mL via INTRAVENOUS

## 2018-06-10 MED ORDER — METOPROLOL TARTRATE 5 MG/5ML IV SOLN
5.0000 mg | Freq: Once | INTRAVENOUS | Status: AC
Start: 2018-06-10 — End: 2018-06-11
  Administered 2018-06-11: 5 mg via INTRAVENOUS
  Filled 2018-06-10: qty 5

## 2018-06-10 NOTE — ED Triage Notes (Signed)
Pt to ED via EMS from home c/o chest pain that started yesterday and hasn't gotten better, denies new SOB, states congestion and cough nonproductive.  Pt took 2 baby ASA at home and 2 extra strength tylenol today.  Pt is non radiating tight pain, denies n/v/d.  EMS vitals 236/91 BP, 90 HR, 100% 2L [place] chronically, and PVCs on 12 lead.  Presents A&Ox4, speaking in complete and coherent sentences, chest rise even and unlabored and in NAD at this time.

## 2018-06-11 DIAGNOSIS — I160 Hypertensive urgency: Secondary | ICD-10-CM | POA: Diagnosis not present

## 2018-06-11 LAB — TROPONIN I
Troponin I: <0.03 ng/mL (ref <0.03)
Troponin I: <0.03 ng/mL (ref <0.03)
Troponin I: <0.03 ng/mL (ref <0.03)

## 2018-06-11 LAB — FIBRIN DERIVATIVES D-DIMER (ARMC ONLY): Fibrin derivatives D-dimer (ARMC): 1355.71 ng/mL (FEU) — ABNORMAL HIGH (ref 0.00–499.00)

## 2018-06-11 LAB — TSH: TSH: 0.097 u[IU]/mL — ABNORMAL LOW (ref 0.350–4.500)

## 2018-06-11 MED ORDER — MELATONIN 5 MG PO TABS
5.0000 mg | ORAL_TABLET | Freq: Every day | ORAL | Status: DC
  Administered 2018-06-11: 5 mg via ORAL
  Filled 2018-06-11 (×3): qty 1

## 2018-06-11 MED ORDER — FUROSEMIDE 40 MG PO TABS
120.0000 mg | ORAL_TABLET | Freq: Every day | ORAL | Status: DC
  Administered 2018-06-11 – 2018-06-12 (×2): 120 mg via ORAL
  Filled 2018-06-11 (×2): qty 3

## 2018-06-11 MED ORDER — LABETALOL HCL 5 MG/ML IV SOLN
10.0000 mg | INTRAVENOUS | Status: AC
  Administered 2018-06-11: 10 mg via INTRAVENOUS
  Filled 2018-06-11: qty 4

## 2018-06-11 MED ORDER — ACETAMINOPHEN 325 MG PO TABS
650.0000 mg | ORAL_TABLET | Freq: Four times a day (QID) | ORAL | Status: DC | PRN
  Administered 2018-06-11 (×2): 650 mg via ORAL
  Filled 2018-06-11 (×2): qty 2

## 2018-06-11 MED ORDER — LEVOTHYROXINE SODIUM 100 MCG PO TABS
100.0000 ug | ORAL_TABLET | Freq: Every day | ORAL | Status: DC
  Administered 2018-06-11 – 2018-06-12 (×2): 100 ug via ORAL
  Filled 2018-06-11 (×2): qty 1

## 2018-06-11 MED ORDER — ACETAMINOPHEN 650 MG RE SUPP: 650.0000 mg | Freq: Four times a day (QID) | RECTAL | Status: DC | PRN

## 2018-06-11 MED ORDER — LORATADINE 10 MG PO TABS
10.0000 mg | ORAL_TABLET | Freq: Every day | ORAL | Status: DC
  Administered 2018-06-11 – 2018-06-12 (×2): 10 mg via ORAL
  Filled 2018-06-11 (×2): qty 1

## 2018-06-11 MED ORDER — ONDANSETRON HCL 4 MG PO TABS
4.0000 mg | ORAL_TABLET | Freq: Four times a day (QID) | ORAL | Status: DC | PRN
  Administered 2018-06-12: 4 mg via ORAL
  Filled 2018-06-11: qty 1

## 2018-06-11 MED ORDER — SILDENAFIL CITRATE 20 MG PO TABS
60.0000 mg | ORAL_TABLET | Freq: Three times a day (TID) | ORAL | Status: DC
  Administered 2018-06-11 – 2018-06-12 (×4): 60 mg via ORAL
  Filled 2018-06-11 (×6): qty 3

## 2018-06-11 MED ORDER — ALUM & MAG HYDROXIDE-SIMETH 200-200-20 MG/5ML PO SUSP: 30.0000 mL | Freq: Four times a day (QID) | ORAL | Status: DC | PRN

## 2018-06-11 MED ORDER — HEPARIN SODIUM (PORCINE) 5000 UNIT/ML IJ SOLN
5000.0000 [IU] | Freq: Three times a day (TID) | INTRAMUSCULAR | Status: DC
  Administered 2018-06-11 – 2018-06-12 (×4): 5000 [IU] via SUBCUTANEOUS
  Filled 2018-06-11 (×4): qty 1

## 2018-06-11 MED ORDER — SODIUM CHLORIDE 0.9 % IV SOLN
INTRAVENOUS | Status: AC
  Administered 2018-06-11 – 2018-06-12 (×2): via INTRAVENOUS

## 2018-06-11 MED ORDER — HYDRALAZINE HCL 20 MG/ML IJ SOLN: 10.0000 mg | Freq: Four times a day (QID) | INTRAMUSCULAR | Status: DC | PRN

## 2018-06-11 MED ORDER — DOCUSATE SODIUM 100 MG PO CAPS
100.0000 mg | ORAL_CAPSULE | Freq: Two times a day (BID) | ORAL | Status: DC
  Administered 2018-06-11 – 2018-06-12 (×3): 100 mg via ORAL
  Filled 2018-06-11 (×3): qty 1

## 2018-06-11 MED ORDER — LISINOPRIL 20 MG PO TABS
40.0000 mg | ORAL_TABLET | Freq: Every day | ORAL | Status: DC
  Administered 2018-06-11 – 2018-06-12 (×2): 40 mg via ORAL
  Filled 2018-06-11 (×3): qty 2

## 2018-06-11 MED ORDER — PRAVASTATIN SODIUM 40 MG PO TABS
40.0000 mg | ORAL_TABLET | Freq: Every day | ORAL | Status: DC
  Administered 2018-06-11: 40 mg via ORAL
  Filled 2018-06-11: qty 1

## 2018-06-11 MED ORDER — VENLAFAXINE HCL ER 75 MG PO CP24
225.0000 mg | ORAL_CAPSULE | Freq: Every day | ORAL | Status: DC
  Administered 2018-06-11 – 2018-06-12 (×2): 225 mg via ORAL
  Filled 2018-06-11 (×3): qty 3

## 2018-06-11 MED ORDER — NITROGLYCERIN 2 % TD OINT
0.5000 [in_us] | TOPICAL_OINTMENT | Freq: Once | TRANSDERMAL | Status: AC
  Administered 2018-06-11: 0.5 [in_us] via TOPICAL
  Filled 2018-06-11: qty 1

## 2018-06-11 MED ORDER — ASPIRIN EC 81 MG PO TBEC
81.0000 mg | DELAYED_RELEASE_TABLET | Freq: Every day | ORAL | Status: DC
  Administered 2018-06-11 – 2018-06-12 (×2): 81 mg via ORAL
  Filled 2018-06-11 (×2): qty 1

## 2018-06-11 MED ORDER — CLONIDINE HCL 0.1 MG PO TABS
0.1000 mg | ORAL_TABLET | Freq: Once | ORAL | Status: AC
  Administered 2018-06-11: 0.1 mg via ORAL
  Filled 2018-06-11: qty 1

## 2018-06-11 MED ORDER — ONDANSETRON HCL 4 MG/2ML IJ SOLN: 4.0000 mg | Freq: Four times a day (QID) | INTRAMUSCULAR | Status: DC | PRN

## 2018-06-11 MED ORDER — AMLODIPINE BESYLATE 10 MG PO TABS
10.0000 mg | ORAL_TABLET | Freq: Every day | ORAL | Status: DC
  Administered 2018-06-12: 10 mg via ORAL
  Filled 2018-06-11: qty 1

## 2018-06-11 MED ORDER — VITAMIN D3 25 MCG (1000 UNIT) PO TABS
1000.0000 [IU] | ORAL_TABLET | Freq: Every day | ORAL | Status: DC
  Administered 2018-06-11 – 2018-06-12 (×2): 1000 [IU] via ORAL
  Filled 2018-06-11 (×2): qty 1

## 2018-06-11 MED ORDER — AMLODIPINE BESYLATE 5 MG PO TABS
2.5000 mg | ORAL_TABLET | Freq: Every day | ORAL | Status: DC
  Administered 2018-06-11: 2.5 mg via ORAL
  Filled 2018-06-11 (×2): qty 1

## 2018-06-11 NOTE — Progress Notes (Signed)
[facility] [place] NAME: Lisa Haney    MR#:  [id number]  [place]:  [birth year]  SUBJECTIVE:   Patient admitted to the hospital due to chest/abdominal pain and noted to have uncontrolled HTN.  Denies any nausea vomiting and tolerating p.o. well.  CT abdomen pelvis shows no acute pathology.  Troponins just mildly elevated.  Patient is not hypoxic.  REVIEW OF SYSTEMS:    Review of Systems  Constitutional: Negative for chills and fever.  HENT: Negative for congestion and tinnitus.   Eyes: Negative for blurred vision and double vision.  Respiratory: Negative for cough, shortness of breath and wheezing.   Cardiovascular: Negative for chest pain, orthopnea and PND.  Gastrointestinal: Positive for abdominal pain. Negative for diarrhea, nausea and vomiting.  Genitourinary: Negative for dysuria and hematuria.  Neurological: Negative for dizziness, sensory change and focal weakness.  All other systems reviewed and are negative.   Nutrition: Heart Healthy Tolerating Diet: Yes Tolerating PT: Await Eval.    DRUG ALLERGIES:   Allergies  Allergen Reactions  . Doxycycline Other (See Comments)    Severe upset stomach and pain     VITALS:  Blood pressure (!) 169/56, pulse 73, temperature 97.8 F (36.6 C), temperature source Oral, resp. rate 16, height 5\' 1"  (1.549 m), weight 99.1 kg, SpO2 92 %.  PHYSICAL EXAMINATION:   Physical Exam  GENERAL:  85 y.o.-year-old patient lying in bed in no acute distress.  EYES: Pupils equal, round, reactive to light and accommodation. No scleral icterus. Extraocular muscles intact.  HEENT: Head atraumatic, normocephalic. Oropharynx and nasopharynx clear.  NECK:  Supple, no jugular venous distention. No thyroid enlargement, no tenderness.  LUNGS: Normal breath sounds bilaterally, no wheezing, rales, rhonchi. No use of accessory muscles of respiration.  CARDIOVASCULAR: S1, S2 normal. No murmurs, rubs,  or gallops.  ABDOMEN: Soft, nontender, nondistended. Bowel sounds present. No organomegaly or mass.  EXTREMITIES: No cyanosis, clubbing or edema b/l.    NEUROLOGIC: Cranial nerves II through XII are intact. No focal Motor or sensory deficits b/l.   PSYCHIATRIC: The patient is alert and oriented x 3.  SKIN: No obvious rash, lesion, or ulcer.    LABORATORY PANEL:   CBC Recent Labs  Lab 06/10/18 2252  WBC 7.7  HGB 13.1  HCT 40.6  PLT 169   ------------------------------------------------------------------------------------------------------------------  Chemistries  Recent Labs  Lab 06/10/18 2252  NA 135  K 4.4  CL 101  CO2 27  GLUCOSE 111*  BUN 31*  CREATININE 1.18*  CALCIUM 9.0   ------------------------------------------------------------------------------------------------------------------  Cardiac Enzymes Recent Labs  Lab 06/11/18 0959  TROPONINI <0.03   ------------------------------------------------------------------------------------------------------------------  RADIOLOGY:  Dg Chest 2 View  Result Date: 06/10/2018 CLINICAL DATA:  Chest pain EXAM: CHEST - 2 VIEW COMPARISON:  05/14/2017 FINDINGS: Heart is upper limits normal in size. Diffuse aortic calcifications. No confluent airspace opacity, effusion or edema. No acute bony abnormality. IMPRESSION: Borderline heart size.  Aortic atherosclerosis.  No active disease. Electronically Signed   By: Rolm Baptise M.D.   On: 06/10/2018 23:13   Ct Abdomen Pelvis W Contrast  Result Date: 06/11/2018 CLINICAL DATA:  Chest and abdominal pain EXAM: CT ABDOMEN AND PELVIS WITH CONTRAST TECHNIQUE: Multidetector CT imaging of the abdomen and pelvis was performed using the standard protocol following bolus administration of intravenous contrast. CONTRAST:  38mL OMNIPAQUE IOHEXOL 300 MG/ML  SOLN COMPARISON:  10/19/2015 FINDINGS: Lower chest: No acute abnormality. Hepatobiliary: No focal liver abnormality is  seen. Status post  cholecystectomy. No biliary dilatation. Pancreas: Unremarkable. No pancreatic ductal dilatation or surrounding inflammatory changes. Spleen: Normal in size without focal abnormality. Adrenals/Urinary Tract: Adrenal glands are within normal limits. Kidneys are well visualized bilaterally. No renal calculi or obstructive changes are noted. The bladder is well distended. Stomach/Bowel: Diverticular change of the colon is noted without evidence of diverticulitis. No obstructive or inflammatory changes of the bowel are seen. The appendix is not visualized. No inflammatory changes to suggest appendicitis are seen. Stomach and small bowel are unremarkable. Vascular/Lymphatic: Aortic atherosclerosis. No enlarged abdominal or pelvic lymph nodes. Reproductive: Status post hysterectomy. No adnexal masses. Other: No abdominal wall hernia or abnormality. No abdominopelvic ascites. Musculoskeletal: Degenerative changes of lumbar spine are noted. IMPRESSION: Diverticulosis without evidence of diverticulitis. No acute abnormality noted. Electronically Signed   By: Inez Catalina M.D.   On: 06/11/2018 00:19     ASSESSMENT AND PLAN:   85 year old female with past medical history of previous TIA, hypertension, coronary disease, COPD, osteoarthritis who presented to the hospital due to abdominal/chest pain.  1.  Accelerated hypertension- patient systolic blood pressures on admission were greater than 180. - Continue Norvasc but I will increase the dose, continue senna pill.  We will add some as needed IV hydralazine.  Follow hemodynamics.    2.  Chest pain-patient symptoms are very atypical.  She does have significant cardiac risk factors though. - Cardiac markers have not trended up.  Observe on telemetry, will await CT chest with contrast to rule out pulmonary embolism.  Patient has a history of pulmonary hypertension.  Cannot do CT today as patient had CT abdomen pelvis with contrast yesterday.  3.  Abdominal  pain-etiology unclear.  CT abdomen pelvis is negative for acute pathology. -Possibly to gastritis/GERD.  Will place on some Maalox as needed.  4.  Hypothyroidism-continue Synthroid.  5.  Pulmonary hypertension-continue sildenafil.  6.  Hyperlipidemia-continue Pravachol.  7.  Depression-continue Effexor.     All the records are reviewed and case discussed with Care Management/Social Worker. Management plans discussed with the patient, family and they are in agreement.  CODE STATUS: Full code  DVT Prophylaxis: Hep SQ  TOTAL TIME TAKING CARE OF THIS PATIENT: 30 minutes.   POSSIBLE D/C IN 1-2 DAYS, DEPENDING ON CLINICAL CONDITION.   Henreitta Leber M.D on 06/11/2018 at 1:41 PM  Between 7am to 6pm - Pager - [phone]  After 6pm go to [email or web address] - Proofreader  [facility]  Office  [phone]  CC: Primary care physician; Glendon Axe, MD

## 2018-06-11 NOTE — Care Management Note (Signed)
Case Management Note  Patient Details  Name: LEONE MOBLEY MRN: [id number] Date of Birth: 1926-11-05  Subjective/Objective:        Patient is from home with son.  Placed in observation for HTN; chest pain.  She uses home oxygen prn and a CPAP at night.  She obtains her medications at [facility] in [place]. Current with PCP.  She does not drive or work.  Her son transports her to appointments as needed.  Denies difficulties obtaining medications or accessing medical care.  Offered HH services at discharge; patient declines at this time.  Will continue to assist with DC planning and progression of patient.               Action/Plan:   Expected Discharge Date:                  Expected Discharge Plan:  Home/Self Care  In-House Referral:     Discharge planning Services  CM Consult  Post Acute Care Choice:    Choice offered to:     DME Arranged:    DME Agency:     HH Arranged:    HH Agency:     Status of Service:  In process, will continue to follow  If discussed at Long Length of Stay Meetings, dates discussed:    Additional Comments:  Elza Rafter, RN 06/11/2018, 4:00 PM

## 2018-06-11 NOTE — H&P (Signed)
Lisa Haney is an 85 y.o. female.   Chief Complaint: Chest pain HPI: The patient with past medical history of coronary artery disease status post PCI, hypertension and COPD presents to the emergency department complaining of chest pain.  Her pain began more than 12 hours prior to presentation but she became concerned that it did not improve.  The pain was in the center of her chest.  The patient admits to some upper abdominal pain as well.  She took aspirin without relief.  She denies shortness of breath, nausea, vomiting or diaphoresis.  She was found to have a systolic blood pressure rater than 200.  Nitropaste was placed on the patient's chest.  She was given clonidine and labetalol prior to the emergency department staff, hospitalist service for admission.  Past Medical History:  Diagnosis Date  . Arthritis   . Cancer ([place])    skin cancer on hand  . COPD (chronic obstructive pulmonary disease) ([place])   . Coronary artery disease    stent  . Diverticulitis   . Hypertension   . Stroke [facility])    TIA  . TIA (transient ischemic attack)     Past Surgical History:  Procedure Laterality Date  . ABDOMINAL HYSTERECTOMY    . CAROTID STENT    . CHOLECYSTECTOMY    . DILATION AND CURETTAGE OF UTERUS    . NASAL SINUS SURGERY    . OVARIAN CYST REMOVAL    . REPLACEMENT TOTAL KNEE Right   . TONSILLECTOMY      History reviewed. No pertinent family history. Social History:  reports that she has never smoked. She has never used smokeless tobacco. She reports that she does not drink alcohol or use drugs.  Allergies:  Allergies  Allergen Reactions  . Doxycycline Other (See Comments)    Severe upset stomach and pain     Medications Prior to Admission  Medication Sig Dispense Refill  . amLODipine (NORVASC) 2.5 MG tablet Take 2.5 mg by mouth daily.    Marland Kitchen aspirin 81 MG tablet Take 81 mg by mouth daily.    . cholecalciferol (VITAMIN D) 1000 units tablet Take 1,000 Units by mouth daily.    .  furosemide (LASIX) 40 MG tablet Take 120 mg by mouth daily.     Marland Kitchen levothyroxine (SYNTHROID, LEVOTHROID) 100 MCG tablet Take 100 mcg by mouth daily before breakfast.     . lisinopril (PRINIVIL,ZESTRIL) 40 MG tablet Take 40 mg by mouth daily.     Marland Kitchen loratadine (CLARITIN) 10 MG tablet Take 10 mg by mouth daily at 2 PM.     . Melatonin 5 MG TABS Take 5 mg by mouth daily at 8 pm.     . pravastatin (PRAVACHOL) 40 MG tablet Take 40 mg by mouth daily at 8 pm.     . sildenafil (REVATIO) 20 MG tablet Take 60 mg by mouth 3 (three) times daily.    . Venlafaxine HCl 225 MG TB24 Take 225 mg by mouth daily.      Results for orders placed or performed during the hospital encounter of 06/10/18 (from the past 48 hour(s))  Basic metabolic panel     Status: Abnormal   Collection Time: 06/10/18 10:52 PM  Result Value Ref Range   Sodium 135 135 - 145 mmol/L   Potassium 4.4 3.5 - 5.1 mmol/L   Chloride 101 98 - 111 mmol/L   CO2 27 22 - 32 mmol/L   Glucose, Bld 111 (H) 70 - 99 mg/dL  BUN 31 (H) 8 - 23 mg/dL   Creatinine, Ser 1.18 (H) 0.44 - 1.00 mg/dL   Calcium 9.0 8.9 - 10.3 mg/dL   GFR calc non Af Amer 41 (L) >60 mL/min   GFR calc Af Amer 47 (L) >60 mL/min   Anion gap 7 5 - 15    Comment: Performed at [facility], [address] [place], [place] [address]  CBC     Status: None   Collection Time: 06/10/18 10:52 PM  Result Value Ref Range   WBC 7.7 4.0 - 10.5 K/uL   RBC 4.46 3.87 - 5.11 MIL/uL   Hemoglobin 13.1 12.0 - 15.0 g/dL   HCT 40.6 36.0 - 46.0 %   MCV 91.0 80.0 - 100.0 fL   MCH 29.4 26.0 - 34.0 pg   MCHC 32.3 30.0 - 36.0 g/dL   RDW 13.0 11.5 - 15.5 %   Platelets 169 150 - 400 K/uL   nRBC 0.0 0.0 - 0.2 %    Comment: Performed at [facility], [address] [place], [place] [address]  Troponin I - ONCE - STAT     Status: None   Collection Time: 06/10/18 10:52 PM  Result Value Ref Range   Troponin I <0.03 <0.03 ng/mL    Comment: Performed at [facility], [address] [place], [place] [address]  Troponin I - ONCE - STAT     Status: None   Collection Time: 06/11/18  1:57 AM  Result Value Ref Range   Troponin I <0.03 <0.03 ng/mL    Comment: Performed at [facility], [address] [place], [place] [address]  Fibrin derivatives D-Dimer     Status: Abnormal   Collection Time: 06/11/18  1:57 AM  Result Value Ref Range   Fibrin derivatives D-dimer ([facility]) 1,355.71 (H) 0.00 - 499.00 ng/mL (FEU)    Comment: (NOTE) <> Exclusion of Venous Thromboembolism (VTE) - OUTPATIENT ONLY   (Emergency Department or [place])   0-499 ng/ml (FEU): With a low to intermediate pretest probability                      for VTE this test result excludes the diagnosis                      of VTE.   >499 ng/ml (FEU) : VTE not excluded; additional work up for VTE is                      required. <> Testing on Inpatients and Evaluation of Disseminated Intravascular   Coagulation (DIC) Reference Range:   0-499 ng/ml (FEU) Performed at [facility], [address] [place], [place] [address]   TSH     Status: Abnormal   Collection Time: 06/11/18  4:18 AM  Result Value Ref Range   TSH 0.097 (L) 0.350 - 4.500 uIU/mL    Comment: Performed by a 3rd Generation assay with a functional sensitivity of <=0.01 uIU/mL. Performed at [facility], [address] [place], [place] [address]   Troponin I - Now Then Q6H     Status: None   Collection Time: 06/11/18  4:18 AM  Result Value Ref Range   Troponin I <0.03 <0.03 ng/mL    Comment: Performed at [facility], [address] [place],  [address]   Dg Chest 2 View  Result Date: 06/10/2018 CLINICAL DATA:  Chest pain EXAM: CHEST -  2 VIEW COMPARISON:  05/14/2017 FINDINGS: Heart is upper limits normal in size. Diffuse aortic calcifications. No confluent airspace opacity, effusion or edema. No acute bony abnormality. IMPRESSION: Borderline heart size.  Aortic atherosclerosis.  No  active disease. Electronically Signed   By: Rolm Baptise M.D.   On: 06/10/2018 23:13   Ct Abdomen Pelvis W Contrast  Result Date: 06/11/2018 CLINICAL DATA:  Chest and abdominal pain EXAM: CT ABDOMEN AND PELVIS WITH CONTRAST TECHNIQUE: Multidetector CT imaging of the abdomen and pelvis was performed using the standard protocol following bolus administration of intravenous contrast. CONTRAST:  61mL OMNIPAQUE IOHEXOL 300 MG/ML  SOLN COMPARISON:  10/19/2015 FINDINGS: Lower chest: No acute abnormality. Hepatobiliary: No focal liver abnormality is seen. Status post cholecystectomy. No biliary dilatation. Pancreas: Unremarkable. No pancreatic ductal dilatation or surrounding inflammatory changes. Spleen: Normal in size without focal abnormality. Adrenals/Urinary Tract: Adrenal glands are within normal limits. Kidneys are well visualized bilaterally. No renal calculi or obstructive changes are noted. The bladder is well distended. Stomach/Bowel: Diverticular change of the colon is noted without evidence of diverticulitis. No obstructive or inflammatory changes of the bowel are seen. The appendix is not visualized. No inflammatory changes to suggest appendicitis are seen. Stomach and small bowel are unremarkable. Vascular/Lymphatic: Aortic atherosclerosis. No enlarged abdominal or pelvic lymph nodes. Reproductive: Status post hysterectomy. No adnexal masses. Other: No abdominal wall hernia or abnormality. No abdominopelvic ascites. Musculoskeletal: Degenerative changes of lumbar spine are noted. IMPRESSION: Diverticulosis without evidence of diverticulitis. No acute abnormality noted. Electronically Signed   By: Inez Catalina M.D.   On: 06/11/2018 00:19    Review of Systems  Constitutional: Negative for chills and fever.  HENT: Negative for sore throat and tinnitus.   Eyes: Negative for blurred vision and redness.  Respiratory: Negative for cough and shortness of breath.   Cardiovascular: Positive for chest pain.  Negative for palpitations, orthopnea and PND.  Gastrointestinal: Negative for abdominal pain, diarrhea, nausea and vomiting.  Genitourinary: Negative for dysuria, frequency and urgency.  Musculoskeletal: Negative for joint pain and myalgias.  Skin: Negative for rash.       No lesions  Neurological: Negative for speech change, focal weakness and weakness.  Endo/Heme/Allergies: Does not bruise/bleed easily.       No temperature intolerance  Psychiatric/Behavioral: Negative for depression and suicidal ideas.    Blood pressure (!) 169/56, pulse 73, temperature 97.8 F (36.6 C), temperature source Oral, resp. rate 19, height 5\' 1"  (1.549 m), weight 99.1 kg, SpO2 92 %. Physical Exam  Vitals reviewed. Constitutional: She is oriented to person, place, and time. She appears well-developed and well-nourished. No distress.  HENT:  Head: Normocephalic and atraumatic.  Mouth/Throat: Oropharynx is clear and moist.  Eyes: Pupils are equal, round, and reactive to light. Conjunctivae and EOM are normal. No scleral icterus.  Neck: Normal range of motion. Neck supple. No JVD present. No tracheal deviation present. No thyromegaly present.  Cardiovascular: Normal rate, regular rhythm and normal heart sounds. Exam reveals no gallop and no friction rub.  No murmur heard. Respiratory: Effort normal and breath sounds normal.  GI: Soft. Bowel sounds are normal. She exhibits no distension. There is no abdominal tenderness.  Genitourinary:    Genitourinary Comments: Deferred   Musculoskeletal: Normal range of motion.        General: No edema.  Lymphadenopathy:    She has no cervical adenopathy.  Neurological: She is alert and oriented to person, place, and time. No cranial nerve deficit. She exhibits normal muscle  tone.  Skin: Skin is warm and dry. No rash noted. No erythema.  Psychiatric: She has a normal mood and affect. Her behavior is normal. Judgment and thought content normal.      Assessment/Plan This is an 85 year old female admitted for hypertensive urgency. 1.  Hypertensive urgency: Elevated blood pressure causing ongoing chest pain.  Troponin has been negative x2.  The patient does not have any EKG ischemic changes.  Following antihypertensive medication patient's test discomfort has improved.  It is still not completely gone.  Continue to monitor telemetry.  Also continue amlodipine and lisinopril.  The patient also apparently has pulmonary hypertension as she is on sildenafil.  Continue per home regimen. 2.  CAD: Stable; continue aspirin 3.  CKD: Stage III; avoid nephrotoxic agents. 4.  Hypothyroidism: Check TSH; continue Synthroid 5.  COPD: Stable; albuterol as needed. 6.  Hyperlipidemia: Continue statin therapy 7.  DVT prophylaxis: Heparin 8.  GI prophylaxis: None The patient is a full code.  Time spent on admission orders and patient care approximately 45 minutes  Harrie Foreman, MD 06/11/2018, 8:10 AM

## 2018-06-11 NOTE — Plan of Care (Signed)
  Problem: Education: Goal: Knowledge of General Education information will improve Description Including pain rating scale, medication(s)/side effects and non-pharmacologic comfort measures Outcome: Progressing   Problem: Health Behavior/Discharge Planning: Goal: Ability to manage health-related needs will improve Outcome: Progressing   Problem: Clinical Measurements: Goal: Ability to maintain clinical measurements within normal limits will improve Outcome: Progressing Goal: Will remain free from infection Outcome: Progressing Note:  Remains afebrile Goal: Diagnostic test results will improve Outcome: Progressing Note:  Potassium 4.4, BUN 31/1.18, troponins negative Goal: Cardiovascular complication will be avoided Outcome: Progressing Note:  No arrhthymias over night    Problem: Cardiac: Goal: Ability to achieve and maintain adequate cardiovascular perfusion will improve Outcome: Progressing Note:  No arrhythmias over night, no complaints of chest pain

## 2018-06-11 NOTE — ED Notes (Signed)
CT angio is going to be postponed due to low kidney function per Dr. Owens Shark.

## 2018-06-11 NOTE — Care Management Obs Status (Signed)
[facility] NOTIFICATION   Patient Details  Name: Lisa Haney MRN: [id number] Date of Birth: 05-Oct-1926   Medicare Observation Status Notification Given:  Yes    Elza Rafter, RN 06/11/2018, 3:19 PM

## 2018-06-11 NOTE — Progress Notes (Signed)
Notify Dr. Verdell Carmine about patient's uses cpap at home, order given. RN will continue to monitor.

## 2018-06-11 NOTE — ED Provider Notes (Signed)
[facility] Emergency Department Provider Note    First MD Initiated Contact with Patient 06/10/18 2318     (approximate)  I have reviewed the triage vital signs and the nursing notes.   HISTORY  Chief Complaint Chest Pain    HPI Lisa Haney is a 85 y.o. female with below list of chronic medical conditions presents to the emergency department with upper abdominal discomfort which patient states is not gotten better.  Patient told her son chest pain however patient points to the right upper quadrant epigastric and left upper quadrant as the location of her pain.  Patient took 2 baby aspirin's at home with 2 extra strength Tylenol today without any improvement.  Patient describes the pain as "a tight pain.  Patient denies any nausea vomiting diarrhea.  Patient denies any dyspnea.  Patient denies any fever.  Patient states current pain score 6 out of 10.   Past Medical History:  Diagnosis Date  . Arthritis   . Cancer ([place])    skin cancer on hand  . COPD (chronic obstructive pulmonary disease) ([place])   . Coronary artery disease    stent  . Diverticulitis   . Hypertension   . Stroke [facility])    TIA  . TIA (transient ischemic attack)     Patient Active Problem List   Diagnosis Date Noted  . Pneumonia 05/04/2017  . COPD exacerbation ([place]) 02/13/2016    Past Surgical History:  Procedure Laterality Date  . ABDOMINAL HYSTERECTOMY    . CAROTID STENT    . CHOLECYSTECTOMY    . DILATION AND CURETTAGE OF UTERUS    . NASAL SINUS SURGERY    . OVARIAN CYST REMOVAL    . REPLACEMENT TOTAL KNEE Right   . TONSILLECTOMY      Prior to Admission medications   Medication Sig Start Date End Date Taking? Authorizing Provider  amLODipine (NORVASC) 2.5 MG tablet Take 2.5 mg by mouth daily.   Yes [provider]  aspirin 81 MG tablet Take 81 mg by mouth daily.   Yes [provider]  cholecalciferol (VITAMIN D) 1000 units tablet Take 1,000 Units by  mouth daily.   Yes [provider]  furosemide (LASIX) 40 MG tablet Take 120 mg by mouth daily.    Yes [provider]  levothyroxine (SYNTHROID, LEVOTHROID) 100 MCG tablet Take 100 mcg by mouth daily before breakfast.    Yes [provider]  lisinopril (PRINIVIL,ZESTRIL) 40 MG tablet Take 40 mg by mouth daily.    Yes [provider]  loratadine (CLARITIN) 10 MG tablet Take 10 mg by mouth daily at 2 PM.    Yes [provider]  Melatonin 5 MG TABS Take 5 mg by mouth daily at 8 pm.    Yes [provider]  pravastatin (PRAVACHOL) 40 MG tablet Take 40 mg by mouth daily at 8 pm.    Yes [provider]  sildenafil (REVATIO) 20 MG tablet Take 60 mg by mouth 3 (three) times daily.   Yes [provider]  Venlafaxine HCl 225 MG TB24 Take 225 mg by mouth daily.   Yes [provider]  albuterol (PROVENTIL HFA;VENTOLIN HFA) 108 (90 Base) MCG/ACT inhaler Inhale 2 puffs into the lungs every 6 (six) hours as needed for wheezing or shortness of breath. Patient not taking: Reported on 06/11/2018 04/28/17   Rudene Re, MD  guaiFENesin (ROBITUSSIN) 100 MG/5ML SOLN Take 5 mLs (100 mg total) by mouth every 4 (  four) hours as needed for cough or to loosen phlegm. Patient not taking: Reported on 06/07/2017 05/09/17   Bettey Costa, MD  metoprolol tartrate (LOPRESSOR) 50 MG tablet Take 1 tablet (50 mg total) by mouth 2 (two) times daily. Patient not taking: Reported on 06/11/2018 05/17/17   Loletha Grayer, MD  traZODone (DESYREL) 50 MG tablet Take 1 tablet (50 mg total) by mouth at bedtime as needed for sleep. Patient not taking: Reported on 06/11/2018 05/17/17   Loletha Grayer, MD    Allergies Doxycycline  History reviewed. No pertinent family history.  Social History Social History   Tobacco Use  . Smoking status: Never Smoker  . Smokeless tobacco: Never Used  Substance Use Topics  . Alcohol use: No  . Drug use: No     Review of Systems Constitutional: No fever/chills Eyes: No visual changes. ENT: No sore throat. Cardiovascular: Denies chest pain. Respiratory: Denies shortness of breath. Gastrointestinal: Positive for abdominal pain.  No nausea, no vomiting.  No diarrhea.  No constipation. Genitourinary: Negative for dysuria. Musculoskeletal: Negative for neck pain.  Negative for back pain. Integumentary: Negative for rash. Neurological: Negative for headaches, focal weakness or numbness.   ____________________________________________   PHYSICAL EXAM:  VITAL SIGNS: ED Triage Vitals  Enc Vitals Group     BP 06/10/18 2243 (!) 228/78     Pulse Rate 06/10/18 2243 93     Resp 06/10/18 2250 15     Temp 06/10/18 2250 97.7 F (36.5 C)     Temp Source 06/10/18 2250 Oral     SpO2 06/10/18 2243 100 %     Weight 06/10/18 2250 81.6 kg (180 lb)     Height 06/10/18 2250 1.549 m (5\' 1" )     Head Circumference --      Peak Flow --      Pain Score 06/10/18 2250 6     Pain Loc --      Pain Edu? --      Excl. in [place]? --     Constitutional: Alert and oriented. Well appearing and in no acute distress. Eyes: Conjunctivae are normal.  Mouth/Throat: Mucous membranes are moist.  Oropharynx non-erythematous. Neck: No stridor.  Cardiovascular: Normal rate, regular rhythm. Good peripheral circulation. Grossly normal heart sounds. Respiratory: Normal respiratory effort.  No retractions. Lungs CTAB. Gastrointestinal: Soft and nontender. No distention.  Musculoskeletal: No lower extremity tenderness nor edema. No gross deformities of extremities. Neurologic:  Normal speech and language. No gross focal neurologic deficits are appreciated.  Skin:  Skin is warm, dry and intact. No rash noted. Psychiatric: Mood and affect are normal. Speech and behavior are normal.  ____________________________________________   LABS (all labs ordered are listed, but only abnormal results are displayed)  Labs Reviewed   BASIC METABOLIC PANEL - Abnormal; Notable for the following components:      Result Value   Glucose, Bld 111 (*)    BUN 31 (*)    Creatinine, Ser 1.18 (*)    GFR calc non Af Amer 41 (*)    GFR calc Af Amer 47 (*)    All other components within normal limits  CBC  TROPONIN I  TROPONIN I  FIBRIN DERIVATIVES D-DIMER ([facility] ONLY)   ____________________________________________  EKG  ED ECG REPORT I, Nicolaus N Markeisha Mancias, the attending physician, personally viewed and interpreted this ECG.   Date: 06/10/2018  EKG Time: 10:50 PM  Rate: 90  Rhythm: Normal sinus rhythm  Axis: Normal  Intervals: Normal   ST&T Change:  None  ____________________________________________  RADIOLOGY I, [facility] Ernst Bowler, personally viewed and evaluated these images (plain radiographs) as part of my medical decision making, as well as reviewing the written report by the radiologist.  ED MD interpretation: Chest x-ray revealed no active disease CT abdomen pelvis revealed diverticulosis no diverticulitis  Official radiology report(s): Dg Chest 2 View  Result Date: 06/10/2018 CLINICAL DATA:  Chest pain EXAM: CHEST - 2 VIEW COMPARISON:  05/14/2017 FINDINGS: Heart is upper limits normal in size. Diffuse aortic calcifications. No confluent airspace opacity, effusion or edema. No acute bony abnormality. IMPRESSION: Borderline heart size.  Aortic atherosclerosis.  No active disease. Electronically Signed   By: Rolm Baptise M.D.   On: 06/10/2018 23:13   Ct Abdomen Pelvis W Contrast  Result Date: 06/11/2018 CLINICAL DATA:  Chest and abdominal pain EXAM: CT ABDOMEN AND PELVIS WITH CONTRAST TECHNIQUE: Multidetector CT imaging of the abdomen and pelvis was performed using the standard protocol following bolus administration of intravenous contrast. CONTRAST:  94mL OMNIPAQUE IOHEXOL 300 MG/ML  SOLN COMPARISON:  10/19/2015 FINDINGS: Lower chest: No acute abnormality. Hepatobiliary: No focal liver abnormality is seen. Status  post cholecystectomy. No biliary dilatation. Pancreas: Unremarkable. No pancreatic ductal dilatation or surrounding inflammatory changes. Spleen: Normal in size without focal abnormality. Adrenals/Urinary Tract: Adrenal glands are within normal limits. Kidneys are well visualized bilaterally. No renal calculi or obstructive changes are noted. The bladder is well distended. Stomach/Bowel: Diverticular change of the colon is noted without evidence of diverticulitis. No obstructive or inflammatory changes of the bowel are seen. The appendix is not visualized. No inflammatory changes to suggest appendicitis are seen. Stomach and small bowel are unremarkable. Vascular/Lymphatic: Aortic atherosclerosis. No enlarged abdominal or pelvic lymph nodes. Reproductive: Status post hysterectomy. No adnexal masses. Other: No abdominal wall hernia or abnormality. No abdominopelvic ascites. Musculoskeletal: Degenerative changes of lumbar spine are noted. IMPRESSION: Diverticulosis without evidence of diverticulitis. No acute abnormality noted. Electronically Signed   By: Inez Catalina M.D.   On: 06/11/2018 00:19     Procedures   ____________________________________________   INITIAL IMPRESSION / ASSESSMENT AND PLAN / ED COURSE  As part of my medical decision making, I reviewed the following data within the electronic MEDICAL RECORD NUMBER  85 year old female presented with above-stated history and physical exam.  Patient noted to also be markedly hypertensive and as such 5 mg IV metoprolol given with minimal improvement in blood pressure and as such clonidine and subsequently Nitropaste applied.  Patient's EKG revealed no evidence of ischemia or infarction, troponin negative.  Laboratory data noted for elevated d-dimer however given patient received a CT scan of the abdomen with contrast cannot repeat the dye load now per radiologist.  Patient with ongoing discomfort as such patient discussed with Dr. Marcille Blanco for hospital  admission for further evaluation and management  ____________________________________________  FINAL CLINICAL IMPRESSION(S) / ED DIAGNOSES  Chest pain Abdominal pain  MEDICATIONS GIVEN DURING THIS VISIT:  Medications  cloNIDine (CATAPRES) tablet 0.1 mg (has no administration in time range)  metoprolol tartrate (LOPRESSOR) injection 5 mg (5 mg Intravenous Given 06/11/18 0020)  iohexol (OMNIPAQUE) 300 MG/ML solution 75 mL (75 mLs Intravenous Contrast Given 06/10/18 2356)     ED Discharge Orders    None       Note:  This document was prepared using Dragon voice recognition software and may include unintentional dictation errors.   Gregor Hams, MD 06/11/18 [phone]

## 2018-06-11 NOTE — Progress Notes (Signed)
Pt refused CPAP for tonight. On 2L [place] sats 96%

## 2018-06-12 ENCOUNTER — Observation Stay: Payer: Medicare Other

## 2018-06-12 ENCOUNTER — Encounter: Payer: Self-pay | Admitting: Radiology

## 2018-06-12 DIAGNOSIS — I160 Hypertensive urgency: Secondary | ICD-10-CM | POA: Diagnosis not present

## 2018-06-12 LAB — BASIC METABOLIC PANEL
Anion gap: 8 (ref 5–15)
BUN: 24 mg/dL — AB (ref 8–23)
CO2: 27 mmol/L (ref 22–32)
Calcium: 8.8 mg/dL — ABNORMAL LOW (ref 8.9–10.3)
Chloride: 99 mmol/L (ref 98–111)
Creatinine, Ser: 1 mg/dL (ref 0.44–1.00)
GFR calc Af Amer: 58 mL/min — ABNORMAL LOW (ref >60)
GFR calc non Af Amer: 50 mL/min — ABNORMAL LOW (ref >60)
Glucose, Bld: 118 mg/dL — ABNORMAL HIGH (ref 70–99)
Potassium: 3.9 mmol/L (ref 3.5–5.1)
Sodium: 134 mmol/L — ABNORMAL LOW (ref 135–145)

## 2018-06-12 MED ORDER — SODIUM CHLORIDE 0.9% FLUSH: 3.0000 mL | INTRAVENOUS | Status: DC | PRN

## 2018-06-12 MED ORDER — IOHEXOL 350 MG/ML SOLN
75.0000 mL | Freq: Once | INTRAVENOUS | Status: AC | PRN
  Administered 2018-06-12: 75 mL via INTRAVENOUS

## 2018-06-12 MED ORDER — SODIUM CHLORIDE 0.9% FLUSH: 3.0000 mL | Freq: Two times a day (BID) | INTRAVENOUS | Status: DC

## 2018-06-12 NOTE — Plan of Care (Signed)
  Problem: Nutrition: Goal: Adequate nutrition will be maintained Outcome: Progressing   Problem: Coping: Goal: Level of anxiety will decrease Outcome: Progressing   Problem: Elimination: Goal: Will not experience complications related to urinary retention Outcome: Progressing   Problem: Pain Managment: Goal: General experience of comfort will improve Outcome: Progressing Note: No complaints of pain this shift   Problem: Safety: Goal: Ability to remain free from injury will improve Outcome: Progressing   Problem: Skin Integrity: Goal: Risk for impaired skin integrity will decrease Outcome: Progressing   

## 2018-06-12 NOTE — Discharge Summary (Signed)
[facility] NAME: Lisa Haney    MR#:  [id number]  DATE OF BIRTH:  07-11-1926  DATE OF ADMISSION:  06/10/2018 ADMITTING PHYSICIAN: Harrie Foreman, MD  DATE OF DISCHARGE: 06/12/2018  PRIMARY CARE PHYSICIAN: Glendon Axe, MD    ADMISSION DIAGNOSIS:  Chest Pain  DISCHARGE DIAGNOSIS:  Active Problems:   Hypertensive urgency   SECONDARY DIAGNOSIS:   Past Medical History:  Diagnosis Date  . Arthritis   . Cancer ([place])    skin cancer on hand  . COPD (chronic obstructive pulmonary disease) ([place])   . Coronary artery disease    stent  . Diverticulitis   . Hypertension   . Stroke [facility])    TIA  . TIA (transient ischemic attack)     HOSPITAL COURSE:   85 year old female with past medical history of previous TIA, hypertension, coronary disease, COPD, osteoarthritis who presented to the hospital due to abdominal/chest pain.  1.  Accelerated hypertension- patient systolic blood pressures on admission were greater than 180. -Patient was given higher dose of Norvasc, lisinopril and given some as needed IV hydralazine.  Patient's blood pressures have improved.  Further changes to her blood pressure medicine can be done as an outpatient.  2.  Chest pain-patient symptoms were very atypical.  She does have significant cardiac risk factors though. -Patient was observed on telemetry, had 3 sets of cardiac markers that were checked which were negative.  Patient also underwent a CT of the chest with contrast showed no evidence of pulmonary embolism or any pneumonia.  She still has some vague chest pain which is likely musculoskeletal.  This can be further followed and worked up as an outpatient.  3.  Abdominal pain-etiology unclear.  CT abdomen pelvis was negative for acute pathology. -Patient is tolerating p.o. well.  Possibly to underlying gastritis/GERD.  Patient was given some Maalox as needed.  Patient to be referred to  gastroenterology as an outpatient by her primary care physician.   4.  Hypothyroidism- pt. Will continue Synthroid.  5.  Pulmonary hypertension- pt. Will continue sildenafil.  6.  Hyperlipidemia- pt. Will continue Pravachol.  7.  Depression- pt. Will continue Effexor.   DISCHARGE CONDITIONS:   Stable.   CONSULTS OBTAINED:    DRUG ALLERGIES:   Allergies  Allergen Reactions  . Doxycycline Other (See Comments)    Severe upset stomach and pain     DISCHARGE MEDICATIONS:   Allergies as of 06/12/2018      Reactions   Doxycycline Other (See Comments)   Severe upset stomach and pain       Medication List    TAKE these medications   amLODipine 2.5 MG tablet Commonly known as:  NORVASC Take 2.5 mg by mouth daily.   aspirin 81 MG tablet Take 81 mg by mouth daily.   cholecalciferol 1000 units tablet Commonly known as:  VITAMIN D Take 1,000 Units by mouth daily.   furosemide 40 MG tablet Commonly known as:  LASIX Take 120 mg by mouth daily.   levothyroxine 100 MCG tablet Commonly known as:  SYNTHROID, LEVOTHROID Take 100 mcg by mouth daily before breakfast.   lisinopril 40 MG tablet Commonly known as:  PRINIVIL,ZESTRIL Take 40 mg by mouth daily.   loratadine 10 MG tablet Commonly known as:  CLARITIN Take 10 mg by mouth daily at 2 PM.   Melatonin 5 MG Tabs Take 5 mg by mouth daily at 8 pm.   pravastatin 40 MG  tablet Commonly known as:  PRAVACHOL Take 40 mg by mouth daily at 8 pm.   sildenafil 20 MG tablet Commonly known as:  REVATIO Take 60 mg by mouth 3 (three) times daily.   Venlafaxine HCl 225 MG Tb24 Take 225 mg by mouth daily.         DISCHARGE INSTRUCTIONS:   DIET:  Cardiac diet  DISCHARGE CONDITION:  Stable  ACTIVITY:  Activity as tolerated  OXYGEN:  Home Oxygen: Yes.     Oxygen Delivery: 2 liters/min via Patient connected to nasal cannula oxygen  DISCHARGE LOCATION:  home   If you experience worsening of your  admission symptoms, develop shortness of breath, life threatening emergency, suicidal or homicidal thoughts you must seek medical attention immediately by calling 911 or calling your MD immediately  if symptoms less severe.  You Must read complete instructions/literature along with all the possible adverse reactions/side effects for all the Medicines you take and that have been prescribed to you. Take any new Medicines after you have completely understood and accpet all the possible adverse reactions/side effects.   Please note  You were cared for by a hospitalist during your hospital stay. If you have any questions about your discharge medications or the care you received while you were in the hospital after you are discharged, you can call the unit and asked to speak with the hospitalist on call if the hospitalist that took care of you is not available. Once you are discharged, your primary care physician will handle any further medical issues. Please note that NO REFILLS for any discharge medications will be authorized once you are discharged, as it is imperative that you return to your primary care physician (or establish a relationship with a primary care physician if you do not have one) for your aftercare needs so that they can reassess your need for medications and monitor your lab values.     Today   No acute chest pain.  Patient complaining of some vague abdominal pain, no nausea or vomiting.  CT chest is negative for pulmonary embolism.  Will discharge home today.  VITAL SIGNS:  Blood pressure (!) 169/79, pulse 86, temperature 98 F (36.7 C), temperature source Oral, resp. rate 18, height 5\' 1"  (1.549 m), weight 96.6 kg, SpO2 95 %.  I/O:    Intake/Output Summary (Last 24 hours) at 06/12/2018 1346 Last data filed at 06/12/2018 1100 Gross per 24 hour  Intake 903.95 ml  Output 4150 ml  Net -3246.05 ml    PHYSICAL EXAMINATION:   GENERAL:  85 y.o.-year-old patient lying in the bed  with no acute distress.  EYES: Pupils equal, round, reactive to light and accommodation. No scleral icterus. Extraocular muscles intact.  HEENT: Head atraumatic, normocephalic. Oropharynx and nasopharynx clear.  NECK:  Supple, no jugular venous distention. No thyroid enlargement, no tenderness.  LUNGS: Normal breath sounds bilaterally, no wheezing, rales,rhonchi. No use of accessory muscles of respiration.  CARDIOVASCULAR: S1, S2 normal. No murmurs, rubs, or gallops.  ABDOMEN: Soft, non-tender, non-distended. Bowel sounds present. No organomegaly or mass.  EXTREMITIES: No pedal edema, cyanosis, or clubbing.  NEUROLOGIC: Cranial nerves II through XII are intact. No focal motor or sensory defecits b/l.  PSYCHIATRIC: The patient is alert and oriented x 3.  SKIN: No obvious rash, lesion, or ulcer.   DATA REVIEW:   CBC Recent Labs  Lab 06/10/18 2252  WBC 7.7  HGB 13.1  HCT 40.6  PLT 169    Chemistries  Recent Labs  Lab 06/12/18 0322  NA 134*  K 3.9  CL 99  CO2 27  GLUCOSE 118*  BUN 24*  CREATININE 1.00  CALCIUM 8.8*    Cardiac Enzymes Recent Labs  Lab 06/11/18 0959  TROPONINI <0.03     RADIOLOGY:  Dg Chest 2 View  Result Date: 06/10/2018 CLINICAL DATA:  Chest pain EXAM: CHEST - 2 VIEW COMPARISON:  05/14/2017 FINDINGS: Heart is upper limits normal in size. Diffuse aortic calcifications. No confluent airspace opacity, effusion or edema. No acute bony abnormality. IMPRESSION: Borderline heart size.  Aortic atherosclerosis.  No active disease. Electronically Signed   By: Rolm Baptise M.D.   On: 06/10/2018 23:13   Ct Angio Chest Pe W And/or Wo Contrast  Result Date: 06/12/2018 CLINICAL DATA:  Chest pain EXAM: CT ANGIOGRAPHY CHEST WITH CONTRAST TECHNIQUE: Multidetector CT imaging of the chest was performed using the standard protocol during bolus administration of intravenous contrast. Multiplanar CT image reconstructions and MIPs were obtained to evaluate the vascular  anatomy. CONTRAST:  38mL OMNIPAQUE IOHEXOL 350 MG/ML SOLN COMPARISON:  Chest x-ray from 2 days ago FINDINGS: Cardiovascular: Satisfactory opacification of the pulmonary arteries to the segmental level. No evidence of pulmonary embolism. Normal heart size. No pericardial effusion. Limited opacification of the aorta. Aortic and coronary atherosclerosis. Mediastinum/Nodes: Negative for adenopathy. Lungs/Pleura: Mild patchy ground-glass density in the bilateral lungs without specific pattern. No Kerley lines, effusion, or pneumothorax. Upper Abdomen: No change from abdominal CT 2 days ago Musculoskeletal: No acute finding. Spondylosis with multi-level ankylosis. Review of the MIP images confirms the above findings. IMPRESSION: 1. Negative for pulmonary embolism. 2. Minimal patchy ground-glass opacity in the lungs, likely inflammatory. Please correlate for infectious symptoms. Electronically Signed   By: Monte Fantasia M.D.   On: 06/12/2018 10:33   Ct Abdomen Pelvis W Contrast  Result Date: 06/11/2018 CLINICAL DATA:  Chest and abdominal pain EXAM: CT ABDOMEN AND PELVIS WITH CONTRAST TECHNIQUE: Multidetector CT imaging of the abdomen and pelvis was performed using the standard protocol following bolus administration of intravenous contrast. CONTRAST:  32mL OMNIPAQUE IOHEXOL 300 MG/ML  SOLN COMPARISON:  10/19/2015 FINDINGS: Lower chest: No acute abnormality. Hepatobiliary: No focal liver abnormality is seen. Status post cholecystectomy. No biliary dilatation. Pancreas: Unremarkable. No pancreatic ductal dilatation or surrounding inflammatory changes. Spleen: Normal in size without focal abnormality. Adrenals/Urinary Tract: Adrenal glands are within normal limits. Kidneys are well visualized bilaterally. No renal calculi or obstructive changes are noted. The bladder is well distended. Stomach/Bowel: Diverticular change of the colon is noted without evidence of diverticulitis. No obstructive or inflammatory changes of  the bowel are seen. The appendix is not visualized. No inflammatory changes to suggest appendicitis are seen. Stomach and small bowel are unremarkable. Vascular/Lymphatic: Aortic atherosclerosis. No enlarged abdominal or pelvic lymph nodes. Reproductive: Status post hysterectomy. No adnexal masses. Other: No abdominal wall hernia or abnormality. No abdominopelvic ascites. Musculoskeletal: Degenerative changes of lumbar spine are noted. IMPRESSION: Diverticulosis without evidence of diverticulitis. No acute abnormality noted. Electronically Signed   By: Inez Catalina M.D.   On: 06/11/2018 00:19      Management plans discussed with the patient, family and they are in agreement.  CODE STATUS:     Code Status Orders  (From admission, onward)         Start     Ordered   06/11/18 0412  Full code  Continuous     06/11/18 0411       TOTAL TIME TAKING CARE OF THIS PATIENT: 48  minutes.    Henreitta Leber M.D on 06/12/2018 at 1:46 PM  Between 7am to 6pm - Pager - [phone]  After 6pm go to [email or web address] - Proofreader  [facility]  Office  [phone]  CC: Primary care physician; Glendon Axe, MD

## 2018-06-12 NOTE — Progress Notes (Signed)
Patient wants to be discharged to a nursing home, says she's too sick to go home - she has chronic dizziness that her PCP hasn't been able to treat, she is unable to walk - although she says she was walking 2 days ago.  She refused home health services yesterday.  I explained that she doesn't qualify for skilled care and her insurance won't pay for it.  She was impatient but decided she should go home and go up to get dressed. Delsa Sale, case management, spoke with her again about home care and is setting her up for home PT, Home health aide, and Home RN with [facility].

## 2018-06-12 NOTE — Care Management Note (Signed)
Case Management Note  Patient Details  Name: Lisa Haney MRN: [id number] Date of Birth: 1926-10-14  Subjective/Objective:    Son Berton Mount [id number] is here to pick up his mother for DC.   Provided Medicare.gov list; made referral to [facility] with [facility] fro RN, PT, aide, sw and OT and accepted.  Patients son would like to be called to set up [facility] services.  Cory with Alvis Lemmings is aware of DC and Clint's phone number.                 Action/Plan:   Expected Discharge Date:  06/12/18               Expected Discharge Plan:  [place]  In-House Referral:     Discharge planning Services  CM Consult  Post Acute Care Choice:  Home Health Choice offered to:  Patient, Adult Children  DME Arranged:    DME Agency:     HH Arranged:  RN, PT, OT, Nurse's Aide, Social Work [employer] Agency:     Status of Service:  In process, will continue to follow  If discussed at Long Length of Stay Meetings, dates discussed:    Additional Comments:  Elza Rafter, RN 06/12/2018, 3:12 PM

## 2018-06-21 ENCOUNTER — Inpatient Hospital Stay
Admission: EM | Admit: 2018-06-21 | Discharge: 2018-06-26 | DRG: 640 | Disposition: A | Discharge status: Skilled Nursing Facility | Payer: Medicare Other | Attending: Internal Medicine | Admitting: Internal Medicine

## 2018-06-21 ENCOUNTER — Emergency Department: Payer: Medicare Other

## 2018-06-21 ENCOUNTER — Other Ambulatory Visit: Payer: Self-pay

## 2018-06-21 DIAGNOSIS — Z96651 Presence of right artificial knee joint: Secondary | ICD-10-CM | POA: Diagnosis present

## 2018-06-21 DIAGNOSIS — I2510 Atherosclerotic heart disease of native coronary artery without angina pectoris: Secondary | ICD-10-CM | POA: Diagnosis present

## 2018-06-21 DIAGNOSIS — Z9049 Acquired absence of other specified parts of digestive tract: Secondary | ICD-10-CM

## 2018-06-21 DIAGNOSIS — R29705 NIHSS score 5: Secondary | ICD-10-CM | POA: Diagnosis present

## 2018-06-21 DIAGNOSIS — Z7982 Long term (current) use of aspirin: Secondary | ICD-10-CM

## 2018-06-21 DIAGNOSIS — Z90710 Acquired absence of both cervix and uterus: Secondary | ICD-10-CM

## 2018-06-21 DIAGNOSIS — G9341 Metabolic encephalopathy: Secondary | ICD-10-CM | POA: Diagnosis present

## 2018-06-21 DIAGNOSIS — F418 Other specified anxiety disorders: Secondary | ICD-10-CM | POA: Diagnosis present

## 2018-06-21 DIAGNOSIS — Z8673 Personal history of transient ischemic attack (TIA), and cerebral infarction without residual deficits: Secondary | ICD-10-CM

## 2018-06-21 DIAGNOSIS — I10 Essential (primary) hypertension: Secondary | ICD-10-CM | POA: Diagnosis present

## 2018-06-21 DIAGNOSIS — I158 Other secondary hypertension: Secondary | ICD-10-CM | POA: Diagnosis present

## 2018-06-21 DIAGNOSIS — Z79890 Hormone replacement therapy (postmenopausal): Secondary | ICD-10-CM

## 2018-06-21 DIAGNOSIS — R4701 Aphasia: Secondary | ICD-10-CM | POA: Diagnosis present

## 2018-06-21 DIAGNOSIS — E871 Hypo-osmolality and hyponatremia: Principal | ICD-10-CM | POA: Diagnosis present

## 2018-06-21 DIAGNOSIS — Z85828 Personal history of other malignant neoplasm of skin: Secondary | ICD-10-CM

## 2018-06-21 DIAGNOSIS — I160 Hypertensive urgency: Secondary | ICD-10-CM | POA: Diagnosis present

## 2018-06-21 DIAGNOSIS — Z79899 Other long term (current) drug therapy: Secondary | ICD-10-CM

## 2018-06-21 DIAGNOSIS — R51 Headache: Secondary | ICD-10-CM | POA: Diagnosis not present

## 2018-06-21 DIAGNOSIS — Z881 Allergy status to other antibiotic agents status: Secondary | ICD-10-CM

## 2018-06-21 DIAGNOSIS — E860 Dehydration: Secondary | ICD-10-CM | POA: Diagnosis present

## 2018-06-21 DIAGNOSIS — I272 Pulmonary hypertension, unspecified: Secondary | ICD-10-CM | POA: Diagnosis present

## 2018-06-21 DIAGNOSIS — I672 Cerebral atherosclerosis: Secondary | ICD-10-CM | POA: Diagnosis present

## 2018-06-21 DIAGNOSIS — J449 Chronic obstructive pulmonary disease, unspecified: Secondary | ICD-10-CM | POA: Diagnosis present

## 2018-06-21 DIAGNOSIS — I639 Cerebral infarction, unspecified: Secondary | ICD-10-CM

## 2018-06-21 LAB — DIFFERENTIAL
Abs Immature Granulocytes: 0.01 10*3/uL (ref 0.00–0.07)
BASOS ABS: 0.1 10*3/uL (ref 0.0–0.1)
Basophils Relative: 1 %
Eosinophils Absolute: 0.1 10*3/uL (ref 0.0–0.5)
Eosinophils Relative: 2 %
Immature Granulocytes: 0 %
LYMPHS PCT: 28 %
Lymphs Abs: 2 10*3/uL (ref 0.7–4.0)
Monocytes Absolute: 0.6 10*3/uL (ref 0.1–1.0)
Monocytes Relative: 8 %
Neutro Abs: 4.4 10*3/uL (ref 1.7–7.7)
Neutrophils Relative %: 61 %

## 2018-06-21 LAB — COMPREHENSIVE METABOLIC PANEL
ALT: 13 U/L (ref 0–44)
AST: 22 U/L (ref 15–41)
Albumin: 3.8 g/dL (ref 3.5–5.0)
Alkaline Phosphatase: 81 U/L (ref 38–126)
Anion gap: 10 (ref 5–15)
BUN: 33 mg/dL — ABNORMAL HIGH (ref 8–23)
CO2: 24 mmol/L (ref 22–32)
Calcium: 9 mg/dL (ref 8.9–10.3)
Chloride: 94 mmol/L — ABNORMAL LOW (ref 98–111)
Creatinine, Ser: 1.26 mg/dL — ABNORMAL HIGH (ref 0.44–1.00)
GFR calc Af Amer: 44 mL/min — ABNORMAL LOW (ref >60)
GFR calc non Af Amer: 38 mL/min — ABNORMAL LOW (ref >60)
Glucose, Bld: 108 mg/dL — ABNORMAL HIGH (ref 70–99)
Potassium: 4.6 mmol/L (ref 3.5–5.1)
SODIUM: 128 mmol/L — AB (ref 135–145)
Total Bilirubin: 0.5 mg/dL (ref 0.3–1.2)
Total Protein: 7.3 g/dL (ref 6.5–8.1)

## 2018-06-21 LAB — PROTIME-INR
INR: 1.01
Prothrombin Time: 13.2 seconds (ref 11.4–15.2)

## 2018-06-21 LAB — GLUCOSE, CAPILLARY: GLUCOSE-CAPILLARY: 109 mg/dL — AB (ref 70–99)

## 2018-06-21 LAB — CBC
HEMATOCRIT: 41.5 % (ref 36.0–46.0)
HEMOGLOBIN: 13.6 g/dL (ref 12.0–15.0)
MCH: 29.5 pg (ref 26.0–34.0)
MCHC: 32.8 g/dL (ref 30.0–36.0)
MCV: 90 fL (ref 80.0–100.0)
Platelets: 195 10*3/uL (ref 150–400)
RBC: 4.61 MIL/uL (ref 3.87–5.11)
RDW: 13.1 % (ref 11.5–15.5)
WBC: 7.1 10*3/uL (ref 4.0–10.5)
nRBC: 0 % (ref 0.0–0.2)

## 2018-06-21 LAB — ETHANOL: Alcohol, Ethyl (B): <10 mg/dL (ref <10)

## 2018-06-21 LAB — APTT: aPTT: 37 seconds — ABNORMAL HIGH (ref 24–36)

## 2018-06-21 MED ORDER — SODIUM CHLORIDE 0.9 % IV BOLUS
1000.0000 mL | Freq: Once | INTRAVENOUS | Status: AC
  Administered 2018-06-21: 1000 mL via INTRAVENOUS

## 2018-06-21 MED ORDER — ASPIRIN 81 MG PO CHEW
324.0000 mg | CHEWABLE_TABLET | Freq: Once | ORAL | Status: AC
  Administered 2018-06-21: 324 mg via ORAL
  Filled 2018-06-21: qty 4

## 2018-06-21 MED ORDER — ACETAMINOPHEN 10 MG/ML IV SOLN
1000.0000 mg | Freq: Four times a day (QID) | INTRAVENOUS | Status: DC
  Administered 2018-06-22: 1000 mg via INTRAVENOUS
  Filled 2018-06-21 (×4): qty 100

## 2018-06-21 NOTE — ED Notes (Signed)
[place] of blood tubes sent to lab. Pt AMS.

## 2018-06-21 NOTE — ED Notes (Signed)
Pt on bedpan. NT assisting pt & aware urine sample needed.

## 2018-06-21 NOTE — ED Notes (Signed)
EDP notified in person of pt's stroke-like symptoms.

## 2018-06-21 NOTE — ED Notes (Signed)
BG 109

## 2018-06-21 NOTE — ED Notes (Signed)
PT requesting water. Pt's face remains asymmetrical and is still AMS so cannot pass swallow screen.

## 2018-06-21 NOTE — ED Provider Notes (Signed)
[facility] Emergency Department Provider Note  ____________________________________________  Time seen: Approximately 10:43 PM  I have reviewed the triage vital signs and the nursing notes.   HISTORY  Chief Complaint Headache  Level 5 Caveat: Portions of the History and Physical including HPI and review of systems are unable to be completely obtained due to patient being a poor historian due to confusion   HPI Lisa Haney is a 85 y.o. female with a history of hypertension, CAD, COPD, stroke who comes to the ED complaining of  generalized headache, severe associated blurry vision.  She reports that symptoms started around lunchtime yesterday.  Her son thinks her symptoms started 2 days ago.  They have been constant since then, worsening, no aggravating or alleviating factors.  Denies focal weakness or paresthesias.  Denies vomiting or head trauma.  No neck pain.  No fevers or chills.  No falls.     Past Medical History:  Diagnosis Date  . Arthritis   . Cancer ([place])    skin cancer on hand  . COPD (chronic obstructive pulmonary disease) ([place])   . Coronary artery disease    stent  . Diverticulitis   . Hypertension   . Stroke [facility])    TIA  . TIA (transient ischemic attack)      Patient Active Problem List   Diagnosis Date Noted  . Hypertensive urgency 06/11/2018  . Pneumonia 05/04/2017  . COPD exacerbation ([place]) 02/13/2016     Past Surgical History:  Procedure Laterality Date  . ABDOMINAL HYSTERECTOMY    . CAROTID STENT    . CHOLECYSTECTOMY    . DILATION AND CURETTAGE OF UTERUS    . NASAL SINUS SURGERY    . OVARIAN CYST REMOVAL    . REPLACEMENT TOTAL KNEE Right   . TONSILLECTOMY       Prior to Admission medications   Medication Sig Start Date End Date Taking? Authorizing Provider  amLODipine (NORVASC) 2.5 MG tablet Take 2.5 mg by mouth daily.    [provider]  aspirin 81 MG tablet Take 81 mg by mouth daily.    [provider]  cholecalciferol (VITAMIN D) 1000 units tablet Take 1,000 Units by mouth daily.    [provider]  furosemide (LASIX) 40 MG tablet Take 120 mg by mouth daily.     [provider]  levothyroxine (SYNTHROID, LEVOTHROID) 100 MCG tablet Take 100 mcg by mouth daily before breakfast.     [provider]  lisinopril (PRINIVIL,ZESTRIL) 40 MG tablet Take 40 mg by mouth daily.     [provider]  loratadine (CLARITIN) 10 MG tablet Take 10 mg by mouth daily at 2 PM.     [provider]  Melatonin 5 MG TABS Take 5 mg by mouth daily at 8 pm.     [provider]  pravastatin (PRAVACHOL) 40 MG tablet Take 40 mg by mouth daily at 8 pm.     [provider]  sildenafil (REVATIO) 20 MG tablet Take 60 mg by mouth 3 (three) times daily.    [provider]  Venlafaxine HCl 225 MG TB24 Take 225 mg by mouth daily.    [provider]     Allergies Doxycycline   History reviewed. No pertinent family history.  Social History Social History   Tobacco Use  . Smoking status: Never Smoker  . Smokeless tobacco: Never Used  Substance Use Topics  . Alcohol use: No  . Drug use: No  Review of Systems  Constitutional:   No fever or chills.  ENT:   No sore throat. No rhinorrhea. Cardiovascular:   No chest pain or syncope. Respiratory:   No dyspnea or cough. Gastrointestinal:   Negative for abdominal pain, vomiting and diarrhea.  Musculoskeletal:   Negative for focal pain or swelling All other systems reviewed and are negative except as documented above in ROS and HPI.  ____________________________________________   PHYSICAL EXAM:  VITAL SIGNS: ED Triage Vitals  Enc Vitals Group     BP 06/21/18 1942 (!) 179/61     Pulse Rate 06/21/18 1942 89     Resp --      Temp 06/21/18 1942 97.6 F (36.4 C)     Temp Source 06/21/18 1942 Oral     SpO2 06/21/18 1942 98 %     Weight 06/21/18 1945 200 lb (90.7 kg)      Height 06/21/18 1945 5\' 3"  (1.6 m)     Head Circumference --      Peak Flow --      Pain Score 06/21/18 1943 8     Pain Loc --      Pain Edu? --      Excl. in [place]? --     Vital signs reviewed, nursing assessments reviewed.   Constitutional:   Alert and oriented to self. Non-toxic appearance. Eyes:   Conjunctivae are normal. EOMI. PERRL. ENT      Head:   Normocephalic and atraumatic.      Nose:   No congestion/rhinnorhea.       Mouth/Throat:   Dry mucous membranes, no pharyngeal erythema. No peritonsillar mass.       Neck:   No meningismus. Full ROM.  No midline tenderness. Hematological/Lymphatic/Immunilogical:   No cervical lymphadenopathy. Cardiovascular:   RRR. Symmetric bilateral radial and DP pulses.  No murmurs. Cap refill less than 2 seconds. Respiratory:   Normal respiratory effort without tachypnea/retractions. Breath sounds are clear and equal bilaterally. No wheezes/rales/rhonchi. Gastrointestinal:   Soft and nontender. Non distended. There is no CVA tenderness.  No rebound, rigidity, or guarding. Genitourinary:   deferred Musculoskeletal:   Normal range of motion in all extremities. No joint effusions.  No lower extremity tenderness.  No edema. Neurologic:   Normal speech, limited language with some evidence of word finding difficulty and naming difficulty.  Patient could not remember her son's name when he arrived to the bedside. Motor grossly intact. Facial asymmetry NIH stroke scale 5 Skin:    Skin is warm, dry and intact. No rash noted.  No petechiae, purpura, or bullae.  ____________________________________________    LABS (pertinent positives/negatives) (all labs ordered are listed, but only abnormal results are displayed) Labs Reviewed  GLUCOSE, CAPILLARY - Abnormal; Notable for the following components:      Result Value   Glucose-Capillary 109 (*)    All other components within normal limits  APTT - Abnormal; Notable for the following components:   aPTT  37 (*)    All other components within normal limits  COMPREHENSIVE METABOLIC PANEL - Abnormal; Notable for the following components:   Sodium 128 (*)    Chloride 94 (*)    Glucose, Bld 108 (*)    BUN 33 (*)    Creatinine, Ser 1.26 (*)    GFR calc non Af Amer 38 (*)    GFR calc Af Amer 44 (*)    All other components within normal limits  ETHANOL  PROTIME-INR  CBC  DIFFERENTIAL  URINE  DRUG SCREEN, QUALITATIVE ([facility] ONLY)  URINALYSIS, COMPLETE (UACMP) WITH MICROSCOPIC   ____________________________________________   EKG  Interpreted by me Sinus rhythm rate of 89, normal axis.  First-degree AV block.  Normal QRS and ST segments.  Isolated T wave inversion in V2 which is nonspecific.  No ischemic changes.  ____________________________________________    RADIOLOGY  Ct Head Wo Contrast  Result Date: 06/21/2018 CLINICAL DATA:  Vision changes, headache EXAM: CT HEAD WITHOUT CONTRAST TECHNIQUE: Contiguous axial images were obtained from the base of the skull through the vertex without intravenous contrast. COMPARISON:  06/07/2016 FINDINGS: Brain: Old right occipital infarct is stable. There is atrophy and chronic small vessel disease changes. No acute intracranial abnormality. Specifically, no hemorrhage, hydrocephalus, mass lesion, acute infarction, or significant intracranial injury. Vascular: No hyperdense vessel or unexpected calcification. Skull: No acute calvarial abnormality. Sinuses/Orbits: No acute findings Other: None IMPRESSION: Old right occipital infarct. Atrophy, chronic microvascular disease. No acute intracranial abnormality. Electronically Signed   By: Rolm Baptise M.D.   On: 06/21/2018 21:45    ____________________________________________   PROCEDURES Procedures  ____________________________________________  DIFFERENTIAL DIAGNOSIS   Acute ischemic stroke, intracranial tumor, intracranial hemorrhage, electrolyte abnormality  CLINICAL IMPRESSION / ASSESSMENT AND  PLAN / ED COURSE  Medications ordered in the ED: Medications  sodium chloride 0.9 % bolus 1,000 mL (1,000 mLs Intravenous New Bag/Given 06/21/18 2251)  acetaminophen (OFIRMEV) IV 1,000 mg (has no administration in time range)  aspirin chewable tablet 324 mg (324 mg Oral Given 06/21/18 2251)    Pertinent labs & imaging results that were available during my care of the patient were reviewed by me and considered in my medical decision making (see chart for details).      Clinical Course as of Jun 22 2339  Sat Jun 21, 2018  2033 Concern for stroke with confusion and aphasia vision change.  Possibly LVL, but last known well was lunchtime yesterday or earlier, outside of window for TPA or endovascular intervention.  Will obtain CT scan and plan to hospitalize for stroke work-up.   [PS]    Clinical Course User Index [PS] Carrie Mew, MD    ----------------------------------------- 11:33 PM on 06/21/2018 -----------------------------------------  CT head unremarkable.  Angio not indicated at this point given that she would not be a candidate for intervention.  Sodium is 128 which may be contributing to her symptoms as well.  Case discussed with hospitalist for further evaluation and management.  Aspirin given.   ____________________________________________   FINAL CLINICAL IMPRESSION(S) / ED DIAGNOSES    Final diagnoses:  Hyponatremia  Acute ischemic stroke [facility])     ED Discharge Orders    None      Portions of this note were generated with dragon dictation software. Dictation errors may occur despite best attempts at proofreading.   Carrie Mew, MD 06/21/18 [phone]

## 2018-06-21 NOTE — ED Notes (Signed)
Pt urinate on self--unable to collect sample as pt unable to notify RN soon enough to place on bedpan. Cannot use purewick at this time as suction not available. NT provided peri care and linens changed.

## 2018-06-21 NOTE — ED Notes (Signed)
EKG completed

## 2018-06-21 NOTE — ED Notes (Signed)
Son at bedside now

## 2018-06-21 NOTE — ED Notes (Signed)
Pupils 2+ in dim lighting; equal round reactive to light.

## 2018-06-21 NOTE — ED Triage Notes (Signed)
Pt in via EMS for vision changes and HA from home. Hist HTN, stent placement, pulm HTN, COPD. BP 180/86; BG 133; 97% RA; 2L O2 by EMS. Pt currently A&Ox1 (self). Pupils pinpoint.

## 2018-06-22 ENCOUNTER — Observation Stay: Payer: Medicare Other

## 2018-06-22 DIAGNOSIS — I10 Essential (primary) hypertension: Secondary | ICD-10-CM | POA: Diagnosis present

## 2018-06-22 DIAGNOSIS — R4701 Aphasia: Secondary | ICD-10-CM | POA: Diagnosis not present

## 2018-06-22 DIAGNOSIS — I2510 Atherosclerotic heart disease of native coronary artery without angina pectoris: Secondary | ICD-10-CM | POA: Diagnosis present

## 2018-06-22 LAB — URINE DRUG SCREEN, QUALITATIVE (ARMC ONLY)
Amphetamines, Ur Screen: NOT DETECTED
BENZODIAZEPINE, UR SCRN: NOT DETECTED
Barbiturates, Ur Screen: NOT DETECTED
Cannabinoid 50 Ng, Ur ~~LOC~~: NOT DETECTED
Cocaine Metabolite,Ur ~~LOC~~: NOT DETECTED
MDMA (Ecstasy)Ur Screen: NOT DETECTED
Methadone Scn, Ur: NOT DETECTED
Opiate, Ur Screen: NOT DETECTED
Phencyclidine (PCP) Ur S: NOT DETECTED
Tricyclic, Ur Screen: NOT DETECTED

## 2018-06-22 LAB — URINALYSIS, COMPLETE (UACMP) WITH MICROSCOPIC
Bilirubin Urine: NEGATIVE
Glucose, UA: NEGATIVE mg/dL
Hgb urine dipstick: NEGATIVE
Ketones, ur: NEGATIVE mg/dL
Leukocytes,Ua: NEGATIVE
Nitrite: NEGATIVE
Protein, ur: NEGATIVE mg/dL
SPECIFIC GRAVITY, URINE: 1.011 (ref 1.005–1.030)
pH: 8 (ref 5.0–8.0)

## 2018-06-22 LAB — CREATININE, SERUM
Creatinine, Ser: 1.07 mg/dL — ABNORMAL HIGH (ref 0.44–1.00)
GFR calc Af Amer: 53 mL/min — ABNORMAL LOW (ref >60)
GFR calc non Af Amer: 46 mL/min — ABNORMAL LOW (ref >60)

## 2018-06-22 LAB — HEMOGLOBIN A1C
Hgb A1c MFr Bld: 5.6 % (ref 4.8–5.6)
Mean Plasma Glucose: 114.02 mg/dL

## 2018-06-22 LAB — LIPID PANEL
Cholesterol: 139 mg/dL (ref 0–200)
HDL: 50 mg/dL (ref >40)
LDL Cholesterol: 73 mg/dL (ref 0–99)
Total CHOL/HDL Ratio: 2.8 RATIO
Triglycerides: 82 mg/dL (ref <150)
VLDL: 16 mg/dL (ref 0–40)

## 2018-06-22 LAB — CBC
HCT: 40.7 % (ref 36.0–46.0)
Hemoglobin: 13.5 g/dL (ref 12.0–15.0)
MCH: 29.9 pg (ref 26.0–34.0)
MCHC: 33.2 g/dL (ref 30.0–36.0)
MCV: 90 fL (ref 80.0–100.0)
NRBC: 0 % (ref 0.0–0.2)
Platelets: 181 10*3/uL (ref 150–400)
RBC: 4.52 MIL/uL (ref 3.87–5.11)
RDW: 13 % (ref 11.5–15.5)
WBC: 7.3 10*3/uL (ref 4.0–10.5)

## 2018-06-22 MED ORDER — ASPIRIN 81 MG PO CHEW
81.0000 mg | CHEWABLE_TABLET | Freq: Every day | ORAL | Status: DC
  Administered 2018-06-22 – 2018-06-26 (×5): 81 mg via ORAL
  Filled 2018-06-22 (×5): qty 1

## 2018-06-22 MED ORDER — PRAVASTATIN SODIUM 20 MG PO TABS
40.0000 mg | ORAL_TABLET | Freq: Every day | ORAL | Status: DC
  Administered 2018-06-22 – 2018-06-25 (×4): 40 mg via ORAL
  Filled 2018-06-22 (×2): qty 2
  Filled 2018-06-22: qty 1
  Filled 2018-06-22 (×2): qty 2

## 2018-06-22 MED ORDER — ACETAMINOPHEN 650 MG RE SUPP
650.0000 mg | Freq: Four times a day (QID) | RECTAL | Status: DC | PRN
  Filled 2018-06-22: qty 1

## 2018-06-22 MED ORDER — LISINOPRIL 20 MG PO TABS
40.0000 mg | ORAL_TABLET | Freq: Every day | ORAL | Status: DC
  Administered 2018-06-22 – 2018-06-26 (×5): 40 mg via ORAL
  Filled 2018-06-22: qty 2
  Filled 2018-06-22: qty 4
  Filled 2018-06-22 (×3): qty 2

## 2018-06-22 MED ORDER — ENOXAPARIN SODIUM 40 MG/0.4ML ~~LOC~~ SOLN
40.0000 mg | SUBCUTANEOUS | Status: DC
  Administered 2018-06-22 – 2018-06-25 (×4): 40 mg via SUBCUTANEOUS
  Filled 2018-06-22 (×4): qty 0.4

## 2018-06-22 MED ORDER — VITAMIN D 25 MCG (1000 UNIT) PO TABS
1000.0000 [IU] | ORAL_TABLET | Freq: Every day | ORAL | Status: DC
  Administered 2018-06-22 – 2018-06-26 (×5): 1000 [IU] via ORAL
  Filled 2018-06-22 (×6): qty 1

## 2018-06-22 MED ORDER — LABETALOL HCL 5 MG/ML IV SOLN
5.0000 mg | INTRAVENOUS | Status: DC | PRN
  Administered 2018-06-22: 10 mg via INTRAVENOUS
  Administered 2018-06-23: 5 mg via INTRAVENOUS
  Filled 2018-06-22 (×2): qty 4

## 2018-06-22 MED ORDER — MELATONIN 5 MG PO TABS
5.0000 mg | ORAL_TABLET | Freq: Every day | ORAL | Status: DC
  Administered 2018-06-23 – 2018-06-25 (×4): 5 mg via ORAL
  Filled 2018-06-22 (×8): qty 1

## 2018-06-22 MED ORDER — SILDENAFIL CITRATE 20 MG PO TABS
60.0000 mg | ORAL_TABLET | Freq: Three times a day (TID) | ORAL | Status: DC
  Administered 2018-06-22 – 2018-06-26 (×11): 60 mg via ORAL
  Filled 2018-06-22 (×17): qty 3

## 2018-06-22 MED ORDER — SENNOSIDES-DOCUSATE SODIUM 8.6-50 MG PO TABS
1.0000 | ORAL_TABLET | Freq: Two times a day (BID) | ORAL | Status: DC
  Administered 2018-06-22 – 2018-06-26 (×9): 1 via ORAL
  Filled 2018-06-22 (×9): qty 1

## 2018-06-22 MED ORDER — LEVOTHYROXINE SODIUM 100 MCG PO TABS
100.0000 ug | ORAL_TABLET | Freq: Every day | ORAL | Status: DC
  Administered 2018-06-23 – 2018-06-26 (×4): 100 ug via ORAL
  Filled 2018-06-22 (×4): qty 1

## 2018-06-22 MED ORDER — ACETAMINOPHEN 160 MG/5ML PO SOLN
650.0000 mg | ORAL | Status: DC | PRN
  Filled 2018-06-22: qty 20.3

## 2018-06-22 MED ORDER — VENLAFAXINE HCL ER 75 MG PO CP24
75.0000 mg | ORAL_CAPSULE | Freq: Every day | ORAL | Status: DC
  Administered 2018-06-22 – 2018-06-26 (×3): 75 mg via ORAL
  Filled 2018-06-22 (×5): qty 1

## 2018-06-22 MED ORDER — TRAZODONE HCL 50 MG PO TABS
50.0000 mg | ORAL_TABLET | Freq: Every evening | ORAL | Status: DC | PRN
  Filled 2018-06-22: qty 1

## 2018-06-22 MED ORDER — POLYETHYLENE GLYCOL 3350 17 G PO PACK
17.0000 g | PACK | Freq: Every day | ORAL | Status: DC
  Administered 2018-06-22 – 2018-06-25 (×2): 17 g via ORAL
  Filled 2018-06-22 (×2): qty 1

## 2018-06-22 MED ORDER — MORPHINE SULFATE (PF) 2 MG/ML IV SOLN
0.5000 mg | INTRAVENOUS | Status: DC | PRN
  Administered 2018-06-22: 1 mg via INTRAVENOUS
  Filled 2018-06-22: qty 1

## 2018-06-22 MED ORDER — AMLODIPINE BESYLATE 5 MG PO TABS
2.5000 mg | ORAL_TABLET | Freq: Every day | ORAL | Status: DC
  Administered 2018-06-22 – 2018-06-23 (×2): 2.5 mg via ORAL
  Filled 2018-06-22 (×2): qty 1

## 2018-06-22 MED ORDER — VENLAFAXINE HCL ER 75 MG PO CP24
150.0000 mg | ORAL_CAPSULE | Freq: Every day | ORAL | Status: DC
  Administered 2018-06-22 – 2018-06-26 (×5): 150 mg via ORAL
  Filled 2018-06-22 (×3): qty 2
  Filled 2018-06-22: qty 1
  Filled 2018-06-22: qty 2

## 2018-06-22 MED ORDER — STROKE: EARLY STAGES OF RECOVERY BOOK
Freq: Once | Status: AC
  Administered 2018-06-22: 22:00:00

## 2018-06-22 MED ORDER — ACETAMINOPHEN 650 MG RE SUPP: 650.0000 mg | RECTAL | Status: DC | PRN

## 2018-06-22 MED ORDER — SODIUM CHLORIDE 0.9 % IV SOLN
INTRAVENOUS | Status: DC
  Administered 2018-06-22 – 2018-06-23 (×2): via INTRAVENOUS

## 2018-06-22 MED ORDER — ACETAMINOPHEN 325 MG PO TABS
650.0000 mg | ORAL_TABLET | ORAL | Status: DC | PRN
  Administered 2018-06-22 – 2018-06-26 (×6): 650 mg via ORAL
  Filled 2018-06-22 (×6): qty 2

## 2018-06-22 MED ORDER — LABETALOL HCL 5 MG/ML IV SOLN
INTRAVENOUS | Status: AC
  Administered 2018-06-23: 5 mg via INTRAVENOUS
  Filled 2018-06-22: qty 4

## 2018-06-22 NOTE — Care Management Note (Signed)
Case Management Note  Patient Details  Name: Lisa Haney MRN: [id number] Date of Birth: January 07, 1927  Subjective/Objective:  Patient admitted to [facility] under observation status for aphasia. RNCM consulted on patient to provide MOON letter and complete assessment. Patient lives at home with her son. Patient maintains that she is mostly independent with her activities of daily living all though her strength "isnt what it used to be". Patient has had recent rehab stays at [facility] and according to her previous discharge approximately 1 week ago she refused home health. Patient has a walker in the home as well as oxygen and cpap through [employer]. Patient unsure if need for O2 is full times because she "only wears it when needed". PCP is Glendon Axe. Uses [facility] without issues.                    Action/Plan:   Expected Discharge Date:                  Expected Discharge Plan:     In-House Referral:     Discharge planning Services     Post Acute Care Choice:    Choice offered to:     DME Arranged:    DME Agency:     HH Arranged:    HH Agency:     Status of Service:     If discussed at [employer] of Stay Meetings, dates discussed:    Additional Comments:  Latanya Maudlin, RN 06/22/2018, 3:39 PM

## 2018-06-22 NOTE — Progress Notes (Signed)
85 year old female with multiple medical problems, recent discharge from the hospital was brought in secondary to altered mental status -Discussed with patient and family at bedside.  Apparently patient started acting confused yesterday morning.  She is more alert and oriented now.  She says she had to strain as she has been constipated for a few days now. -MRI was done, stroke has been ruled out.  Speech is very clear -Repeat bedside swallow eval and restart on her diet. -Medications restarted for good blood pressure control at this time -Lab indicates mild dehydration with hyponatremia and acute renal insufficiency.  IV fluids started

## 2018-06-22 NOTE — Care Management Obs Status (Signed)
[place] NOTIFICATION   Patient Details  Name: Lisa Haney MRN: [id number] Date of Birth: [birth year]   Medicare Observation Status Notification Given:  Yes    Dre Gamino A Arali Somera, RN 06/22/2018, 3:39 PM

## 2018-06-22 NOTE — ED Notes (Addendum)
[place] at bedside at this time. Report received from [facility].

## 2018-06-22 NOTE — H&P (Signed)
[place] [facility] [place] NAME: Lisa Haney    MR#:  [id number]  DATE OF BIRTH:  1926-10-05  DATE OF ADMISSION:  06/21/2018  PRIMARY CARE PHYSICIAN: Glendon Axe, MD   REQUESTING/REFERRING PHYSICIAN: Joni Fears, MD  CHIEF COMPLAINT:   Chief Complaint  Patient presents with  . Headache    HISTORY OF PRESENT ILLNESS:  Lisa Haney  is a 85 y.o. female who presents with chief complaint as above.  Patient presents to the emergency department for complaint of headache.  She is also complains of some expressive aphasia.  This was noted to be persistent in the ED, with some word finding difficulty.  She is a poor historian and is unable to relate clear details of her situation, though seems that her symptoms likely started between 24 and 48 hours ago.  Concern for stroke is high and hospitalist were called for admission  PAST MEDICAL HISTORY:   Past Medical History:  Diagnosis Date  . Arthritis   . Cancer ([place])    skin cancer on hand  . COPD (chronic obstructive pulmonary disease) ([place])   . Coronary artery disease    stent  . Diverticulitis   . Hypertension   . Stroke [facility])    TIA  . TIA (transient ischemic attack)      PAST SURGICAL HISTORY:   Past Surgical History:  Procedure Laterality Date  . ABDOMINAL HYSTERECTOMY    . CAROTID STENT    . CHOLECYSTECTOMY    . DILATION AND CURETTAGE OF UTERUS    . NASAL SINUS SURGERY    . OVARIAN CYST REMOVAL    . REPLACEMENT TOTAL KNEE Right   . TONSILLECTOMY       SOCIAL HISTORY:   Social History   Tobacco Use  . Smoking status: Never Smoker  . Smokeless tobacco: Never Used  Substance Use Topics  . Alcohol use: No     FAMILY HISTORY:   Family history reviewed and is non-contributory DRUG ALLERGIES:   Allergies  Allergen Reactions  . Doxycycline Other (See Comments)    Severe upset stomach and pain     MEDICATIONS AT HOME:   Prior to Admission medications    Medication Sig Start Date End Date Taking? Authorizing Provider  amLODipine (NORVASC) 2.5 MG tablet Take 2.5 mg by mouth daily.    [provider]  aspirin 81 MG tablet Take 81 mg by mouth daily.    [provider]  cholecalciferol (VITAMIN D) 1000 units tablet Take 1,000 Units by mouth daily.    [provider]  furosemide (LASIX) 40 MG tablet Take 120 mg by mouth daily.     [provider]  levothyroxine (SYNTHROID, LEVOTHROID) 100 MCG tablet Take 100 mcg by mouth daily before breakfast.     [provider]  lisinopril (PRINIVIL,ZESTRIL) 40 MG tablet Take 40 mg by mouth daily.     [provider]  loratadine (CLARITIN) 10 MG tablet Take 10 mg by mouth daily at 2 PM.     [provider]  Melatonin 5 MG TABS Take 5 mg by mouth daily at 8 pm.     [provider]  pravastatin (PRAVACHOL) 40 MG tablet Take 40 mg by mouth daily at 8 pm.     [provider]  sildenafil (REVATIO) 20 MG tablet Take 60 mg by mouth 3 (three) times daily.    [provider]  Venlafaxine HCl 225 MG TB24 Take 225 mg  by mouth daily.    [provider]    REVIEW OF SYSTEMS:  Review of Systems  Constitutional: Negative for chills, fever, malaise/fatigue and weight loss.  HENT: Negative for ear pain, hearing loss and tinnitus.   Eyes: Negative for blurred vision, double vision, pain and redness.  Respiratory: Negative for cough, hemoptysis and shortness of breath.   Cardiovascular: Negative for chest pain, palpitations, orthopnea and leg swelling.  Gastrointestinal: Negative for abdominal pain, constipation, diarrhea, nausea and vomiting.  Genitourinary: Negative for dysuria, frequency and hematuria.  Musculoskeletal: Negative for back pain, joint pain and neck pain.  Skin:       No acne, rash, or lesions  Neurological: Positive for speech change and headaches. Negative for dizziness, tremors, focal weakness and weakness.   Endo/Heme/Allergies: Negative for polydipsia. Does not bruise/bleed easily.  Psychiatric/Behavioral: Negative for depression. The patient is not nervous/anxious and does not have insomnia.      VITAL SIGNS:   Vitals:   06/21/18 1945 06/21/18 2047 06/21/18 2218 06/22/18 0012  BP:  (!) 156/55 (!) 163/95 (!) 175/65  Pulse:  89 98 94  Resp:    17  Temp:      TempSrc:      SpO2:  94% 92% 98%  Weight: 90.7 kg     Height: 5\' 3"  (1.6 m)      Wt Readings from Last 3 Encounters:  06/21/18 90.7 kg  06/12/18 96.6 kg  06/07/17 80.3 kg    PHYSICAL EXAMINATION:  Physical Exam  Vitals reviewed. Constitutional: She is oriented to person, place, and time. She appears well-developed and well-nourished. No distress.  HENT:  Head: Normocephalic and atraumatic.  Mouth/Throat: Oropharynx is clear and moist.  Eyes: Pupils are equal, round, and reactive to light. Conjunctivae and EOM are normal. No scleral icterus.  Neck: Normal range of motion. Neck supple. No JVD present. No thyromegaly present.  Cardiovascular: Normal rate, regular rhythm and intact distal pulses. Exam reveals no gallop and no friction rub.  No murmur heard. Respiratory: Effort normal and breath sounds normal. No respiratory distress. She has no wheezes. She has no rales.  GI: Soft. Bowel sounds are normal. She exhibits no distension. There is no abdominal tenderness.  Musculoskeletal: Normal range of motion.        General: No edema.     Comments: No arthritis, no gout  Lymphadenopathy:    She has no cervical adenopathy.  Neurological: She is alert and oriented to person, place, and time. No cranial nerve deficit.  Neurologic: Cranial nerves II-XII intact, Sensation intact to light touch/pinprick, 5/5 strength in all extremities, no dysarthria, + aphasia, no dysphagia, memory intact, finger to nose testing showed no abnormality, no pronator drift  Skin: Skin is warm and dry. No rash noted. No erythema.  Psychiatric: She has  a normal mood and affect. Her behavior is normal. Judgment and thought content normal.    LABORATORY PANEL:   CBC Recent Labs  Lab 06/21/18 1942  WBC 7.1  HGB 13.6  HCT 41.5  PLT 195   ------------------------------------------------------------------------------------------------------------------  Chemistries  Recent Labs  Lab 06/21/18 1942  NA 128*  K 4.6  CL 94*  CO2 24  GLUCOSE 108*  BUN 33*  CREATININE 1.26*  CALCIUM 9.0  AST 22  ALT 13  ALKPHOS 81  BILITOT 0.5   ------------------------------------------------------------------------------------------------------------------  Cardiac Enzymes No results for input(s): TROPONINI in the last 168 hours. ------------------------------------------------------------------------------------------------------------------  RADIOLOGY:  Ct Head Wo Contrast  Result Date: 06/21/2018 CLINICAL  DATA:  Vision changes, headache EXAM: CT HEAD WITHOUT CONTRAST TECHNIQUE: Contiguous axial images were obtained from the base of the skull through the vertex without intravenous contrast. COMPARISON:  06/07/2016 FINDINGS: Brain: Old right occipital infarct is stable. There is atrophy and chronic small vessel disease changes. No acute intracranial abnormality. Specifically, no hemorrhage, hydrocephalus, mass lesion, acute infarction, or significant intracranial injury. Vascular: No hyperdense vessel or unexpected calcification. Skull: No acute calvarial abnormality. Sinuses/Orbits: No acute findings Other: None IMPRESSION: Old right occipital infarct. Atrophy, chronic microvascular disease. No acute intracranial abnormality. Electronically Signed   By: Rolm Baptise M.D.   On: 06/21/2018 21:45    EKG:   Orders placed or performed during the hospital encounter of 06/21/18  . EKG 12-Lead  . EKG 12-Lead  . ED EKG  . ED EKG    IMPRESSION AND PLAN:  Principal Problem:   Aphasia -patient has some mild intermittent word finding difficulty,  admit per stroke admission order set with appropriate imaging, consults, labs Active Problems:   COPD (chronic obstructive pulmonary disease) ([place]) -continue home meds   HTN (hypertension) -patient is likely greater than 24 hours from onset of her symptoms, continue home dose antihypertensives   CAD (coronary artery disease) -continue home medications  Chart review performed and case discussed with ED provider. Labs, imaging and/or ECG reviewed by provider and discussed with patient/family. Management plans discussed with the patient and/or family.  DVT PROPHYLAXIS: SubQ lovenox   GI PROPHYLAXIS:  None  ADMISSION STATUS: Observation  CODE STATUS: Full Code Status History    Date Active Date Inactive Code Status Order ID Comments User Context   06/11/2018 0412 06/12/2018 1849 Full Code [id number]  Harrie Foreman, MD Inpatient   05/14/2017 1927 05/17/2017 1739 Full Code [id number]  Demetrios Loll, MD Inpatient   05/04/2017 1755 05/09/2017 1507 Full Code [id number]  Fritzi Mandes, MD Inpatient   06/21/2016 0959 06/21/2016 1413 Full Code [id number]  Isaias Cowman, MD Inpatient   02/13/2016 0417 02/15/2016 2021 Full Code [id number]  Hugelmeyer, Ubaldo Glassing, DO Inpatient      TOTAL TIME TAKING CARE OF THIS PATIENT: 40 minutes.   Ethlyn Daniels 06/22/2018, 1:03 AM  [facility]  Office  [phone]  CC: Primary care physician; Glendon Axe, MD  Note:  This document was prepared using Dragon voice recognition software and may include unintentional dictation errors.

## 2018-06-22 NOTE — ED Notes (Signed)
Patient's oxygen saturation level decreaed to 88% on RA. RN to bedside. Patient placed on 2L [place]. RN will continue to monitor.

## 2018-06-22 NOTE — ED Notes (Signed)
Pt son clinton called and requested information. [name] is the son that pt lives with his number [phone]. MRI screening was completed. RN will continue to monitor.

## 2018-06-22 NOTE — ED Notes (Signed)
Notified MD Marcille Blanco of patient's BP

## 2018-06-22 NOTE — ED Notes (Signed)
Family at bedside. Room darkened for patient's comfort.

## 2018-06-22 NOTE — ED Notes (Signed)
Family at bedside. Pt is in pain, attending paged and new med orders were received. RN will monitor.

## 2018-06-22 NOTE — Consult Note (Signed)
Referring Physician: Tressia Miners    Chief Complaint: Aphasia  HPI: Lisa Haney is an 85 y.o. female who is unable to provide much history today.  Family not present therefore all history obtained from the chart.  Patient presented to the ED complaining of generalized headache, severe associated blurry vision and difficulty with speech.  She reported that symptoms started around lunchtime yon Friday.  Her son thought her symptoms started prior to that.  They have been constant since then.   Date last known well: Unable to determine Time last known well: Unable to determine tPA Given: No: Unable to determine LKW  Past Medical History:  Diagnosis Date  . Arthritis   . Cancer ([place])    skin cancer on hand  . COPD (chronic obstructive pulmonary disease) ([place])   . Coronary artery disease    stent  . Diverticulitis   . Hypertension   . Stroke [facility])    TIA  . TIA (transient ischemic attack)     Past Surgical History:  Procedure Laterality Date  . ABDOMINAL HYSTERECTOMY    . CAROTID STENT    . CHOLECYSTECTOMY    . DILATION AND CURETTAGE OF UTERUS    . NASAL SINUS SURGERY    . OVARIAN CYST REMOVAL    . REPLACEMENT TOTAL KNEE Right   . TONSILLECTOMY      History reviewed. No pertinent family history. Social History:  reports that she has never smoked. She has never used smokeless tobacco. She reports that she does not drink alcohol or use drugs.  Allergies:  Allergies  Allergen Reactions  . Doxycycline Other (See Comments)    Severe upset stomach and pain     Medications:  Prior to Admission medications   Medication Sig Start Date End Date Taking? Authorizing Provider  amLODipine (NORVASC) 2.5 MG tablet Take 2.5 mg by mouth daily.   Yes [provider]  aspirin 81 MG tablet Take 81 mg by mouth daily.   Yes [provider]  cholecalciferol (VITAMIN D) 1000 units tablet Take 1,000 Units by mouth daily.   Yes [provider]  furosemide (LASIX) 40  MG tablet Take 40 mg by mouth daily.    Yes [provider]  levothyroxine (SYNTHROID, LEVOTHROID) 100 MCG tablet Take 100 mcg by mouth daily before breakfast.    Yes [provider]  lisinopril (PRINIVIL,ZESTRIL) 40 MG tablet Take 40 mg by mouth daily.    Yes [provider]  loratadine (CLARITIN) 10 MG tablet Take 10 mg by mouth daily at 2 PM.    Yes [provider]  Melatonin 5 MG TABS Take 5 mg by mouth daily at 8 pm.    Yes [provider]  pravastatin (PRAVACHOL) 40 MG tablet Take 40 mg by mouth daily at 8 pm.    Yes [provider]  sildenafil (REVATIO) 20 MG tablet Take 60 mg by mouth 3 (three) times daily.   Yes [provider]  Venlafaxine HCl 225 MG TB24 Take 225 mg by mouth daily.    [provider]    ROS: History obtained from the patient  General ROS: negative for - chills, fatigue, fever, night sweats, weight gain or weight loss Psychological ROS: negative for - behavioral disorder, hallucinations, memory difficulties, mood swings or suicidal ideation Ophthalmic ROS:  as noted in HPI ENT ROS: negative for - epistaxis, nasal discharge, oral lesions, sore throat, tinnitus or vertigo Allergy and Immunology ROS: negative for - hives or itchy/watery eyes  Hematological and Lymphatic ROS: negative for - bleeding problems, bruising or swollen lymph nodes Endocrine ROS: negative for - galactorrhea, hair pattern changes, polydipsia/polyuria or temperature intolerance Respiratory ROS: negative for - cough, hemoptysis, shortness of breath or wheezing Cardiovascular ROS:  chest pain Gastrointestinal ROS: negative for - abdominal pain, diarrhea, hematemesis, nausea/vomiting or stool incontinence Genito-Urinary ROS: negative for - dysuria, hematuria, incontinence or urinary frequency/urgency Musculoskeletal ROS: left arm, pain Neurological ROS: as noted in HPI Dermatological ROS: negative for rash and skin lesion  changes  Physical Examination: Blood pressure (!) 184/65, pulse 85, temperature 97.6 F (36.4 C), temperature source Oral, resp. rate 17, height 5\' 3"  (1.6 m), weight 90.7 kg, SpO2 98 %.  HEENT-  Normocephalic, no lesions, without obvious abnormality.  Normal external eye and conjunctiva.  Normal TM's bilaterally.  Normal auditory canals and external ears. Normal external nose, mucus membranes and septum.  Normal pharynx. Cardiovascular- S1, S2 normal, pulses palpable throughout   Lungs- chest clear, no wheezing, rales, normal symmetric air entry, Heart exam - S1, S2 normal, no murmur, no gallop, rate regular Abdomen- soft, non-tender; bowel sounds normal; no masses,  no organomegaly Extremities- LE edema Lymph-no adenopathy palpable Musculoskeletal-left arm pain to palpation Skin-bruising noted  Neurological Examination   Mental Status: Alert.  Oriented to name place and year.  Appears to have some word finding issues at times.   Able to follow 3 step commands without difficulty. Cranial Nerves: II: Discs flat bilaterally; Blinks to bilateral confrontation, pupils equal, round, reactive to light and accommodation III,IV, VI: ptosis not present, extra-ocular motions intact bilaterally V,VII: smile symmetric, facial light touch sensation normal bilaterally VIII: hearing normal bilaterally IX,X: gag reflex present XI: bilateral shoulder shrug XII: midline tongue extension Motor: Right : Upper extremity   5/5    Left:     Upper extremity   5/5  Lower extremity   5/5     Lower extremity   5/5 Tone and bulk:normal tone throughout; no atrophy noted Sensory: Pinprick and light touch intact throughout, bilaterally Deep Tendon Reflexes: 1+ in the upper extremities and absent in the lower extremities Plantars: Right: mute   Left: mute Cerebellar: Normal finger-to-nose and normal heel-to-shin testing bilaterally Gait: not tested due to safety concerns   Laboratory Studies:  Basic  Metabolic Panel: Recent Labs  Lab 06/21/18 1942  NA 128*  K 4.6  CL 94*  CO2 24  GLUCOSE 108*  BUN 33*  CREATININE 1.26*  CALCIUM 9.0    Liver Function Tests: Recent Labs  Lab 06/21/18 1942  AST 22  ALT 13  ALKPHOS 81  BILITOT 0.5  PROT 7.3  ALBUMIN 3.8   No results for input(s): LIPASE, AMYLASE in the last 168 hours. No results for input(s): AMMONIA in the last 168 hours.  CBC: Recent Labs  Lab 06/21/18 1942  WBC 7.1  NEUTROABS 4.4  HGB 13.6  HCT 41.5  MCV 90.0  PLT 195    Cardiac Enzymes: No results for input(s): CKTOTAL, CKMB, CKMBINDEX, TROPONINI in the last 168 hours.  BNP: Invalid input(s): POCBNP  CBG: Recent Labs  Lab 06/21/18 1955  GLUCAP 109*    Microbiology: Results for orders placed or performed during the hospital encounter of 05/14/17  Blood culture (routine x 2)     Status: None   Collection Time: 05/14/17  1:45 PM  Result Value Ref Range Status   Specimen Description BLOOD RAC  Final   Special Requests   Final    BOTTLES DRAWN  AEROBIC AND ANAEROBIC Blood Culture results may not be optimal due to an excessive volume of blood received in culture bottles   Culture   Final    NO GROWTH 5 DAYS Performed at [facility], [address] [place], [place] [address]    Report Status 05/19/2017 FINAL  Final  Urine Culture     Status: Abnormal   Collection Time: 05/14/17  2:20 PM  Result Value Ref Range Status   Specimen Description   Final    URINE, RANDOM Performed at [facility], [address] [place], [place] [address]    Special Requests   Final    NONE Performed at [facility], [address] [place], [place] [address]    Culture >=100,000 COLONIES/mL CITROBACTER FREUNDII (A)  Final   Report Status 05/17/2017 FINAL  Final   Organism ID, Bacteria CITROBACTER FREUNDII (A)  Final      Susceptibility   Citrobacter freundii - MIC*    CEFAZOLIN >=64 RESISTANT Resistant     CEFTRIAXONE >=64  RESISTANT Resistant     CIPROFLOXACIN <=0.25 SENSITIVE Sensitive     GENTAMICIN <=1 SENSITIVE Sensitive     IMIPENEM 1 SENSITIVE Sensitive     NITROFURANTOIN <=16 SENSITIVE Sensitive     TRIMETH/SULFA <=20 SENSITIVE Sensitive     PIP/TAZO >=128 RESISTANT Resistant     * >=100,000 COLONIES/mL CITROBACTER FREUNDII  Blood culture (routine x 2)     Status: None   Collection Time: 05/14/17  3:10 PM  Result Value Ref Range Status   Specimen Description BLOOD RAC  Final   Special Requests   Final    BOTTLES DRAWN AEROBIC AND ANAEROBIC Blood Culture adequate volume   Culture   Final    NO GROWTH 5 DAYS Performed at [facility], [address] [place], [place] [address]    Report Status 05/19/2017 FINAL  Final  MRSA PCR Screening     Status: None   Collection Time: 05/15/17  1:30 PM  Result Value Ref Range Status   MRSA by PCR NEGATIVE NEGATIVE Final    Comment:        The GeneXpert MRSA Assay (FDA approved for NASAL specimens only), is one component of a comprehensive MRSA colonization surveillance program. It is not intended to diagnose MRSA infection nor to guide or monitor treatment for MRSA infections. Performed at [facility], [address] [place],  [address]     Coagulation Studies: Recent Labs    06/21/18 1942  LABPROT 13.2  INR 1.01    Urinalysis:  Recent Labs  Lab 06/22/18 0454  COLORURINE YELLOW*  LABSPEC 1.011  PHURINE 8.0  GLUCOSEU NEGATIVE  HGBUR NEGATIVE  BILIRUBINUR NEGATIVE  KETONESUR NEGATIVE  PROTEINUR NEGATIVE  NITRITE NEGATIVE  LEUKOCYTESUR NEGATIVE    Lipid Panel:    Component Value Date/Time   CHOL 139 06/22/2018 0454   TRIG 82 06/22/2018 0454   HDL 50 06/22/2018 0454   CHOLHDL 2.8 06/22/2018 0454   VLDL 16 06/22/2018 0454   LDLCALC 73 06/22/2018 0454    HgbA1C:  Lab Results  Component Value Date   HGBA1C 5.6 06/21/2018    Urine Drug Screen:      Component Value Date/Time   LABOPIA NONE  DETECTED 06/22/2018 0454   COCAINSCRNUR NONE DETECTED 06/22/2018 0454   LABBENZ NONE DETECTED 06/22/2018 0454   AMPHETMU NONE DETECTED 06/22/2018 0454   THCU NONE DETECTED 06/22/2018 0454   LABBARB NONE DETECTED 06/22/2018 0454  Alcohol Level:  Recent Labs  Lab 06/21/18 1942  ETH <10    Other results: EKG: sinus rhythm at 89 bpm with first degree AV block  Imaging: Ct Head Wo Contrast  Result Date: 06/21/2018 CLINICAL DATA:  Vision changes, headache EXAM: CT HEAD WITHOUT CONTRAST TECHNIQUE: Contiguous axial images were obtained from the base of the skull through the vertex without intravenous contrast. COMPARISON:  06/07/2016 FINDINGS: Brain: Old right occipital infarct is stable. There is atrophy and chronic small vessel disease changes. No acute intracranial abnormality. Specifically, no hemorrhage, hydrocephalus, mass lesion, acute infarction, or significant intracranial injury. Vascular: No hyperdense vessel or unexpected calcification. Skull: No acute calvarial abnormality. Sinuses/Orbits: No acute findings Other: None IMPRESSION: Old right occipital infarct. Atrophy, chronic microvascular disease. No acute intracranial abnormality. Electronically Signed   By: Rolm Baptise M.D.   On: 06/21/2018 21:45   Mr Brain Wo Contrast  Result Date: 06/22/2018 CLINICAL DATA:  Focal neuro deficit greater than 6 hours. Rule out stroke. EXAM: MRI HEAD WITHOUT CONTRAST MRA HEAD WITHOUT CONTRAST TECHNIQUE: Multiplanar, multiecho pulse sequences of the brain and surrounding structures were obtained without intravenous contrast. Angiographic images of the head were obtained using MRA technique without contrast. COMPARISON:  CT head 06/21/2018 FINDINGS: MRI HEAD FINDINGS Brain: Negative for acute infarct. Chronic microvascular ischemic change in the white matter. Chronic infarct right occipital cortex. Chronic ischemia in the pons. Negative for hemorrhage or mass. Vascular: Normal arterial flow voids Skull  and upper cervical spine: Bony exostosis right parietal bone appears benign on CT. Sinuses/Orbits: Mild mucosal edema paranasal sinuses. Bilateral cataract surgery Other: None MRA HEAD FINDINGS Both vertebral arteries patent to the basilar. Right vertebral dominant. PICA patent bilaterally. Mild irregularity of the basilar. Moderate to severe stenosis right posterior cerebral artery. Moderate stenosis distal left posterior cerebral artery. Atherosclerotic disease in the cavernous carotid bilaterally with moderate stenosis on the left and mild stenosis on the right. Atherosclerotic disease anterior and middle cerebral arteries without occlusion. Severe stenosis left A2 segment. Moderate stenosis left MCA bifurcation. Moderate stenosis right M1. Negative for aneurysm IMPRESSION: 1. Negative for acute infarct. 2. Atrophy and moderate chronic ischemic change 3. Moderately severe intracranial atherosclerotic disease Electronically Signed   By: Franchot Gallo M.D.   On: 06/22/2018 10:10   US Carotid Bilateral (at [facility] Only)  Result Date: 06/22/2018 CLINICAL DATA:  Aphasia. EXAM: BILATERAL CAROTID DUPLEX ULTRASOUND TECHNIQUE: Pearline Cables scale imaging, color Doppler and duplex ultrasound were performed of bilateral carotid and vertebral arteries in the neck. COMPARISON:  Head CT 06/21/2018 FINDINGS: Criteria: Quantification of carotid stenosis is based on velocity parameters that correlate the residual internal carotid diameter with NASCET-based stenosis levels, using the diameter of the distal internal carotid lumen as the denominator for stenosis measurement. The following velocity measurements were obtained: RIGHT ICA: 64 cm/sec CCA: 73 cm/sec SYSTOLIC ICA/CCA RATIO:  0.9 ECA: 123 cm/sec LEFT ICA: 56 cm/sec CCA: 93 cm/sec SYSTOLIC ICA/CCA RATIO:  0.6 ECA: 138 cm/sec RIGHT CAROTID ARTERY: Minimal atherosclerotic plaque at the bifurcation of ICA and ECA. RIGHT VERTEBRAL ARTERY:  Antegrade flow LEFT CAROTID ARTERY:   Normal. LEFT VERTEBRAL ARTERY:  Antegrade flow. IMPRESSION: Minimal non flow limiting atherosclerotic plaque at the bifurcation of right ICA and ECA. Electronically Signed   By: Fidela Salisbury M.D.   On: 06/22/2018 07:56   Mr Jodene Nam Head/brain JX Cm  Result Date: 06/22/2018 CLINICAL DATA:  Focal neuro deficit greater than 6 hours. Rule out stroke. EXAM: MRI HEAD WITHOUT CONTRAST  MRA HEAD WITHOUT CONTRAST TECHNIQUE: Multiplanar, multiecho pulse sequences of the brain and surrounding structures were obtained without intravenous contrast. Angiographic images of the head were obtained using MRA technique without contrast. COMPARISON:  CT head 06/21/2018 FINDINGS: MRI HEAD FINDINGS Brain: Negative for acute infarct. Chronic microvascular ischemic change in the white matter. Chronic infarct right occipital cortex. Chronic ischemia in the pons. Negative for hemorrhage or mass. Vascular: Normal arterial flow voids Skull and upper cervical spine: Bony exostosis right parietal bone appears benign on CT. Sinuses/Orbits: Mild mucosal edema paranasal sinuses. Bilateral cataract surgery Other: None MRA HEAD FINDINGS Both vertebral arteries patent to the basilar. Right vertebral dominant. PICA patent bilaterally. Mild irregularity of the basilar. Moderate to severe stenosis right posterior cerebral artery. Moderate stenosis distal left posterior cerebral artery. Atherosclerotic disease in the cavernous carotid bilaterally with moderate stenosis on the left and mild stenosis on the right. Atherosclerotic disease anterior and middle cerebral arteries without occlusion. Severe stenosis left A2 segment. Moderate stenosis left MCA bifurcation. Moderate stenosis right M1. Negative for aneurysm IMPRESSION: 1. Negative for acute infarct. 2. Atrophy and moderate chronic ischemic change 3. Moderately severe intracranial atherosclerotic disease Electronically Signed   By: Franchot Gallo M.D.   On: 06/22/2018 10:10    Assessment: 85  y.o. female presenting with complaints of vision blurring, difficulty with speech and headache.  Concern initially was for acute infarct.  MRI of the brain performed and shows no acute changes.  Patient with metabolic abnormalities noted and her symptoms may very well be secondary to her metabolic abnormalities.   A1c 5.6, LDL 73.  Patient on a statin.  Stroke Risk Factors - hypertension  Plan: 1. HgbA1c, fasting lipid panel 2. Continue ASA and statin 3. Agree with addressing metabolic issues 4. Telemetry monitoring 5. Frequent neuro checks   Alexis Goodell, MD Neurology [phone] 06/22/2018, 10:35 AM

## 2018-06-23 ENCOUNTER — Observation Stay (HOSPITAL_COMMUNITY)
Admit: 2018-06-23 | Discharge: 2018-06-23 | Disposition: A | Discharge status: Home / Self Care | Payer: Medicare Other | Attending: Internal Medicine | Admitting: Internal Medicine

## 2018-06-23 DIAGNOSIS — Z90710 Acquired absence of both cervix and uterus: Secondary | ICD-10-CM | POA: Diagnosis not present

## 2018-06-23 DIAGNOSIS — F418 Other specified anxiety disorders: Secondary | ICD-10-CM | POA: Diagnosis present

## 2018-06-23 DIAGNOSIS — Z881 Allergy status to other antibiotic agents status: Secondary | ICD-10-CM | POA: Diagnosis not present

## 2018-06-23 DIAGNOSIS — Z7982 Long term (current) use of aspirin: Secondary | ICD-10-CM | POA: Diagnosis not present

## 2018-06-23 DIAGNOSIS — E871 Hypo-osmolality and hyponatremia: Secondary | ICD-10-CM | POA: Diagnosis present

## 2018-06-23 DIAGNOSIS — Z79899 Other long term (current) drug therapy: Secondary | ICD-10-CM | POA: Diagnosis not present

## 2018-06-23 DIAGNOSIS — G9341 Metabolic encephalopathy: Secondary | ICD-10-CM | POA: Diagnosis present

## 2018-06-23 DIAGNOSIS — I351 Nonrheumatic aortic (valve) insufficiency: Secondary | ICD-10-CM | POA: Diagnosis not present

## 2018-06-23 DIAGNOSIS — Z8673 Personal history of transient ischemic attack (TIA), and cerebral infarction without residual deficits: Secondary | ICD-10-CM | POA: Diagnosis not present

## 2018-06-23 DIAGNOSIS — I672 Cerebral atherosclerosis: Secondary | ICD-10-CM | POA: Diagnosis present

## 2018-06-23 DIAGNOSIS — I272 Pulmonary hypertension, unspecified: Secondary | ICD-10-CM | POA: Diagnosis present

## 2018-06-23 DIAGNOSIS — Z9049 Acquired absence of other specified parts of digestive tract: Secondary | ICD-10-CM | POA: Diagnosis not present

## 2018-06-23 DIAGNOSIS — I2510 Atherosclerotic heart disease of native coronary artery without angina pectoris: Secondary | ICD-10-CM | POA: Diagnosis present

## 2018-06-23 DIAGNOSIS — Z79890 Hormone replacement therapy (postmenopausal): Secondary | ICD-10-CM | POA: Diagnosis not present

## 2018-06-23 DIAGNOSIS — R4701 Aphasia: Secondary | ICD-10-CM | POA: Diagnosis present

## 2018-06-23 DIAGNOSIS — I158 Other secondary hypertension: Secondary | ICD-10-CM | POA: Diagnosis present

## 2018-06-23 DIAGNOSIS — J449 Chronic obstructive pulmonary disease, unspecified: Secondary | ICD-10-CM | POA: Diagnosis present

## 2018-06-23 DIAGNOSIS — E860 Dehydration: Secondary | ICD-10-CM | POA: Diagnosis present

## 2018-06-23 DIAGNOSIS — Z85828 Personal history of other malignant neoplasm of skin: Secondary | ICD-10-CM | POA: Diagnosis not present

## 2018-06-23 DIAGNOSIS — I160 Hypertensive urgency: Secondary | ICD-10-CM | POA: Diagnosis present

## 2018-06-23 DIAGNOSIS — R51 Headache: Secondary | ICD-10-CM | POA: Diagnosis present

## 2018-06-23 DIAGNOSIS — R29705 NIHSS score 5: Secondary | ICD-10-CM | POA: Diagnosis present

## 2018-06-23 DIAGNOSIS — Z96651 Presence of right artificial knee joint: Secondary | ICD-10-CM | POA: Diagnosis present

## 2018-06-23 LAB — BASIC METABOLIC PANEL
Anion gap: 8 (ref 5–15)
BUN: 21 mg/dL (ref 8–23)
CO2: 23 mmol/L (ref 22–32)
Calcium: 8.7 mg/dL — ABNORMAL LOW (ref 8.9–10.3)
Chloride: 100 mmol/L (ref 98–111)
Creatinine, Ser: 0.87 mg/dL (ref 0.44–1.00)
GFR calc Af Amer: >60 mL/min (ref >60)
GFR calc non Af Amer: 59 mL/min — ABNORMAL LOW (ref >60)
GLUCOSE: 97 mg/dL (ref 70–99)
Potassium: 4.2 mmol/L (ref 3.5–5.1)
Sodium: 131 mmol/L — ABNORMAL LOW (ref 135–145)

## 2018-06-23 LAB — ECHOCARDIOGRAM COMPLETE
Height: 63 in
Weight: 3200 oz

## 2018-06-23 MED ORDER — LABETALOL HCL 5 MG/ML IV SOLN
5.0000 mg | INTRAVENOUS | Status: DC | PRN
  Administered 2018-06-24: 09:00:00 5 mg via INTRAVENOUS
  Filled 2018-06-23: qty 4

## 2018-06-23 MED ORDER — HYDRALAZINE HCL 50 MG PO TABS
25.0000 mg | ORAL_TABLET | Freq: Three times a day (TID) | ORAL | Status: DC
  Administered 2018-06-23 – 2018-06-26 (×8): 25 mg via ORAL
  Filled 2018-06-23 (×8): qty 1

## 2018-06-23 MED ORDER — AMLODIPINE BESYLATE 5 MG PO TABS
7.5000 mg | ORAL_TABLET | Freq: Once | ORAL | Status: AC
  Administered 2018-06-23: 7.5 mg via ORAL
  Filled 2018-06-23: qty 2

## 2018-06-23 MED ORDER — AMLODIPINE BESYLATE 10 MG PO TABS
10.0000 mg | ORAL_TABLET | Freq: Every day | ORAL | Status: DC
  Administered 2018-06-24 – 2018-06-26 (×3): 10 mg via ORAL
  Filled 2018-06-23 (×3): qty 1

## 2018-06-23 NOTE — Progress Notes (Signed)
OT Cancellation Note  Patient Details Name: Lisa Haney MRN: [id number] DOB: 1926-09-16   Cancelled Treatment:    Reason Eval/Treat Not Completed: Medical issues which prohibited therapy. Order received, chart reviewed. Upon arrival to pt room, RN in room to monitor vitals. RN notified OT pt with BP of 202/73. RN requested OT return at later time when BP more stable. Will follow acutely and re-attempt as available and pt is medically appropriate for tx.   Shara Blazing, M.S., OTR/L Ascom: [phone] 06/23/18, 9:51 AM

## 2018-06-23 NOTE — Progress Notes (Signed)
SLP Cancellation Note  Patient Details Name: Lisa Haney MRN: [id number] DOB: [birth year]   Cancelled treatment:       Reason Eval/Treat Not Completed: SLP screened, no needs identified, will sign off. Chart reviewed. Pt interviewed at length. No apparent evidence of receptive/expressive aphasia or dysarthria at bedside during screen. Pt reports her word finding difficulty has resolved since admission. MRI negative for CVA. Spontaneous speech is 100% fluent and composed of grammatically appropriate and complex sentences. Pt denies any s/s aspiration or dysphagia with current regular diet with thin liquids. No current needs identified. SLP to sign off at this time. Please re-consult with any future change in status requiring re-assessment. Thank you for this consult.   Jaidin Ugarte, MA, CCC-SLP 06/23/2018, 10:28 AM

## 2018-06-23 NOTE — Progress Notes (Signed)
[facility] [place] NAME: Lisa Haney    MR#:  [id number]  DATE OF BIRTH:  02-11-1927  SUBJECTIVE:  CHIEF COMPLAINT:   Chief Complaint  Patient presents with  . Headache   - patient upset that all her meds are not being given at the same time that she takes at home - BP is elevated  REVIEW OF SYSTEMS:  Review of Systems  Constitutional: Negative for chills and fever.  HENT: Negative for congestion, ear discharge, hearing loss and nosebleeds.   Eyes: Negative for blurred vision and double vision.  Respiratory: Negative for cough, shortness of breath and wheezing.   Cardiovascular: Negative for chest pain and palpitations.  Gastrointestinal: Negative for abdominal pain, constipation, diarrhea, nausea and vomiting.  Genitourinary: Negative for dysuria.  Musculoskeletal: Negative for myalgias.  Neurological: Negative for dizziness, focal weakness, seizures, weakness and headaches.  Psychiatric/Behavioral: Negative for depression.    DRUG ALLERGIES:   Allergies  Allergen Reactions  . Doxycycline Other (See Comments)    Severe upset stomach and pain     VITALS:  Blood pressure (!) 158/56, pulse 71, temperature (!) 97.4 F (36.3 C), temperature source Oral, resp. rate 18, height 5\' 3"  (1.6 m), weight 90.7 kg, SpO2 93 %.  PHYSICAL EXAMINATION:  Physical Exam   GENERAL:  85 y.o.-year-old elderly patient lying in the bed with no acute distress.  EYES: Pupils equal, round, reactive to light and accommodation. No scleral icterus. Extraocular muscles intact.  HEENT: Head atraumatic, normocephalic. Oropharynx and nasopharynx clear.  NECK:  Supple, no jugular venous distention. No thyroid enlargement, no tenderness.  LUNGS: Normal breath sounds bilaterally, no wheezing, rales,rhonchi or crepitation. No use of accessory muscles of respiration. Decreased bibasilar breath sounds CARDIOVASCULAR: S1, S2 normal. No  rubs, or gallops. 2/6  systolic murmur present ABDOMEN: Soft, nontender, nondistended. Bowel sounds present. No organomegaly or mass.  EXTREMITIES: No pedal edema, cyanosis, or clubbing.  NEUROLOGIC: Cranial nerves II through XII are intact. Muscle strength 5/5 in all extremities. Sensation intact. Gait not checked.  PSYCHIATRIC: The patient is alert and oriented x 3.  SKIN: No obvious rash, lesion, or ulcer.    LABORATORY PANEL:   CBC Recent Labs  Lab 06/22/18 1850  WBC 7.3  HGB 13.5  HCT 40.7  PLT 181   ------------------------------------------------------------------------------------------------------------------  Chemistries  Recent Labs  Lab 06/21/18 1942  06/23/18 0448  NA 128*  --  131*  K 4.6  --  4.2  CL 94*  --  100  CO2 24  --  23  GLUCOSE 108*  --  97  BUN 33*  --  21  CREATININE 1.26*   < > 0.87  CALCIUM 9.0  --  8.7*  AST 22  --   --   ALT 13  --   --   ALKPHOS 81  --   --   BILITOT 0.5  --   --    < > = values in this interval not displayed.   ------------------------------------------------------------------------------------------------------------------  Cardiac Enzymes No results for input(s): TROPONINI in the last 168 hours. ------------------------------------------------------------------------------------------------------------------  RADIOLOGY:  Ct Head Wo Contrast  Result Date: 06/21/2018 CLINICAL DATA:  Vision changes, headache EXAM: CT HEAD WITHOUT CONTRAST TECHNIQUE: Contiguous axial images were obtained from the base of the skull through the vertex without intravenous contrast. COMPARISON:  06/07/2016 FINDINGS: Brain: Old right occipital infarct is stable. There is atrophy and chronic small vessel disease changes. No acute intracranial abnormality.  Specifically, no hemorrhage, hydrocephalus, mass lesion, acute infarction, or significant intracranial injury. Vascular: No hyperdense vessel or unexpected calcification. Skull: No acute calvarial abnormality.  Sinuses/Orbits: No acute findings Other: None IMPRESSION: Old right occipital infarct. Atrophy, chronic microvascular disease. No acute intracranial abnormality. Electronically Signed   By: Rolm Baptise M.D.   On: 06/21/2018 21:45   Mr Brain Wo Contrast  Result Date: 06/22/2018 CLINICAL DATA:  Focal neuro deficit greater than 6 hours. Rule out stroke. EXAM: MRI HEAD WITHOUT CONTRAST MRA HEAD WITHOUT CONTRAST TECHNIQUE: Multiplanar, multiecho pulse sequences of the brain and surrounding structures were obtained without intravenous contrast. Angiographic images of the head were obtained using MRA technique without contrast. COMPARISON:  CT head 06/21/2018 FINDINGS: MRI HEAD FINDINGS Brain: Negative for acute infarct. Chronic microvascular ischemic change in the white matter. Chronic infarct right occipital cortex. Chronic ischemia in the pons. Negative for hemorrhage or mass. Vascular: Normal arterial flow voids Skull and upper cervical spine: Bony exostosis right parietal bone appears benign on CT. Sinuses/Orbits: Mild mucosal edema paranasal sinuses. Bilateral cataract surgery Other: None MRA HEAD FINDINGS Both vertebral arteries patent to the basilar. Right vertebral dominant. PICA patent bilaterally. Mild irregularity of the basilar. Moderate to severe stenosis right posterior cerebral artery. Moderate stenosis distal left posterior cerebral artery. Atherosclerotic disease in the cavernous carotid bilaterally with moderate stenosis on the left and mild stenosis on the right. Atherosclerotic disease anterior and middle cerebral arteries without occlusion. Severe stenosis left A2 segment. Moderate stenosis left MCA bifurcation. Moderate stenosis right M1. Negative for aneurysm IMPRESSION: 1. Negative for acute infarct. 2. Atrophy and moderate chronic ischemic change 3. Moderately severe intracranial atherosclerotic disease Electronically Signed   By: Franchot Gallo M.D.   On: 06/22/2018 10:10   US Carotid  Bilateral (at [facility] Only)  Result Date: 06/22/2018 CLINICAL DATA:  Aphasia. EXAM: BILATERAL CAROTID DUPLEX ULTRASOUND TECHNIQUE: Pearline Cables scale imaging, color Doppler and duplex ultrasound were performed of bilateral carotid and vertebral arteries in the neck. COMPARISON:  Head CT 06/21/2018 FINDINGS: Criteria: Quantification of carotid stenosis is based on velocity parameters that correlate the residual internal carotid diameter with NASCET-based stenosis levels, using the diameter of the distal internal carotid lumen as the denominator for stenosis measurement. The following velocity measurements were obtained: RIGHT ICA: 64 cm/sec CCA: 73 cm/sec SYSTOLIC ICA/CCA RATIO:  0.9 ECA: 123 cm/sec LEFT ICA: 56 cm/sec CCA: 93 cm/sec SYSTOLIC ICA/CCA RATIO:  0.6 ECA: 138 cm/sec RIGHT CAROTID ARTERY: Minimal atherosclerotic plaque at the bifurcation of ICA and ECA. RIGHT VERTEBRAL ARTERY:  Antegrade flow LEFT CAROTID ARTERY:  Normal. LEFT VERTEBRAL ARTERY:  Antegrade flow. IMPRESSION: Minimal non flow limiting atherosclerotic plaque at the bifurcation of right ICA and ECA. Electronically Signed   By: Fidela Salisbury M.D.   On: 06/22/2018 07:56   Mr Jodene Nam Head/brain WI Cm  Result Date: 06/22/2018 CLINICAL DATA:  Focal neuro deficit greater than 6 hours. Rule out stroke. EXAM: MRI HEAD WITHOUT CONTRAST MRA HEAD WITHOUT CONTRAST TECHNIQUE: Multiplanar, multiecho pulse sequences of the brain and surrounding structures were obtained without intravenous contrast. Angiographic images of the head were obtained using MRA technique without contrast. COMPARISON:  CT head 06/21/2018 FINDINGS: MRI HEAD FINDINGS Brain: Negative for acute infarct. Chronic microvascular ischemic change in the white matter. Chronic infarct right occipital cortex. Chronic ischemia in the pons. Negative for hemorrhage or mass. Vascular: Normal arterial flow voids Skull and upper cervical spine: Bony exostosis right parietal bone appears benign on  CT. Sinuses/Orbits: Mild  mucosal edema paranasal sinuses. Bilateral cataract surgery Other: None MRA HEAD FINDINGS Both vertebral arteries patent to the basilar. Right vertebral dominant. PICA patent bilaterally. Mild irregularity of the basilar. Moderate to severe stenosis right posterior cerebral artery. Moderate stenosis distal left posterior cerebral artery. Atherosclerotic disease in the cavernous carotid bilaterally with moderate stenosis on the left and mild stenosis on the right. Atherosclerotic disease anterior and middle cerebral arteries without occlusion. Severe stenosis left A2 segment. Moderate stenosis left MCA bifurcation. Moderate stenosis right M1. Negative for aneurysm IMPRESSION: 1. Negative for acute infarct. 2. Atrophy and moderate chronic ischemic change 3. Moderately severe intracranial atherosclerotic disease Electronically Signed   By: Franchot Gallo M.D.   On: 06/22/2018 10:10    EKG:   Orders placed or performed during the hospital encounter of 06/21/18  . EKG 12-Lead  . EKG 12-Lead    ASSESSMENT AND PLAN:   85 year old female with past medical history significant for COPD, CAD, hypertension, stroke presents to hospital secondary to confusion  1.  Acute metabolic encephalopathy-secondary to dehydration and hyponatremia -Confusion resolved -MRI of the brain on admission did not reveal any acute infarcts.  Stroke work-up has been done and she has been ruled out for new stroke. -Received IV fluids for hyponatremia -Sodium is at 131 today.  2.  Hypertensive urgency-significantly elevated blood pressures. -Restarted all her home meds. -Increase Norvasc dose.  Receiving IV hydralazine as needed -Also on lisinopril  3.  Pulmonary hypertension-continue Revatio  4.  Depression anxiety-on high-dose Effexor  5.  DVT prophylaxis-Lovenox  Physical therapy consult is pending    All the records are reviewed and case discussed with Care Management/Social  Workerr. Management plans discussed with the patient, family and they are in agreement.  CODE STATUS: Full Code  TOTAL TIME TAKING CARE OF THIS PATIENT: 38 minutes.   POSSIBLE D/C IN 2 DAYS, DEPENDING ON CLINICAL CONDITION.   Gladstone Lighter M.D on 06/23/2018 at 1:50 PM  Between 7am to 6pm - Pager - [phone]  After 6pm go to [email or web address] - password [email or web address] [facility]  Office  [phone]  CC: Primary care physician; Glendon Axe, MD

## 2018-06-23 NOTE — Care Management (Signed)
Patient with issues with her blood pressure. She was admitted from observation today 2/24.  Physical therapy was not able to treat today due to the blood pressure issues.  OT has recommended SNF.  Patient will require a 3 night - medically necessary - stay in order for skilled nursing to  be covered by medicare.

## 2018-06-23 NOTE — Evaluation (Signed)
Occupational Therapy Evaluation Patient Details Name: Lisa Haney MRN: [id number] DOB: 07/31/1926 Today's Date: 06/23/2018    History of Present Illness Lisa Haney  is a 85 y.o. female who presented to the ED with chief complaint complaint of headache and AMS.  She is also complained of some expressive aphasia, which was noted to be persistent in the ED with some word finding difficulty, but has generally resolved since admission.  Pt noted to be poor historial, but symptoms likely started between 24 and 48 hours prior to admission.  MRI negative. Pt with elevated BP and acute metabolic encephalopathy-secondary to dehydration and hyponatremia.    Clinical Impression    Lisa Haney was seen for OT evaluation this date. Prior to hospital admission, she was modified independent in all aspects of ADL/IADL, using a 2WW for mobility within the home and receiving support from her adult children for IADL tasks including shopping, driving, medication management, and cooking. Pt denies falls history in past 12 months. She lives with her son in a 1 story home with a ramped entrance. Currently pt demonstrates impairments in strength, coordination, functional UE use, and activity tolerance; requiring min-mod assist for most ADL tasks and functional mobility. Pt limited by 5/10 dizziness at time of evaluation and was noted to have difficulty attending to verbal prompts. Pt would benefit from skilled OT to address noted impairments and functional limitations (see below for any additional details) in order to maximize safety and independence while minimizing falls risk and caregiver burden.  Upon hospital discharge, recommend pt discharge to [place].     Follow Up Recommendations  SNF    Equipment Recommendations  (TBD)    Recommendations for Other Services       Precautions / Restrictions Precautions Precautions: Fall Precaution Comments: Pt endorsed feeling dizzy with bed mobility on this date.   Restrictions Weight Bearing Restrictions: No      Mobility Bed Mobility Overal bed mobility: Needs Assistance Bed Mobility: Supine to Sit;Sit to Supine     Supine to sit: Modified independent (Device/Increase time);HOB elevated;Min guard Sit to supine: Modified independent (Device/Increase time);Min assist   General bed mobility comments: Pt able to complete sup<>sit with min guard to min assist and VCs t/o for hand placement. Pt required min assist when returning to supine position for mgmt of trunk in bed. Pt endorsed 5/10 dizziness after transfer. O2 monitored with activity dropped to low 90's with movement. Returned to 99-100's with rest and PLB. RN notified.    Transfers Overall transfer level: Needs assistance               General transfer comment: Deferred. Pt endorsed 5/10 dizziness that did not resolve with rest during sup>sit. Declined further OOB activity on this date.     Balance Overall balance assessment: Needs assistance Sitting-balance support: Single extremity supported;Feet supported Sitting balance-Leahy Scale: Fair Sitting balance - Comments: Pt required at least single extremity support to maintain static seated balance. Able to spend short periods without UE support but could not maintain or tolerate challenge.                                    ADL either performed or assessed with clinical judgement   ADL Overall ADL's : Needs assistance/impaired Eating/Feeding: Set up;Supervision/ safety   Grooming: Set up;Supervision/safety   Upper Body Bathing: Set up;Minimal assistance;Sitting   Lower Body Bathing: Set up;Moderate assistance;Sit to/from  stand;With adaptive equipment;Cueing for safety;Cueing for sequencing   Upper Body Dressing : Set up;Minimal assistance;Sitting   Lower Body Dressing: Set up;Moderate assistance;Sit to/from stand;With adaptive equipment;Cueing for sequencing;Cueing for safety   Toilet Transfer:  BSC;RW;Ambulation;Minimal assistance   Toileting- Clothing Manipulation and Hygiene: Moderate assistance;Set up;Sit to/from stand;With adaptive equipment;Cueing for safety;Cueing for sequencing               Vision Baseline Vision/History: Wears glasses Wears Glasses: Reading only Patient Visual Report: Blurring of vision Additional Comments: Pt endorsed blurred vision while in the ED. Says this has resolved since admission.       Perception     Praxis      Pertinent Vitals/Pain Pain Assessment: No/denies pain     Hand Dominance Right   Extremity/Trunk Assessment Upper Extremity Assessment Upper Extremity Assessment: RUE deficits/detail;LUE deficits/detail;Generalized weakness(Pt endorses history of neuropathy, but states this does not limit function.) RUE Deficits / Details: FM coordination limited. Pt noted to have difficulty with opposition. Unable to complete without model from OT, and slow/uncoordinated with visual cue.  RUE Coordination: decreased fine motor LUE Deficits / Details: FM coordination limited. Pt noted to have difficulty with opposition. Unable to complete without model from OT, and slow/uncoordinated with visual cue.  LUE Coordination: decreased fine motor   Lower Extremity Assessment Lower Extremity Assessment: Generalized weakness;Defer to PT evaluation   Cervical / Trunk Assessment Cervical / Trunk Assessment: Normal   Communication Communication Communication: HOH   Cognition Arousal/Alertness: Awake/alert Behavior During Therapy: WFL for tasks assessed/performed Overall Cognitive Status: Within Functional Limits for tasks assessed                                 General Comments: Flat affect, able to follow verbal prompts.    General Comments       Exercises Other Exercises Other Exercises: Pt instructed in PLB as ECS strategy during functional activity. Able to return demonstrate technique in order to maintain stable 02  sats. Other Exercises: Pt instructed in bed mobility techniques including hand placement, LE mgmt, and trunk mgmt when moving from sup<>sit in bed. Pt endorsed dizziness with activity on this date.    Shoulder Instructions      Home Living Family/patient expects to be discharged to:: Private residence Living Arrangements: Children(Adult son) Available Help at Discharge: Family;Available PRN/intermittently   Home Access: Ramped entrance     Home Layout: One level     Bathroom Shower/Tub: Occupational psychologist: Handicapped height     Home Equipment: Walker - 2 wheels;Hand held shower head;Shower seat - built in          Prior Functioning/Environment Level of Independence: Needs assistance  Gait / Transfers Assistance Needed: Pt endorsed using a RW for mobility within the home. Not a community ambulator.  ADL's / Homemaking Assistance Needed: Endorses being able to dress and bathe with minimal assistance. Son Boykin Reaper with shopping, cooking, and other IADL tasks.    Comments: Alone during the day when son is at work.         OT Problem List: Decreased strength;Impaired balance (sitting and/or standing);Decreased knowledge of precautions;Decreased activity tolerance;Decreased coordination;Decreased knowledge of use of DME or AE;Decreased safety awareness;Impaired UE functional use      OT Treatment/Interventions: Self-care/ADL training;Therapeutic exercise;Therapeutic activities;Energy conservation;DME and/or AE instruction;Patient/family education;Balance training    OT Goals(Current goals can be found in the care plan section) Acute Rehab OT  Goals Patient Stated Goal: To go home and be as independent as possible.  OT Goal Formulation: With patient Time For Goal Achievement: 07/07/18 Potential to Achieve Goals: Good ADL Goals Pt Will Perform Grooming: sitting;with set-up;with supervision(With LRAD/DME for improved functional independence and safety.) Pt Will  Perform Upper Body Dressing: with set-up;with supervision;with adaptive equipment;sitting(With LRAD/DME for improved functional independence and safety.) Pt Will Perform Lower Body Dressing: with set-up;with supervision;with adaptive equipment;sit to/from stand(With LRAD/DME for improved functional independence and safety.) Pt Will Transfer to Toilet: with supervision;ambulating;bedside commode(With LRAD/DME for improved functional independence and safety.) Additional ADL Goal #1: Pt will independently verbalize plan to implement at least two learned ECS into her daily routine for improved functional independence and return to meaningful occupations of daily life.  OT Frequency: Min 1X/week   Barriers to D/C: Decreased caregiver support  Pt alone during the days while son is at work.        Co-evaluation              AM-PAC OT "6 Clicks" Daily Activity     Outcome Measure Help from another person eating meals?: A Little Help from another person taking care of personal grooming?: A Little Help from another person toileting, which includes using toliet, bedpan, or urinal?: A Lot Help from another person bathing (including washing, rinsing, drying)?: A Lot Help from another person to put on and taking off regular upper body clothing?: A Little Help from another person to put on and taking off regular lower body clothing?: A Lot 6 Click Score: 15   End of Session Equipment Utilized During Treatment: Oxygen(Pt on 2L 02 at beginning/end of session. ) Nurse Communication: Other (comment)(O2 sats with activity. )  Activity Tolerance: Patient tolerated treatment well Patient left: in bed;with call bell/phone within reach;with bed alarm set;with family/visitor present  OT Visit Diagnosis: Muscle weakness (generalized) (M62.81);Other abnormalities of gait and mobility (R26.89);Dizziness and giddiness (R42)                Time: 5784-6962 OT Time Calculation (min): 23 min Charges:  OT  General Charges $OT Visit: 1 Visit OT Evaluation $OT Eval Low Complexity: 1 Low OT Treatments $Self Care/Home Management : 8-22 mins  Shara Blazing, M.S., OTR/L Ascom: [phone] 06/23/18, 3:37 PM

## 2018-06-23 NOTE — Progress Notes (Signed)
PT Cancellation Note  Patient Details Name: Lisa Haney MRN: [id number] DOB: 30-Jan-1927   Cancelled Treatment:    Reason Eval/Treat Not Completed: Medical issues which prohibited therapy(Consult received and chart reviewed.  Evaluation held per RN request due to BP concerns/hypertension.  Will re-attempt at later time/date as medically appropriate and available.)   [name], PT, DPT, NCS 06/23/18, 9:51 AM [phone]

## 2018-06-23 NOTE — ED Notes (Signed)
Lorriane Shire, RN completed [facility] swallow screen.

## 2018-06-23 NOTE — Progress Notes (Signed)
*  PRELIMINARY RESULTS* Echocardiogram 2D Echocardiogram has been performed.  Lisa Haney 06/23/2018, 11:51 AM

## 2018-06-24 LAB — BASIC METABOLIC PANEL
Anion gap: 6 (ref 5–15)
BUN: 20 mg/dL (ref 8–23)
CO2: 24 mmol/L (ref 22–32)
CREATININE: 1.01 mg/dL — AB (ref 0.44–1.00)
Calcium: 9 mg/dL (ref 8.9–10.3)
Chloride: 102 mmol/L (ref 98–111)
GFR calc Af Amer: 57 mL/min — ABNORMAL LOW (ref >60)
GFR calc non Af Amer: 49 mL/min — ABNORMAL LOW (ref >60)
Glucose, Bld: 101 mg/dL — ABNORMAL HIGH (ref 70–99)
Potassium: 4.2 mmol/L (ref 3.5–5.1)
SODIUM: 132 mmol/L — AB (ref 135–145)

## 2018-06-24 MED ORDER — HYDRALAZINE HCL 25 MG PO TABS: 25.0000 mg | ORAL_TABLET | Freq: Three times a day (TID) | ORAL | 2 refills | Status: DC

## 2018-06-24 MED ORDER — AMLODIPINE BESYLATE 10 MG PO TABS: 10.0000 mg | ORAL_TABLET | Freq: Every day | ORAL | 2 refills | Status: DC

## 2018-06-24 MED ORDER — SENNOSIDES-DOCUSATE SODIUM 8.6-50 MG PO TABS: 1.0000 | ORAL_TABLET | Freq: Every evening | ORAL | 0 refills | Status: DC | PRN

## 2018-06-24 NOTE — Progress Notes (Signed)
Occupational Therapy Treatment Patient Details Name: Lisa Haney MRN: [id number] DOB: 1927/02/13 Today's Date: 06/24/2018    History of present illness Lisa Haney  is a 85 y.o. female who presented to the ED with chief complaint complaint of headache and AMS.  She is also complained of some expressive aphasia, which was noted to be persistent in the ED with some word finding difficulty, but has generally resolved since admission.  Pt noted to be poor historial, but symptoms likely started between 24 and 48 hours prior to admission.  MRI negative. Pt with elevated BP and acute metabolic encephalopathy-secondary to dehydration and hyponatremia.    OT comments  Pt seen for OT tx this date. Pt pleasant and eager to work with therapist. Pt instructed in bladder mgt strategies, cognitive compensatory strategies, and energy conservation strategies to support pt's safe participation and independence with ADL and IADL while minimizing falls risk and caregiver burden. Pt with poor delayed recall, but with verbal cues able to recall some. Written instructions/recommendations provided to support recall and carryover of learned strategies. Pt continues to benefit from skilled OT services to maximize return to PLOF.   Follow Up Recommendations  SNF    Equipment Recommendations  (TBD)    Recommendations for Other Services      Precautions / Restrictions Precautions Precautions: Fall Restrictions Weight Bearing Restrictions: No       Mobility Bed Mobility  Transfers    Balance                           ADL either performed or assessed with clinical judgement   ADL Overall ADL's : Needs assistance/impaired                                       General ADL Comments: min-mod A for LB ADL     Vision Baseline Vision/History: Wears glasses Wears Glasses: Reading only Patient Visual Report: No change from baseline     Perception     Praxis       Cognition Arousal/Alertness: Awake/alert Behavior During Therapy: WFL for tasks assessed/performed Overall Cognitive Status: No family/caregiver present to determine baseline cognitive functioning                                 General Comments: poor delayed recall        Exercises Other Exercises Other Exercises: pt instructed in bladder mgt strategies to minimize risk of bladder incontinence and UTIs Other Exercises: pt instructed in cognitive compensatory strategies to support recall of important events Other Exercises: pt instructed in energy conservation strategies for ADL and IADL tasks to support independence and minimize falls risk and over exertion   Shoulder Instructions       General Comments     Pertinent Vitals/ Pain       Pain Assessment: Faces Pain Score: 5  Faces Pain Scale: Hurts a little bit Pain Location: pt reports hip and low back pain "not real bad but I can feel it" Pain Descriptors / Indicators: Aching Pain Intervention(s): Limited activity within patient's tolerance;Monitored during session;Premedicated before session  Home Living Family/patient expects to be discharged to:: Private residence Living Arrangements: Children Available Help at Discharge: Family;Available PRN/intermittently Type of Home: House Home Access: Ramped entrance(steep ramp )     Home  Layout: One level     Bathroom Shower/Tub: Occupational psychologist: Handicapped height Bathroom Accessibility: No   Home Equipment: Environmental consultant - 2 wheels;Bedside commode;Toilet riser;Grab bars - tub/shower;Hand held shower head;Wheelchair - power;Shower seat - built in          Prior Functioning/Environment Level of Independence: Needs assistance  Gait / Transfers Assistance Needed: Pt reports she was a household distance ambulator PTA with use of RW. She is no longer currently driving and has not had any falls in the past 6 months. ADL's / Homemaking Assistance  Needed: Pt reports PTA she was able to bathe and dress indepdently. Pt was able to perform light cooking and cleaning but required assistance from son.       Frequency  Min 1X/week        Progress Toward Goals  OT Goals(current goals can now be found in the care plan section)  Progress towards OT goals: Progressing toward goals  Acute Rehab OT Goals Patient Stated Goal: to go home and be independdent  OT Goal Formulation: With patient Time For Goal Achievement: 07/07/18 Potential to Achieve Goals: Good  Plan Discharge plan remains appropriate;Frequency remains appropriate    Co-evaluation                 AM-PAC OT "6 Clicks" Daily Activity     Outcome Measure   Help from another person eating meals?: None Help from another person taking care of personal grooming?: A Little Help from another person toileting, which includes using toliet, bedpan, or urinal?: A Lot Help from another person bathing (including washing, rinsing, drying)?: A Lot Help from another person to put on and taking off regular upper body clothing?: A Little Help from another person to put on and taking off regular lower body clothing?: A Lot 6 Click Score: 16    End of Session Equipment Utilized During Treatment: Oxygen  OT Visit Diagnosis: Muscle weakness (generalized) (M62.81);Other abnormalities of gait and mobility (R26.89);Dizziness and giddiness (R42)   Activity Tolerance Patient tolerated treatment well   Patient Left with call bell/phone within reach;in chair;with chair alarm set   Nurse Communication          Time: 3212-2482 OT Time Calculation (min): 40 min  Charges: OT General Charges $OT Visit: 1 Visit OT Treatments $Self Care/Home Management : 23-37 mins $Therapeutic Activity: 8-22 mins  Jeni Salles, MPH, MS, OTR/L ascom [phone] 06/24/18, 3:52 PM

## 2018-06-24 NOTE — Progress Notes (Signed)
PT Cancellation Note  Patient Details Name: Lisa Haney MRN: [id number] DOB: June 09, 1926   Cancelled Treatment:    Reason Eval/Treat Not Completed: Medical issues which prohibited therapy. RN reported pt's SBP in 190's and pt with headache. RN states she will give pt BP medication and it is best for therapy to come back later. Will continue to follow pt acutely and treat when BP controlled.   Dorothy Spark, SPT  06/24/2018, 9:22 AM

## 2018-06-24 NOTE — Evaluation (Signed)
Physical Therapy Evaluation Patient Details Name: Lisa Haney MRN: [id number] DOB: 09/08/1926 Today's Date: 06/24/2018   History of Present Illness  Lisa Haney  is a 85 y.o. female who presented to the ED with chief complaint complaint of headache and AMS.  She is also complained of some expressive aphasia, which was noted to be persistent in the ED with some word finding difficulty, but has generally resolved since admission.  Pt noted to be poor historial, but symptoms likely started between 24 and 48 hours prior to admission.  MRI negative. Pt with elevated BP and acute metabolic encephalopathy-secondary to dehydration and hyponatremia.     Clinical Impression  [name] admitted for the above and presents with the following deficits listed below (see PT problem list). Pt lives with her son who works and isnt able to provide 24/7 care. Pt on 2L O2 throughout session and SpO2 remained at or above 97%. Min guard was provided for bed mobility. Min guard for STS, pt demonstrated rocking motion prior to completing. Upon initial STS pt reported medium dizziness and difficulty focusing vision, SPT instructed to sit and dizziness went away. Min A provided for gait as pt ambulated 15 ft and had LOB. At 10 ft pt reported medium dizziness and was instructed to sit. BP monitored throughout (see general comments). Upon discharge SPT recommending SNF due to unsteadiness with gait, muscle weakness, dizziness, and decreased support at home.     Follow Up Recommendations SNF;Supervision for mobility/OOB    Equipment Recommendations  Other (comment)(TBD at next venue )    Recommendations for Other Services       Precautions / Restrictions Precautions Precautions: Fall Precaution Comments: BP, telemetry, O2  Restrictions Weight Bearing Restrictions: No      Mobility  Bed Mobility Overal bed mobility: Needs Assistance Bed Mobility: Supine to Sit     Supine to sit: Min guard     General  bed mobility comments: Pt on 2L O2. Pt able to complete sup>sit with min guard.   Transfers Overall transfer level: Needs assistance Equipment used: Rolling walker (2 wheeled) Transfers: Sit to/from Stand Sit to Stand: Min guard         General transfer comment: On 2L O2. Pt performed rocking motion in order to complete STS. SPT Min guard. Pt completed STS x2.  Ambulation/Gait Ambulation/Gait assistance: Min assist Gait Distance (Feet): 15 Feet Assistive device: Rolling walker (2 wheeled) Gait Pattern/deviations: Decreased step length - right;Decreased step length - left;Step-through pattern;Trunk flexed Gait velocity: decreased    General Gait Details: On 2L O2. Pt continuously looked at feet despite cuing to look forward. Pt demonstrated poor RW management as she tended to let walker get too far ahead of her. Min A provided with LOB at 15 ft. At which point she reported feeling dizzy and was instructed to sit. BP in recliner 122/68, pt reported dizziness dissipated following rest break.   Stairs            Wheelchair Mobility    Modified Rankin (Stroke Patients Only)       Balance Overall balance assessment: Needs assistance Sitting-balance support: Bilateral upper extremity supported;Feet supported Sitting balance-Leahy Scale: Poor Sitting balance - Comments: Pt required BUE support to remain steady.   Standing balance support: Bilateral upper extremity supported;During functional activity Standing balance-Leahy Scale: Poor Standing balance comment: Pt utilized BUE of RW to maintain static and dynamic balance.  Pertinent Vitals/Pain Pain Assessment: Faces Pain Score: 5  Faces Pain Scale: Hurts a little bit Pain Location: pt reports hip and low back pain "not real bad but I can feel it" Pain Descriptors / Indicators: Aching Pain Intervention(s): Limited activity within patient's tolerance;Monitored during  session;Premedicated before session    Home Living Family/patient expects to be discharged to:: Private residence Living Arrangements: Children Available Help at Discharge: Family;Available PRN/intermittently Type of Home: House Home Access: Ramped entrance(steep ramp )     Home Layout: One level Home Equipment: Walker - 2 wheels;Bedside commode;Toilet riser;Grab bars - tub/shower;Hand held shower head;Wheelchair - power;Shower seat - built in      Prior Function Level of Independence: Needs assistance   Gait / Transfers Assistance Needed: Pt reports she was a household distance ambulator PTA with use of RW. She is no longer currently driving and has not had any falls in the past 6 months.  ADL's / Homemaking Assistance Needed: Pt reports PTA she was able to bathe and dress indepdently. Pt was able to perform light cooking and cleaning but required assistance from son.        Hand Dominance   Dominant Hand: Right    Extremity/Trunk Assessment   Upper Extremity Assessment Upper Extremity Assessment: RUE deficits/detail;LUE deficits/detail RUE Deficits / Details: strength grossly a 3+/5. Sensation not formally tested but pt reports R and L feel the same with MMT. LUE Deficits / Details: strength grossly a 3+/5. Sensation not formally tested but pt reports R and L feel the same with MMT.    Lower Extremity Assessment Lower Extremity Assessment: RLE deficits/detail;LLE deficits/detail RLE Deficits / Details: Strength grossly 3+/5. Pt able to perform SLR without physical assist.  LLE Deficits / Details: Strength grossly 3+/5. Pt able to perform SLR without physical assist.     Cervical / Trunk Assessment Cervical / Trunk Assessment: Kyphotic  Communication   Communication: HOH  Cognition Arousal/Alertness: Awake/alert Behavior During Therapy: WFL for tasks assessed/performed Overall Cognitive Status: No family/caregiver present to determine baseline cognitive  functioning Area of Impairment: Memory;Safety/judgement;Awareness;Problem solving                     Memory: Decreased short-term memory   Safety/Judgement: Decreased awareness of safety Awareness: Emergent Problem Solving: Slow processing;Decreased initiation;Requires verbal cues;Requires tactile cues;Difficulty sequencing General Comments: poor delayed recall      General Comments General comments (skin integrity, edema, etc.): Pt on 2L O2 throughout session and SpO2 remained at or above 97%. BP in supine 120/48, in sitting at EOB 133/57, initial STS 144/107, following ambulation in sitting 122/68. Pt reported medium dizziness with initial STS and difficulty focusing vision. Pt instructed to sit at EOB. Pt was able to accurately follow SPT finger without head movements. Dizziness decreased and pt performed second STS. Pt ambulated 10 feet and reported medium dizziness, no dizziness following seated rest break.     Exercises Other Exercises Other Exercises: pt instructed in bladder mgt strategies to minimize risk of bladder incontinence and UTIs Other Exercises: pt instructed in cognitive compensatory strategies to support recall of important events Other Exercises: pt instructed in energy conservation strategies for ADL and IADL tasks to support independence and minimize falls risk and over exertion   Assessment/Plan    PT Assessment Patient needs continued PT services  PT Problem List Decreased strength;Decreased range of motion;Decreased activity tolerance;Decreased balance;Decreased mobility;Decreased cognition;Decreased knowledge of use of DME;Decreased safety awareness;Decreased knowledge of precautions;Cardiopulmonary status limiting activity  PT Treatment Interventions DME instruction;Gait training;Functional mobility training;Therapeutic activities;Therapeutic exercise;Balance training;Neuromuscular re-education;Cognitive remediation;Patient/family  education;Wheelchair mobility training    PT Goals (Current goals can be found in the Care Plan section)  Acute Rehab PT Goals Patient Stated Goal: to go home and be independdent  PT Goal Formulation: With patient Time For Goal Achievement: 07/08/18 Potential to Achieve Goals: Fair    Frequency Min 2X/week   Barriers to discharge Decreased caregiver support Pt lives with son who works and is unable to provide care for 8-10 hours.     Co-evaluation               AM-PAC PT "6 Clicks" Mobility  Outcome Measure Help needed turning from your back to your side while in a flat bed without using bedrails?: A Little Help needed moving from lying on your back to sitting on the side of a flat bed without using bedrails?: A Little Help needed moving to and from a bed to a chair (including a wheelchair)?: A Little Help needed standing up from a chair using your arms (e.g., wheelchair or bedside chair)?: A Little Help needed to walk in hospital room?: A Little Help needed climbing 3-5 steps with a railing? : A Lot 6 Click Score: 17    End of Session Equipment Utilized During Treatment: Gait belt;Oxygen Activity Tolerance: Other (comment);Patient limited by fatigue(pt limited by dizziness ) Patient left: in chair;with call bell/phone within reach;with family/visitor present Nurse Communication: Mobility status;Other (comment)(pt without chair alarm box in room ) PT Visit Diagnosis: Other abnormalities of gait and mobility (R26.89);Unsteadiness on feet (R26.81);Muscle weakness (generalized) (M62.81);Dizziness and giddiness (R42);Pain Pain - Right/Left: (bilateral ) Pain - part of body: Leg    Time: 2263-3354 PT Time Calculation (min) (ACUTE ONLY): 27 min   Charges:   PT Evaluation $PT Eval Low Complexity: 1 Low PT Treatments $Gait Training: 8-22 mins     Dorothy Spark, SPT  06/24/2018, 4:44 PM

## 2018-06-24 NOTE — Clinical Social Work Note (Signed)
Clinical Social Work Assessment  Patient Details  Name: Lisa Haney MRN: [id number] Date of Birth: 10-18-1926  Date of referral:  06/24/18               Reason for consult:  Facility Placement                Permission sought to share information with:  Facility Sport and exercise psychologist, Tourist information centre manager, Family Supports Permission granted to share information::  Yes, Verbal Permission Granted  Name::      SNF  Agency::   [facility]   Relationship::     Contact Information:     Housing/Transportation Living arrangements for the past 2 months:  [place] of Information:  Patient Patient Interpreter Needed:  None Criminal Activity/Legal Involvement Pertinent to Current Situation/Hospitalization:  No - Comment as needed Significant Relationships:  Adult Children Lives with:  Adult Children Do you feel safe going back to the place where you live?  Yes Need for family participation in patient care:  Yes (Comment)  Care giving concerns:  Patient lives with her adult son    Facilities manager / plan:  CSW consulted for SNF placement. CSW met with patient to discuss discharge plan. Patient reports that she is usually independent and lives with her son. CSW explained PT recommendation of SNF. Patient states that she has been to [facility] and [facility] in the past. Patient is agreeable to SNF and her preference is [facility]. CSW will begin bed search and give bed offers once received. CSW will continue to follow for discharge planning.   Employment status:  Retired Forensic scientist:  Medicare PT Recommendations:  [place] / Referral to community resources:  [place]  Patient/Family's Response to care:  Patient thanked CSW for assistance.   Patient/Family's Understanding of and Emotional Response to Diagnosis, Current Treatment, and Prognosis:  Patient in agreement with discharge plan   Emotional  Assessment Appearance:  Appears stated age Attitude/Demeanor/Rapport:    Affect (typically observed):  Accepting, Pleasant Orientation:  Oriented to Self, Oriented to Place, Oriented to  Time, Oriented to Situation Alcohol / Substance use:  Not Applicable Psych involvement (Current and /or in the community):  No (Comment)  Discharge Needs  Concerns to be addressed:  Discharge Planning Concerns Readmission within the last 30 days:  Yes Current discharge risk:  Dependent with Mobility Barriers to Discharge:  Continued Medical Work up   [employer], [place] 06/24/2018, 5:24 PM

## 2018-06-24 NOTE — Progress Notes (Signed)
[facility] [place] NAME: Lisa Haney    MR#:  [id number]  DATE OF BIRTH:  May 05, 1926  SUBJECTIVE:  CHIEF COMPLAINT:   Chief Complaint  Patient presents with  . Headache   -Blood pressure is better controlled.  Awaiting physical therapy input. -Denies any other complaints  REVIEW OF SYSTEMS:  Review of Systems  Constitutional: Negative for chills and fever.  HENT: Negative for congestion, ear discharge, hearing loss and nosebleeds.   Eyes: Negative for blurred vision and double vision.  Respiratory: Negative for cough, shortness of breath and wheezing.   Cardiovascular: Negative for chest pain and palpitations.  Gastrointestinal: Negative for abdominal pain, constipation, diarrhea, nausea and vomiting.  Genitourinary: Negative for dysuria.  Musculoskeletal: Negative for myalgias.  Neurological: Negative for dizziness, focal weakness, seizures, weakness and headaches.  Psychiatric/Behavioral: Negative for depression.    DRUG ALLERGIES:   Allergies  Allergen Reactions  . Doxycycline Other (See Comments)    Severe upset stomach and pain     VITALS:  Blood pressure (!) 143/50, pulse 79, temperature (!) 97.4 F (36.3 C), temperature source Oral, resp. rate 18, height 5\' 3"  (1.6 m), weight 90.7 kg, SpO2 100 %.  PHYSICAL EXAMINATION:  Physical Exam   GENERAL:  85 y.o.-year-old elderly patient lying in the bed with no acute distress.  EYES: Pupils equal, round, reactive to light and accommodation. No scleral icterus. Extraocular muscles intact.  HEENT: Head atraumatic, normocephalic. Oropharynx and nasopharynx clear.  NECK:  Supple, no jugular venous distention. No thyroid enlargement, no tenderness.  LUNGS: Normal breath sounds bilaterally, no wheezing, rales,rhonchi or crepitation. No use of accessory muscles of respiration. Decreased bibasilar breath sounds CARDIOVASCULAR: S1, S2 normal. No  rubs, or gallops. 2/6 systolic  murmur present ABDOMEN: Soft, nontender, nondistended. Bowel sounds present. No organomegaly or mass.  EXTREMITIES: No pedal edema, cyanosis, or clubbing.  NEUROLOGIC: Cranial nerves II through XII are intact. Muscle strength 5/5 in all extremities. Sensation intact. Gait not checked.  PSYCHIATRIC: The patient is alert and oriented x 3.  SKIN: No obvious rash, lesion, or ulcer.    LABORATORY PANEL:   CBC Recent Labs  Lab 06/22/18 1850  WBC 7.3  HGB 13.5  HCT 40.7  PLT 181   ------------------------------------------------------------------------------------------------------------------  Chemistries  Recent Labs  Lab 06/21/18 1942  06/24/18 0508  NA 128*   < > 132*  K 4.6   < > 4.2  CL 94*   < > 102  CO2 24   < > 24  GLUCOSE 108*   < > 101*  BUN 33*   < > 20  CREATININE 1.26*   < > 1.01*  CALCIUM 9.0   < > 9.0  AST 22  --   --   ALT 13  --   --   ALKPHOS 81  --   --   BILITOT 0.5  --   --    < > = values in this interval not displayed.   ------------------------------------------------------------------------------------------------------------------  Cardiac Enzymes No results for input(s): TROPONINI in the last 168 hours. ------------------------------------------------------------------------------------------------------------------  RADIOLOGY:  No results found.  EKG:   Orders placed or performed during the hospital encounter of 06/21/18  . EKG 12-Lead  . EKG 12-Lead    ASSESSMENT AND PLAN:   85 year old female with past medical history significant for COPD, CAD, hypertension, stroke presents to hospital secondary to confusion  1.  Acute metabolic encephalopathy-secondary to dehydration and hyponatremia -Confusion resolved -MRI  of the brain on admission did not reveal any acute infarcts.  Stroke work-up has been done and she has been ruled out for new stroke. -Received IV fluids for hyponatremia -Sodium is at 132. -Patient does have severe  intracranial atherosclerotic disease.  Her blood pressure fluctuations or straining can cause dizziness and confusion  2.  Hypertensive urgency-significantly elevated blood pressures. -Increase Norvasc, continue lisinopril.  Also added oral hydralazine. -Blood pressure is improved  3.  Pulmonary hypertension-continue Revatio  4.  Depression anxiety-on high-dose Effexor  5.  DVT prophylaxis-Lovenox  Physical therapy consult is pending Updated grandson at bedside   All the records are reviewed and case discussed with Care Management/Social Workerr. Management plans discussed with the patient, family and they are in agreement.  CODE STATUS: Full Code  TOTAL TIME TAKING CARE OF THIS PATIENT: 38 minutes.   POSSIBLE D/C IN 1-2 DAYS, DEPENDING ON CLINICAL CONDITION.   Gladstone Lighter M.D on 06/24/2018 at 2:19 PM  Between 7am to 6pm - Pager - [phone]  After 6pm go to [email or web address] - password [email or web address] [facility]  Office  [phone]  CC: Primary care physician; Glendon Axe, MD

## 2018-06-24 NOTE — Care Management (Signed)
Confirmed with [facility] that patient has nocturnal oxygen.  At present, discharge disposition is to skilled nursing facility. If patient discharges home instead, will need a home oxygen assessment

## 2018-06-24 NOTE — NC FL2 (Signed)
[place] LEVEL OF CARE SCREENING TOOL     IDENTIFICATION  Patient Name: Lisa Haney Birthdate: [birth year] Sex: female Admission Date (Current Location): 06/21/2018  [place] and [place] Number:  Engineering geologist and Address:  [facility], [address], [place], [place] [address]      Provider Number: [id number]  Attending Physician Name and Address:  Gladstone Lighter, MD  Relative Name and Phone Number:       Current Level of Care: Hospital Recommended Level of Care: [place] Prior Approval Number:    Date Approved/Denied:   PASRR Number: [id number]  Discharge Plan: SNF    Current Diagnoses: Patient Active Problem List   Diagnosis Date Noted  . HTN (hypertension) 06/22/2018  . CAD (coronary artery disease) 06/22/2018  . Aphasia 06/22/2018  . Hypertensive urgency 06/11/2018  . Pneumonia 05/04/2017  . COPD (chronic obstructive pulmonary disease) ([place]) 02/13/2016    Orientation RESPIRATION BLADDER Height & Weight     Self, Time, Place  O2(2 liters ) Continent Weight: 200 lb (90.7 kg) Height:  5\' 3"  (160 cm)  BEHAVIORAL SYMPTOMS/MOOD NEUROLOGICAL BOWEL NUTRITION STATUS  (none) (none) Continent Diet(Heart Healthy )  AMBULATORY STATUS COMMUNICATION OF NEEDS Skin   Extensive Assist Verbally Normal                       Personal Care Assistance Level of Assistance  Bathing, Feeding, Dressing Bathing Assistance: Limited assistance Feeding assistance: Independent Dressing Assistance: Limited assistance     Functional Limitations Info  Sight, Hearing, Speech Sight Info: Adequate Hearing Info: Adequate Speech Info: Adequate    SPECIAL CARE FACTORS FREQUENCY  PT (By licensed PT), OT (By licensed OT), Speech therapy     PT Frequency: 5 OT Frequency: 5            Contractures Contractures Info: Not present    Additional Factors Info  Code Status, Allergies Code Status Info: Full  Code  Allergies Info: Doxycycline           Current Medications (06/24/2018):  This is the current hospital active medication list Current Facility-Administered Medications  Medication Dose Route Frequency Provider Last Rate Last Dose  . acetaminophen (TYLENOL) tablet 650 mg  650 mg Oral Q4H PRN Lance Coon, MD   650 mg at 06/24/18 4128   Or  . acetaminophen (TYLENOL) solution 650 mg  650 mg Per Tube Q4H PRN Lance Coon, MD       Or  . acetaminophen (TYLENOL) suppository 650 mg  650 mg Rectal Q4H PRN Lance Coon, MD      . amLODipine (NORVASC) tablet 10 mg  10 mg Oral Daily Gladstone Lighter, MD   10 mg at 06/24/18 7867  . aspirin chewable tablet 81 mg  81 mg Oral Daily Gladstone Lighter, MD   81 mg at 06/24/18 6720  . cholecalciferol (VITAMIN D3) tablet 1,000 Units  1,000 Units Oral Daily Gladstone Lighter, MD   1,000 Units at 06/24/18 0810  . enoxaparin (LOVENOX) injection 40 mg  40 mg Subcutaneous Q24H Lance Coon, MD   40 mg at 06/23/18 2109  . hydrALAZINE (APRESOLINE) tablet 25 mg  25 mg Oral Q8H Gladstone Lighter, MD   25 mg at 06/24/18 0542  . labetalol (NORMODYNE,TRANDATE) injection 5-10 mg  5-10 mg Intravenous Q2H PRN Gladstone Lighter, MD   5 mg at 06/24/18 0830  . levothyroxine (SYNTHROID, LEVOTHROID) tablet 100 mcg  100 mcg Oral QAC breakfast Kalisetti,  Hart Rochester, MD   100 mcg at 06/24/18 0543  . lisinopril (PRINIVIL,ZESTRIL) tablet 40 mg  40 mg Oral Daily Gladstone Lighter, MD   40 mg at 06/24/18 0813  . Melatonin TABS 5 mg  5 mg Oral Q2000 Gladstone Lighter, MD   5 mg at 06/23/18 2105  . morphine 2 MG/ML injection 0.5-1 mg  0.5-1 mg Intravenous Q4H PRN Gladstone Lighter, MD   1 mg at 06/22/18 1336  . polyethylene glycol (MIRALAX / GLYCOLAX) packet 17 g  17 g Oral Daily Gladstone Lighter, MD   17 g at 06/22/18 1639  . pravastatin (PRAVACHOL) tablet 40 mg  40 mg Oral Q2000 Gladstone Lighter, MD   40 mg at 06/23/18 2100  . senna-docusate (Senokot-S) tablet 1  tablet  1 tablet Oral BID Gladstone Lighter, MD   1 tablet at 06/24/18 [phone]  . sildenafil (REVATIO) tablet 60 mg  60 mg Oral TID Gladstone Lighter, MD   60 mg at 06/24/18 1703  . traZODone (DESYREL) tablet 50 mg  50 mg Oral QHS PRN Lance Coon, MD      . venlafaxine XR [facility]) 24 hr capsule 150 mg  150 mg Oral Daily Gladstone Lighter, MD   150 mg at 06/24/18 0813  . venlafaxine XR (EFFEXOR-XR) 24 hr capsule 75 mg  75 mg Oral Daily Gladstone Lighter, MD   75 mg at 06/22/18 1600     Discharge Medications: Please see discharge summary for a list of discharge medications.  Relevant Imaging Results:  Relevant Lab Results:   Additional Information SSN: [id number]  Annamaria Boots, [place]

## 2018-06-25 NOTE — Progress Notes (Signed)
Occupational Therapy Treatment Patient Details Name: Lisa Haney MRN: [id number] DOB: [birth year] Today's Date: 06/25/2018    History of present illness Lisa Haney  is a 85 y.o. female who presented to the ED with chief complaint complaint of headache and AMS.  She is also complained of some expressive aphasia, which was noted to be persistent in the ED with some word finding difficulty, but has generally resolved since admission.  Pt noted to be poor historial, but symptoms likely started between 24 and 48 hours prior to admission.  MRI negative. Pt with elevated BP and acute metabolic encephalopathy-secondary to dehydration and hyponatremia.    OT comments  Pt seen for OT tx this date. Pt eager to work with the therapist. Son present. Pt endorsed mild lightheadedness/whooziness throughout session, worsening slightly upon initial sitting EOB from supine, but improving near end of session with continued EOB activity. O2 sats on 2L O2 >95% throughout session. Pt performed sup<>sit with supervision. Sat EOB to perform grooming tasks with set up. Performed seated ther ex BLE/BUE to improve act tol and sitting balance for ADL tasks. Lateral scoots EOB with CGA to improve positioning prior to return to bed at end of session. Pt continues to benefit from skilled OT services. Continue to recommend STR.   Follow Up Recommendations  SNF    Equipment Recommendations  (TBD)    Recommendations for Other Services      Precautions / Restrictions Precautions Precautions: Fall Precaution Comments: BP, telemetry, O2  Restrictions Weight Bearing Restrictions: No       Mobility Bed Mobility Overal bed mobility: Needs Assistance Bed Mobility: Supine to Sit;Sit to Supine     Supine to sit: Supervision;HOB elevated Sit to supine: Supervision   General bed mobility comments: supervision, no difficulty  Transfers Overall transfer level: Needs assistance Equipment used: None Transfers:  Lateral/Scoot Transfers          Lateral/Scoot Transfers: Min guard General transfer comment: able to laterally scoot with lifting hips off bed briefly with CGA    Balance Overall balance assessment: Needs assistance Sitting-balance support: Feet supported;No upper extremity supported Sitting balance-Leahy Scale: Fair                                     ADL either performed or assessed with clinical judgement   ADL Overall ADL's : Needs assistance/impaired     Grooming: Set up;Supervision/safety Grooming Details (indicate cue type and reason): pt tolerated sitting EOB to comb hair with supervision, no LOB, no SOB noted. Initially pt reports lightheaded/wooziness worsened upon sitting from supine but then improved some (still present)             Lower Body Dressing: Sitting/lateral leans;Minimal assistance Lower Body Dressing Details (indicate cue type and reason): Min A to thread shoes on feet                     Vision Baseline Vision/History: Wears glasses Wears Glasses: Reading only Patient Visual Report: No change from baseline     Perception     Praxis      Cognition Arousal/Alertness: Awake/alert Behavior During Therapy: WFL for tasks assessed/performed Overall Cognitive Status: Impaired/Different from baseline                                 General Comments: WFL today for  tasks (able to follow commands, alert and oriented); but pt endorses poor recall        Exercises Other Exercises Other Exercises: seated ther ex BLE/BUE to improve act tol and sitting balance for ADL tasks   Shoulder Instructions       General Comments BP supine 159/61 PR 79; BP sit EOB 177/68 PR 81; BP sitting EOB after ther ex 161/55 PR 87; O2 sats on 2L >95% throughout; pt reports mild lightheadedness/whooziness in supine, worsening some with sitting, and after >5 minutes sitting with mild improvement but still having  lightheadedness/whooziness    Pertinent Vitals/ Pain       Pain Assessment: No/denies pain Pain Intervention(s): Monitored during session  Home Living                                          Prior Functioning/Environment              Frequency  Min 1X/week        Progress Toward Goals  OT Goals(current goals can now be found in the care plan section)  Progress towards OT goals: Progressing toward goals  Acute Rehab OT Goals Patient Stated Goal: to go home and be independdent  OT Goal Formulation: With patient Time For Goal Achievement: 07/07/18 Potential to Achieve Goals: Good  Plan Discharge plan remains appropriate;Frequency remains appropriate    Co-evaluation                 AM-PAC OT "6 Clicks" Daily Activity     Outcome Measure   Help from another person eating meals?: None Help from another person taking care of personal grooming?: A Little Help from another person toileting, which includes using toliet, bedpan, or urinal?: A Lot Help from another person bathing (including washing, rinsing, drying)?: A Lot Help from another person to put on and taking off regular upper body clothing?: A Little Help from another person to put on and taking off regular lower body clothing?: A Lot 6 Click Score: 16    End of Session Equipment Utilized During Treatment: Oxygen  OT Visit Diagnosis: Muscle weakness (generalized) (M62.81);Other abnormalities of gait and mobility (R26.89);Dizziness and giddiness (R42)   Activity Tolerance Patient tolerated treatment well   Patient Left with call bell/phone within reach;in bed;with bed alarm set;with family/visitor present   Nurse Communication          Time: 1110-1135 OT Time Calculation (min): 25 min  Charges: OT General Charges $OT Visit: 1 Visit OT Treatments $Self Care/Home Management : 8-22 mins $Therapeutic Exercise: 8-22 mins  Jeni Salles, MPH, MS, OTR/L ascom  [phone] 06/25/18, 1:13 PM

## 2018-06-25 NOTE — Plan of Care (Signed)
  Problem: Education: Goal: Knowledge of disease or condition will improve Outcome: Progressing Goal: Knowledge of secondary prevention will improve Outcome: Progressing   Problem: Self-Care: Goal: Ability to participate in self-care as condition permits will improve Outcome: Progressing Goal: Verbalization of feelings and concerns over difficulty with self-care will improve Outcome: Progressing Goal: Ability to communicate needs accurately will improve Outcome: Progressing   Problem: Nutrition: Goal: Risk of aspiration will decrease Outcome: Progressing   Problem: Pain Managment: Goal: General experience of comfort will improve Outcome: Progressing   Problem: Safety: Goal: Ability to remain free from injury will improve Outcome: Progressing   Problem: Skin Integrity: Goal: Risk for impaired skin integrity will decrease Outcome: Progressing

## 2018-06-25 NOTE — Progress Notes (Signed)
[place] [facility] [place] NAME: Lisa Haney    MR#:  [id number]  DATE OF BIRTH:  Sep 03, 1926  SUBJECTIVE:  CHIEF COMPLAINT:   Chief Complaint  Patient presents with  . Headache   -Doing well, no complaints, blood pressure is improving  REVIEW OF SYSTEMS:  Review of Systems  Constitutional: Negative for chills and fever.  HENT: Negative for congestion, ear discharge, hearing loss and nosebleeds.   Eyes: Negative for blurred vision and double vision.  Respiratory: Negative for cough, shortness of breath and wheezing.   Cardiovascular: Negative for chest pain and palpitations.  Gastrointestinal: Negative for abdominal pain, constipation, diarrhea, nausea and vomiting.  Genitourinary: Negative for dysuria.  Musculoskeletal: Negative for myalgias.  Neurological: Negative for dizziness, focal weakness, seizures, weakness and headaches.  Psychiatric/Behavioral: Negative for depression.    DRUG ALLERGIES:   Allergies  Allergen Reactions  . Doxycycline Other (See Comments)    Severe upset stomach and pain     VITALS:  Blood pressure (!) 177/68, pulse 81, temperature (!) 97.4 F (36.3 C), temperature source Oral, resp. rate 16, height 5\' 3"  (1.6 m), weight 90.7 kg, SpO2 96 %.  PHYSICAL EXAMINATION:  Physical Exam   GENERAL:  85 y.o.-year-old elderly patient lying in the bed with no acute distress.  EYES: Pupils equal, round, reactive to light and accommodation. No scleral icterus. Extraocular muscles intact.  HEENT: Head atraumatic, normocephalic. Oropharynx and nasopharynx clear.  NECK:  Supple, no jugular venous distention. No thyroid enlargement, no tenderness.  LUNGS: Normal breath sounds bilaterally, no wheezing, rales,rhonchi or crepitation. No use of accessory muscles of respiration. Decreased bibasilar breath sounds CARDIOVASCULAR: S1, S2 normal. No  rubs, or gallops. 2/6 systolic murmur present ABDOMEN: Soft, nontender,  nondistended. Bowel sounds present. No organomegaly or mass.  EXTREMITIES: No pedal edema, cyanosis, or clubbing.  NEUROLOGIC: Cranial nerves II through XII are intact. Muscle strength 5/5 in all extremities. Sensation intact. Gait not checked.  PSYCHIATRIC: The patient is alert and oriented x 3.  SKIN: No obvious rash, lesion, or ulcer.    LABORATORY PANEL:   CBC Recent Labs  Lab 06/22/18 1850  WBC 7.3  HGB 13.5  HCT 40.7  PLT 181   ------------------------------------------------------------------------------------------------------------------  Chemistries  Recent Labs  Lab 06/21/18 1942  06/24/18 0508  NA 128*   < > 132*  K 4.6   < > 4.2  CL 94*   < > 102  CO2 24   < > 24  GLUCOSE 108*   < > 101*  BUN 33*   < > 20  CREATININE 1.26*   < > 1.01*  CALCIUM 9.0   < > 9.0  AST 22  --   --   ALT 13  --   --   ALKPHOS 81  --   --   BILITOT 0.5  --   --    < > = values in this interval not displayed.   ------------------------------------------------------------------------------------------------------------------  Cardiac Enzymes No results for input(s): TROPONINI in the last 168 hours. ------------------------------------------------------------------------------------------------------------------  RADIOLOGY:  No results found.  EKG:   Orders placed or performed during the hospital encounter of 06/21/18  . EKG 12-Lead  . EKG 12-Lead    ASSESSMENT AND PLAN:   85 year old female with past medical history significant for COPD, CAD, hypertension, stroke presents to hospital secondary to confusion  1.  Acute metabolic encephalopathy-secondary to dehydration and hyponatremia -Confusion resolved -MRI of the brain on admission did  not reveal any acute infarcts.  Stroke work-up has been done and she has been ruled out for new stroke. -Received IV fluids for hyponatremia-discontinue now -Sodium is at 132. -Patient does have severe intracranial atherosclerotic  disease.  Her blood pressure fluctuations or straining can cause dizziness and confusion  2.  Hypertensive urgency-significantly elevated blood pressures. -Increase Norvasc, continue lisinopril.  Also added oral hydralazine. -Blood pressure is improved  3.  Pulmonary hypertension-continue Revatio  4.  Depression anxiety-on high-dose Effexor  5.  DVT prophylaxis-Lovenox  Physical therapy consult recommended rehab.  Likely discharge tomorrow Updated son at bedside   All the records are reviewed and case discussed with Care Management/Social Workerr. Management plans discussed with the patient, family and they are in agreement.  CODE STATUS: Full Code  TOTAL TIME TAKING CARE OF THIS PATIENT: 38 minutes.   POSSIBLE D/C IN 1-2 DAYS, DEPENDING ON CLINICAL CONDITION.   Gladstone Lighter M.D on 06/25/2018 at 1:30 PM  Between 7am to 6pm - Pager - [phone]  After 6pm go to [email or web address] - password [email or web address] [facility]  Office  [phone]  CC: Primary care physician; Glendon Axe, MD

## 2018-06-26 NOTE — Discharge Summary (Signed)
[facility] NAME: Lisa Haney    MR#:  [id number]  DATE OF BIRTH:  Aug 30, 1926  DATE OF ADMISSION:  06/21/2018   ADMITTING PHYSICIAN: Lance Coon, MD  DATE OF DISCHARGE: 06/26/2018  PRIMARY CARE PHYSICIAN: Glendon Axe, MD   ADMISSION DIAGNOSIS:   Aphasia [R47.01] Hyponatremia [E87.1] Acute ischemic stroke ([place]) [I63.9]  DISCHARGE DIAGNOSIS:   Principal Problem:   Aphasia Active Problems:   COPD (chronic obstructive pulmonary disease) (HCC)   Hypertensive urgency   HTN (hypertension)   CAD (coronary artery disease)   SECONDARY DIAGNOSIS:   Past Medical History:  Diagnosis Date  . Arthritis   . Cancer ([place])    skin cancer on hand  . COPD (chronic obstructive pulmonary disease) ([place])   . Coronary artery disease    stent  . Diverticulitis   . Hypertension   . Stroke [facility])    TIA  . TIA (transient ischemic attack)     HOSPITAL COURSE:   85 year old female with past medical history significant for COPD, CAD, hypertension, stroke presents to hospital secondary to confusion  1.  Acute metabolic encephalopathy-secondary to dehydration and hyponatremia -Confusion resolved -MRI of the brain on admission did not reveal any acute infarcts.  Stroke work-up has been done and she has been ruled out for new stroke. -Received IV fluids for hyponatremia-discontinued now -Sodium is at 132. -Patient does have severe intracranial atherosclerotic disease.  Her blood pressure fluctuations or straining can cause dizziness and confusion  2.  Hypertensive urgency-significantly elevated blood pressures. -Increase Norvasc, continue lisinopril.  Also added oral hydralazine. -Blood pressure is improved -Do not drop her blood pressure too low given her severe intracranial atherosclerotic disease which could cause syncopal episode  3.  Pulmonary hypertension-continue Revatio  4.  Depression anxiety-on high-dose  Effexor  5.  Dizziness on standing-secondary to vasovagal and also significant intracranial atherosclerotic disease causing decreased blood flow.  Get steady before trying to change posture and move   Physical therapy consult recommended rehab.    Left a voicemail to son [name] Will be discharged today  DISCHARGE CONDITIONS:   Guarded  CONSULTS OBTAINED:   Treatment Team:  Catarina Hartshorn, MD  DRUG ALLERGIES:   Allergies  Allergen Reactions  . Doxycycline Other (See Comments)    Severe upset stomach and pain    DISCHARGE MEDICATIONS:   Allergies as of 06/26/2018      Reactions   Doxycycline Other (See Comments)   Severe upset stomach and pain       Medication List    STOP taking these medications   furosemide 40 MG tablet Commonly known as:  LASIX     TAKE these medications   amLODipine 10 MG tablet Commonly known as:  NORVASC Take 1 tablet (10 mg total) by mouth daily. What changed:    medication strength  how much to take   aspirin 81 MG tablet Take 81 mg by mouth daily.   cholecalciferol 1000 units tablet Commonly known as:  VITAMIN D Take 1,000 Units by mouth daily.   hydrALAZINE 25 MG tablet Commonly known as:  APRESOLINE Take 1 tablet (25 mg total) by mouth every 8 (eight) hours.   levothyroxine 100 MCG tablet Commonly known as:  SYNTHROID, LEVOTHROID Take 100 mcg by mouth daily before breakfast.   lisinopril 40 MG tablet Commonly known as:  PRINIVIL,ZESTRIL Take 40 mg by mouth daily.   loratadine 10 MG tablet Commonly known as:  CLARITIN  Take 10 mg by mouth daily at 2 PM.   Melatonin 5 MG Tabs Take 5 mg by mouth daily at 8 pm.   pravastatin 40 MG tablet Commonly known as:  PRAVACHOL Take 40 mg by mouth daily at 8 pm.   senna-docusate 8.6-50 MG tablet Commonly known as:  Senokot-S Take 1 tablet by mouth at bedtime as needed for mild constipation.   sildenafil 20 MG tablet Commonly known as:  REVATIO Take 60 mg by mouth 3  (three) times daily.   venlafaxine XR 75 MG 24 hr capsule Commonly known as:  EFFEXOR-XR Take 75 mg by mouth daily.   venlafaxine XR 150 MG 24 hr capsule Commonly known as:  EFFEXOR-XR Take 150 mg by mouth daily.        DISCHARGE INSTRUCTIONS:   1.  PCP follow-up in 1 to 2 weeks  DIET:   Cardiac diet  ACTIVITY:   Activity as tolerated  OXYGEN:   Home Oxygen: No.  Oxygen Delivery: room air  DISCHARGE LOCATION:   nursing home   If you experience worsening of your admission symptoms, develop shortness of breath, life threatening emergency, suicidal or homicidal thoughts you must seek medical attention immediately by calling 911 or calling your MD immediately  if symptoms less severe.  You Must read complete instructions/literature along with all the possible adverse reactions/side effects for all the Medicines you take and that have been prescribed to you. Take any new Medicines after you have completely understood and accpet all the possible adverse reactions/side effects.   Please note  You were cared for by a hospitalist during your hospital stay. If you have any questions about your discharge medications or the care you received while you were in the hospital after you are discharged, you can call the unit and asked to speak with the hospitalist on call if the hospitalist that took care of you is not available. Once you are discharged, your primary care physician will handle any further medical issues. Please note that NO REFILLS for any discharge medications will be authorized once you are discharged, as it is imperative that you return to your primary care physician (or establish a relationship with a primary care physician if you do not have one) for your aftercare needs so that they can reassess your need for medications and monitor your lab values.    On the day of Discharge:  VITAL SIGNS:   Blood pressure (!) 157/52, pulse 84, temperature (!) 97.5 F (36.4 C),  temperature source Oral, resp. rate 18, height 5\' 3"  (1.6 m), weight 90.7 kg, SpO2 100 %.  PHYSICAL EXAMINATION:   GENERAL:  85 y.o.-year-old elderly patient lying in the bed with no acute distress.  EYES: Pupils equal, round, reactive to light and accommodation. No scleral icterus. Extraocular muscles intact.  HEENT: Head atraumatic, normocephalic. Oropharynx and nasopharynx clear.  NECK:  Supple, no jugular venous distention. No thyroid enlargement, no tenderness.  LUNGS: Normal breath sounds bilaterally, no wheezing, rales,rhonchi or crepitation. No use of accessory muscles of respiration. Decreased bibasilar breath sounds CARDIOVASCULAR: S1, S2 normal. No  rubs, or gallops. 2/6 systolic murmur present ABDOMEN: Soft, nontender, nondistended. Bowel sounds present. No organomegaly or mass.  EXTREMITIES: No pedal edema, cyanosis, or clubbing.  NEUROLOGIC: Cranial nerves II through XII are intact. Muscle strength 5/5 in all extremities. Sensation intact. Gait not checked.  PSYCHIATRIC: The patient is alert and oriented x 3.  SKIN: No obvious rash, lesion, or ulcer  DATA REVIEW:  CBC Recent Labs  Lab 06/22/18 1850  WBC 7.3  HGB 13.5  HCT 40.7  PLT 181    Chemistries  Recent Labs  Lab 06/21/18 1942  06/24/18 0508  NA 128*   < > 132*  K 4.6   < > 4.2  CL 94*   < > 102  CO2 24   < > 24  GLUCOSE 108*   < > 101*  BUN 33*   < > 20  CREATININE 1.26*   < > 1.01*  CALCIUM 9.0   < > 9.0  AST 22  --   --   ALT 13  --   --   ALKPHOS 81  --   --   BILITOT 0.5  --   --    < > = values in this interval not displayed.     Microbiology Results  Results for orders placed or performed during the hospital encounter of 05/14/17  Blood culture (routine x 2)     Status: None   Collection Time: 05/14/17  1:45 PM  Result Value Ref Range Status   Specimen Description BLOOD RAC  Final   Special Requests   Final    BOTTLES DRAWN AEROBIC AND ANAEROBIC Blood Culture results may not be  optimal due to an excessive volume of blood received in culture bottles   Culture   Final    NO GROWTH 5 DAYS Performed at [facility], [address] [place], [place] [address]    Report Status 05/19/2017 FINAL  Final  Urine Culture     Status: Abnormal   Collection Time: 05/14/17  2:20 PM  Result Value Ref Range Status   Specimen Description   Final    URINE, RANDOM Performed at [facility], [address], [place], [place] [address]    Special Requests   Final    NONE Performed at [facility], [address] [place], [place] [address]    Culture >=100,000 COLONIES/mL CITROBACTER FREUNDII (A)  Final   Report Status 05/17/2017 FINAL  Final   Organism ID, Bacteria CITROBACTER FREUNDII (A)  Final      Susceptibility   Citrobacter freundii - MIC*    CEFAZOLIN >=64 RESISTANT Resistant     CEFTRIAXONE >=64 RESISTANT Resistant     CIPROFLOXACIN <=0.25 SENSITIVE Sensitive     GENTAMICIN <=1 SENSITIVE Sensitive     IMIPENEM 1 SENSITIVE Sensitive     NITROFURANTOIN <=16 SENSITIVE Sensitive     TRIMETH/SULFA <=20 SENSITIVE Sensitive     PIP/TAZO >=128 RESISTANT Resistant     * >=100,000 COLONIES/mL CITROBACTER FREUNDII  Blood culture (routine x 2)     Status: None   Collection Time: 05/14/17  3:10 PM  Result Value Ref Range Status   Specimen Description BLOOD RAC  Final   Special Requests   Final    BOTTLES DRAWN AEROBIC AND ANAEROBIC Blood Culture adequate volume   Culture   Final    NO GROWTH 5 DAYS Performed at [facility], [address] [place], [place] [address]    Report Status 05/19/2017 FINAL  Final  MRSA PCR Screening     Status: None   Collection Time: 05/15/17  1:30 PM  Result Value Ref Range Status   MRSA by PCR NEGATIVE NEGATIVE Final    Comment:        The GeneXpert MRSA Assay (FDA approved for NASAL specimens only), is one component of a comprehensive MRSA colonization surveillance program. It  is not intended to  diagnose MRSA infection nor to guide or monitor treatment for MRSA infections. Performed at [facility], [address] [place], [place] [address]     RADIOLOGY:  No results found.   Management plans discussed with the patient, family and they are in agreement.  CODE STATUS:     Code Status Orders  (From admission, onward)         Start     Ordered   06/22/18 1824  Full code  Continuous     06/22/18 1823        Code Status History    Date Active Date Inactive Code Status Order ID Comments User Context   06/11/2018 0412 06/12/2018 1849 Full Code [id number]  Harrie Foreman, MD Inpatient   05/14/2017 1927 05/17/2017 1739 Full Code [id number]  Demetrios Loll, MD Inpatient   05/04/2017 1755 05/09/2017 1507 Full Code [id number]  Fritzi Mandes, MD Inpatient   06/21/2016 0959 06/21/2016 1413 Full Code [id number]  Isaias Cowman, MD Inpatient   02/13/2016 0417 02/15/2016 2021 Full Code [id number]  Hugelmeyer, Ubaldo Glassing, DO Inpatient    Advance Directive Documentation     Most Recent Value  Type of Advance Directive  Out of facility DNR (pink MOST or yellow form)  Pre-existing out of facility DNR order (yellow form or pink MOST form)  Yellow form placed in chart (order not valid for inpatient use)  "MOST" Form in Place?  -      TOTAL TIME TAKING CARE OF THIS PATIENT: 38 minutes.    Gladstone Lighter M.D on 06/26/2018 at 10:56 AM  Between 7am to 6pm - Pager - [phone]  After 6pm go to [email or web address] - Proofreader  [facility]  Office  [phone]  CC: Primary care physician; Glendon Axe, MD   Note: This dictation was prepared with Dragon dictation along with smaller phrase technology. Any transcriptional errors that result from this process are unintentional.

## 2018-06-26 NOTE — Clinical Social Work Note (Signed)
Patient is medically ready for discharge today per MD. CSW notified patient of discharge today to [facility]. Patient is in agreement with discharge today to [facility]. CSW also notified patient's son Divina Neale [phone]. CSW also notified Magda Paganini at [facility] of discharge today. Patient will be transported by EMS. RN to call report and call for transport.   [place], [place]

## 2018-06-26 NOTE — Care Management Important Message (Signed)
Important Message  Patient Details  Name: Lisa Haney MRN: [id number] Date of Birth: 01-22-1927   Medicare Important Message Given:  Yes    [name] Broker 06/26/2018, 10:38 AM

## 2018-06-26 NOTE — Progress Notes (Signed)
Physical Therapy Treatment Patient Details Name: Lisa Haney MRN: [id number] DOB: 05-18-1926 Today's Date: 06/26/2018    History of Present Illness Lisa Haney  is a 85 y.o. female who presented to the ED with chief complaint complaint of headache and AMS.  She is also complained of some expressive aphasia, which was noted to be persistent in the ED with some word finding difficulty, but has generally resolved since admission.  Pt noted to be poor historial, but symptoms likely started between 24 and 48 hours prior to admission.  MRI negative. Pt with elevated BP and acute metabolic encephalopathy-secondary to dehydration and hyponatremia.     PT Comments    Pt in bed, motivated to get up but voiced concerns over her vision being "not right."  Declined dizziness but stated her eyes were having a hard time focusing.  Vitals checked 157/52 P 85 Sats 100% on 2 lpm.  Nurse in to check.  Pt requesting to get up in the recliner in hopes it would make her feel better.  To edge of bed with increased time and head of bed raised.  Once sitting she was steady.  Stood with min guard/assist x 1 to walker where she stood for several moments before hesitantly transferring to recliner at bedside.  She declined further gait and mobility at this time wanting to just sit in the recliner for a while.  She did not have any dizziness upon sitting or standing and attributed vision difficulties to "my ears."   Follow Up Recommendations  SNF     Equipment Recommendations       Recommendations for Other Services       Precautions / Restrictions Precautions Precautions: Fall Precaution Comments: BP, telemetry, O2  Restrictions Weight Bearing Restrictions: No    Mobility  Bed Mobility Overal bed mobility: Needs Assistance Bed Mobility: Supine to Sit     Supine to sit: Supervision;HOB elevated        Transfers Overall transfer level: Needs assistance Equipment used: Rolling walker (2  wheeled) Transfers: Sit to/from Stand Sit to Stand: Min guard;Min assist         General transfer comment: "Let me try it myself"  Ambulation/Gait Ambulation/Gait assistance: Min assist Gait Distance (Feet): 3 Feet Assistive device: Rolling walker (2 wheeled) Gait Pattern/deviations: Step-to pattern Gait velocity: decreased    General Gait Details: hesitant with gait this am.  "Your going to catch me if I fall right?"  No LOB or buckling but pt seemed more cautious today.   Stairs             Wheelchair Mobility    Modified Rankin (Stroke Patients Only)       Balance Overall balance assessment: Needs assistance Sitting-balance support: Feet supported;No upper extremity supported Sitting balance-Leahy Scale: Fair     Standing balance support: Bilateral upper extremity supported;During functional activity Standing balance-Leahy Scale: Poor Standing balance comment: Pt utilized BUE of RW to maintain static and dynamic balance.                             Cognition Arousal/Alertness: Awake/alert Behavior During Therapy: WFL for tasks assessed/performed Overall Cognitive Status: Within Functional Limits for tasks assessed                                        Exercises  General Comments        Pertinent Vitals/Pain Pain Assessment: No/denies pain Pain Intervention(s): Monitored during session    Home Living                      Prior Function            PT Goals (current goals can now be found in the care plan section) Progress towards PT goals: Progressing toward goals    Frequency    Min 2X/week      PT Plan Current plan remains appropriate    Co-evaluation              AM-PAC PT "6 Clicks" Mobility   Outcome Measure  Help needed turning from your back to your side while in a flat bed without using bedrails?: A Little Help needed moving from lying on your back to sitting on the side of  a flat bed without using bedrails?: A Little Help needed moving to and from a bed to a chair (including a wheelchair)?: A Little Help needed standing up from a chair using your arms (e.g., wheelchair or bedside chair)?: A Little Help needed to walk in hospital room?: A Little Help needed climbing 3-5 steps with a railing? : A Lot 6 Click Score: 17    End of Session Equipment Utilized During Treatment: Gait belt;Oxygen   Patient left: in chair;with call bell/phone within reach Nurse Communication: Mobility status;Other (comment) Pain - part of body: Leg     Time: 0850-0906 PT Time Calculation (min) (ACUTE ONLY): 16 min  Charges:  $Therapeutic Activity: 8-22 mins                     Chesley Noon, PTA 06/26/18, 9:36 AM

## 2018-09-16 ENCOUNTER — Inpatient Hospital Stay
Admission: EM | Admit: 2018-09-16 | Discharge: 2018-09-23 | DRG: 291 | Disposition: A | Discharge status: Hospice | Payer: Medicare Other | Attending: Internal Medicine | Admitting: Internal Medicine

## 2018-09-16 ENCOUNTER — Emergency Department: Payer: Medicare Other

## 2018-09-16 ENCOUNTER — Other Ambulatory Visit: Payer: Self-pay

## 2018-09-16 DIAGNOSIS — R0602 Shortness of breath: Secondary | ICD-10-CM

## 2018-09-16 DIAGNOSIS — Z515 Encounter for palliative care: Secondary | ICD-10-CM | POA: Diagnosis not present

## 2018-09-16 DIAGNOSIS — Z8673 Personal history of transient ischemic attack (TIA), and cerebral infarction without residual deficits: Secondary | ICD-10-CM | POA: Diagnosis not present

## 2018-09-16 DIAGNOSIS — Z955 Presence of coronary angioplasty implant and graft: Secondary | ICD-10-CM

## 2018-09-16 DIAGNOSIS — I2721 Secondary pulmonary arterial hypertension: Secondary | ICD-10-CM | POA: Diagnosis present

## 2018-09-16 DIAGNOSIS — E785 Hyperlipidemia, unspecified: Secondary | ICD-10-CM | POA: Diagnosis present

## 2018-09-16 DIAGNOSIS — J440 Chronic obstructive pulmonary disease with acute lower respiratory infection: Secondary | ICD-10-CM | POA: Diagnosis present

## 2018-09-16 DIAGNOSIS — E871 Hypo-osmolality and hyponatremia: Secondary | ICD-10-CM

## 2018-09-16 DIAGNOSIS — J3489 Other specified disorders of nose and nasal sinuses: Secondary | ICD-10-CM | POA: Diagnosis present

## 2018-09-16 DIAGNOSIS — G4733 Obstructive sleep apnea (adult) (pediatric): Secondary | ICD-10-CM | POA: Diagnosis present

## 2018-09-16 DIAGNOSIS — I2510 Atherosclerotic heart disease of native coronary artery without angina pectoris: Secondary | ICD-10-CM | POA: Diagnosis present

## 2018-09-16 DIAGNOSIS — I130 Hypertensive heart and chronic kidney disease with heart failure and stage 1 through stage 4 chronic kidney disease, or unspecified chronic kidney disease: Principal | ICD-10-CM | POA: Diagnosis present

## 2018-09-16 DIAGNOSIS — Z66 Do not resuscitate: Secondary | ICD-10-CM | POA: Diagnosis present

## 2018-09-16 DIAGNOSIS — N179 Acute kidney failure, unspecified: Secondary | ICD-10-CM | POA: Diagnosis present

## 2018-09-16 DIAGNOSIS — Z79890 Hormone replacement therapy (postmenopausal): Secondary | ICD-10-CM

## 2018-09-16 DIAGNOSIS — E039 Hypothyroidism, unspecified: Secondary | ICD-10-CM | POA: Diagnosis present

## 2018-09-16 DIAGNOSIS — J189 Pneumonia, unspecified organism: Secondary | ICD-10-CM | POA: Diagnosis present

## 2018-09-16 DIAGNOSIS — N182 Chronic kidney disease, stage 2 (mild): Secondary | ICD-10-CM | POA: Diagnosis present

## 2018-09-16 DIAGNOSIS — I5033 Acute on chronic diastolic (congestive) heart failure: Secondary | ICD-10-CM | POA: Diagnosis present

## 2018-09-16 DIAGNOSIS — Z8701 Personal history of pneumonia (recurrent): Secondary | ICD-10-CM | POA: Diagnosis not present

## 2018-09-16 DIAGNOSIS — J9621 Acute and chronic respiratory failure with hypoxia: Secondary | ICD-10-CM | POA: Diagnosis present

## 2018-09-16 DIAGNOSIS — R0902 Hypoxemia: Secondary | ICD-10-CM

## 2018-09-16 DIAGNOSIS — I509 Heart failure, unspecified: Secondary | ICD-10-CM

## 2018-09-16 DIAGNOSIS — H6692 Otitis media, unspecified, left ear: Secondary | ICD-10-CM | POA: Diagnosis present

## 2018-09-16 DIAGNOSIS — J441 Chronic obstructive pulmonary disease with (acute) exacerbation: Secondary | ICD-10-CM | POA: Diagnosis present

## 2018-09-16 DIAGNOSIS — I082 Rheumatic disorders of both aortic and tricuspid valves: Secondary | ICD-10-CM | POA: Diagnosis present

## 2018-09-16 DIAGNOSIS — Z79899 Other long term (current) drug therapy: Secondary | ICD-10-CM

## 2018-09-16 DIAGNOSIS — Z20828 Contact with and (suspected) exposure to other viral communicable diseases: Secondary | ICD-10-CM | POA: Diagnosis present

## 2018-09-16 DIAGNOSIS — Z881 Allergy status to other antibiotic agents status: Secondary | ICD-10-CM

## 2018-09-16 DIAGNOSIS — I10 Essential (primary) hypertension: Secondary | ICD-10-CM | POA: Diagnosis present

## 2018-09-16 DIAGNOSIS — Z7982 Long term (current) use of aspirin: Secondary | ICD-10-CM

## 2018-09-16 LAB — CBC WITH DIFFERENTIAL/PLATELET
Abs Immature Granulocytes: 0.02 10*3/uL (ref 0.00–0.07)
Basophils Absolute: 0.1 10*3/uL (ref 0.0–0.1)
Basophils Relative: 1 %
Eosinophils Absolute: 0.2 10*3/uL (ref 0.0–0.5)
Eosinophils Relative: 3 %
HCT: 32.6 % — ABNORMAL LOW (ref 36.0–46.0)
Hemoglobin: 11 g/dL — ABNORMAL LOW (ref 12.0–15.0)
Immature Granulocytes: 0 %
Lymphocytes Relative: 18 %
Lymphs Abs: 1.4 10*3/uL (ref 0.7–4.0)
MCH: 30 pg (ref 26.0–34.0)
MCHC: 33.7 g/dL (ref 30.0–36.0)
MCV: 88.8 fL (ref 80.0–100.0)
Monocytes Absolute: 0.5 10*3/uL (ref 0.1–1.0)
Monocytes Relative: 7 %
Neutro Abs: 5.7 10*3/uL (ref 1.7–7.7)
Neutrophils Relative %: 71 %
Platelets: 256 10*3/uL (ref 150–400)
RBC: 3.67 MIL/uL — ABNORMAL LOW (ref 3.87–5.11)
RDW: 14.5 % (ref 11.5–15.5)
WBC: 7.9 10*3/uL (ref 4.0–10.5)
nRBC: 0 % (ref 0.0–0.2)

## 2018-09-16 LAB — COMPREHENSIVE METABOLIC PANEL
ALT: 12 U/L (ref 0–44)
AST: 19 U/L (ref 15–41)
Albumin: 3.7 g/dL (ref 3.5–5.0)
Alkaline Phosphatase: 103 U/L (ref 38–126)
Anion gap: 10 (ref 5–15)
BUN: 33 mg/dL — ABNORMAL HIGH (ref 8–23)
CO2: 22 mmol/L (ref 22–32)
Calcium: 9.3 mg/dL (ref 8.9–10.3)
Chloride: 95 mmol/L — ABNORMAL LOW (ref 98–111)
Creatinine, Ser: 1.1 mg/dL — ABNORMAL HIGH (ref 0.44–1.00)
GFR calc Af Amer: 52 mL/min — ABNORMAL LOW (ref >60)
GFR calc non Af Amer: 44 mL/min — ABNORMAL LOW (ref >60)
Glucose, Bld: 136 mg/dL — ABNORMAL HIGH (ref 70–99)
Potassium: 4.8 mmol/L (ref 3.5–5.1)
Sodium: 127 mmol/L — ABNORMAL LOW (ref 135–145)
Total Bilirubin: 0.5 mg/dL (ref 0.3–1.2)
Total Protein: 7.4 g/dL (ref 6.5–8.1)

## 2018-09-16 LAB — SARS CORONAVIRUS 2 BY RT PCR (HOSPITAL ORDER, PERFORMED IN ~~LOC~~ HOSPITAL LAB): SARS Coronavirus 2: NEGATIVE

## 2018-09-16 LAB — TROPONIN I: Troponin I: <0.03 ng/mL (ref <0.03)

## 2018-09-16 LAB — BRAIN NATRIURETIC PEPTIDE: B Natriuretic Peptide: 208 pg/mL — ABNORMAL HIGH (ref 0.0–100.0)

## 2018-09-16 MED ORDER — SODIUM CHLORIDE 0.9% FLUSH
3.0000 mL | Freq: Two times a day (BID) | INTRAVENOUS | Status: DC
  Administered 2018-09-16: 3 mL via INTRAVENOUS

## 2018-09-16 MED ORDER — ACETAMINOPHEN 500 MG PO TABS
ORAL_TABLET | ORAL | Status: AC
  Administered 2018-09-16: 21:00:00 1000 mg via ORAL
  Filled 2018-09-16: qty 2

## 2018-09-16 MED ORDER — LEVOTHYROXINE SODIUM 100 MCG PO TABS
100.0000 ug | ORAL_TABLET | Freq: Every day | ORAL | Status: DC
  Administered 2018-09-17 – 2018-09-20 (×4): 100 ug via ORAL
  Filled 2018-09-16 (×5): qty 1

## 2018-09-16 MED ORDER — SODIUM CHLORIDE 0.9 % IV SOLN: 1.0000 g | Freq: Three times a day (TID) | INTRAVENOUS | Status: DC

## 2018-09-16 MED ORDER — ACETAMINOPHEN 325 MG PO TABS
650.0000 mg | ORAL_TABLET | Freq: Four times a day (QID) | ORAL | Status: DC | PRN
  Administered 2018-09-17 – 2018-09-20 (×5): 650 mg via ORAL
  Filled 2018-09-16 (×6): qty 2

## 2018-09-16 MED ORDER — ONDANSETRON HCL 4 MG/2ML IJ SOLN: 4.0000 mg | Freq: Four times a day (QID) | INTRAMUSCULAR | Status: DC | PRN

## 2018-09-16 MED ORDER — METHYLPREDNISOLONE SODIUM SUCC 125 MG IJ SOLR
60.0000 mg | Freq: Four times a day (QID) | INTRAMUSCULAR | Status: DC
  Administered 2018-09-16 – 2018-09-20 (×15): 60 mg via INTRAVENOUS
  Filled 2018-09-16 (×15): qty 2

## 2018-09-16 MED ORDER — AMLODIPINE BESYLATE 10 MG PO TABS
10.0000 mg | ORAL_TABLET | Freq: Every day | ORAL | Status: DC
  Administered 2018-09-17 – 2018-09-20 (×4): 10 mg via ORAL
  Filled 2018-09-16 (×4): qty 1

## 2018-09-16 MED ORDER — FUROSEMIDE 10 MG/ML IJ SOLN
INTRAMUSCULAR | Status: AC
  Filled 2018-09-16: qty 4

## 2018-09-16 MED ORDER — PRAVASTATIN SODIUM 40 MG PO TABS
40.0000 mg | ORAL_TABLET | Freq: Every day | ORAL | Status: DC
  Administered 2018-09-17 – 2018-09-19 (×4): 40 mg via ORAL
  Filled 2018-09-16 (×4): qty 1

## 2018-09-16 MED ORDER — ENOXAPARIN SODIUM 40 MG/0.4ML ~~LOC~~ SOLN
40.0000 mg | SUBCUTANEOUS | Status: DC
  Administered 2018-09-16 – 2018-09-19 (×4): 40 mg via SUBCUTANEOUS
  Filled 2018-09-16 (×4): qty 0.4

## 2018-09-16 MED ORDER — IPRATROPIUM-ALBUTEROL 0.5-2.5 (3) MG/3ML IN SOLN: 3.0000 mL | RESPIRATORY_TRACT | Status: DC | PRN

## 2018-09-16 MED ORDER — ASPIRIN EC 81 MG PO TBEC
81.0000 mg | DELAYED_RELEASE_TABLET | Freq: Every day | ORAL | Status: DC
  Administered 2018-09-17 – 2018-09-20 (×4): 81 mg via ORAL
  Filled 2018-09-16 (×4): qty 1

## 2018-09-16 MED ORDER — HYDRALAZINE HCL 25 MG PO TABS
25.0000 mg | ORAL_TABLET | Freq: Three times a day (TID) | ORAL | Status: DC
  Administered 2018-09-17 – 2018-09-20 (×10): 25 mg via ORAL
  Filled 2018-09-16 (×10): qty 1

## 2018-09-16 MED ORDER — VENLAFAXINE HCL ER 75 MG PO CP24
75.0000 mg | ORAL_CAPSULE | Freq: Every day | ORAL | Status: DC
  Administered 2018-09-17 – 2018-09-20 (×4): 75 mg via ORAL
  Filled 2018-09-16 (×4): qty 1

## 2018-09-16 MED ORDER — FUROSEMIDE 10 MG/ML IJ SOLN
40.0000 mg | Freq: Once | INTRAMUSCULAR | Status: AC
  Administered 2018-09-16: 40 mg via INTRAVENOUS

## 2018-09-16 MED ORDER — FUROSEMIDE 40 MG PO TABS
40.0000 mg | ORAL_TABLET | Freq: Every day | ORAL | Status: DC
  Administered 2018-09-17: 40 mg via ORAL
  Filled 2018-09-16: qty 1

## 2018-09-16 MED ORDER — ACETAMINOPHEN 650 MG RE SUPP: 650.0000 mg | Freq: Four times a day (QID) | RECTAL | Status: DC | PRN

## 2018-09-16 MED ORDER — MELATONIN 5 MG PO TABS
5.0000 mg | ORAL_TABLET | Freq: Every day | ORAL | Status: DC
  Administered 2018-09-16 – 2018-09-22 (×7): 5 mg via ORAL
  Filled 2018-09-16 (×8): qty 1

## 2018-09-16 MED ORDER — LISINOPRIL 20 MG PO TABS
40.0000 mg | ORAL_TABLET | Freq: Every day | ORAL | Status: DC
  Administered 2018-09-17 – 2018-09-20 (×4): 40 mg via ORAL
  Filled 2018-09-16 (×4): qty 2

## 2018-09-16 MED ORDER — ONDANSETRON HCL 4 MG PO TABS: 4.0000 mg | ORAL_TABLET | Freq: Four times a day (QID) | ORAL | Status: DC | PRN

## 2018-09-16 MED ORDER — VENLAFAXINE HCL ER 75 MG PO CP24
150.0000 mg | ORAL_CAPSULE | Freq: Every day | ORAL | Status: DC
  Administered 2018-09-17 – 2018-09-20 (×4): 150 mg via ORAL
  Filled 2018-09-16 (×4): qty 2

## 2018-09-16 MED ORDER — SODIUM CHLORIDE 0.9 % IV SOLN
3.0000 g | Freq: Four times a day (QID) | INTRAVENOUS | Status: DC
  Administered 2018-09-16 – 2018-09-20 (×15): 3 g via INTRAVENOUS
  Filled 2018-09-16 (×19): qty 3

## 2018-09-16 MED ORDER — ACETAMINOPHEN 500 MG PO TABS
1000.0000 mg | ORAL_TABLET | Freq: Once | ORAL | Status: AC
  Administered 2018-09-16: 21:00:00 1000 mg via ORAL

## 2018-09-16 NOTE — ED Notes (Signed)
Pt increased to 4L [place], sats were at 88% on 2.5L with good pleth.

## 2018-09-16 NOTE — ED Provider Notes (Addendum)
[facility] Emergency Department Provider Note   ____________________________________________   First MD Initiated Contact with Patient 09/16/18 1704     (approximate)  I have reviewed the triage vital signs and the nursing notes.   HISTORY  Chief Complaint Shortness of Breath    HPI Lisa Haney is a 85 y.o. female who reports she has been short of breath for a week.  EMS says that her son said that she has been short of breath for a month or complaining of it anyway.  Patient says she has been coughing up some phlegm which is not too dark.  She is been having some pain in her chest.  She points to her epigastric area.  The pain does not radiate.  Nothing seems to make it better or worse.  It started today maybe an hour or 2 ago.  She is not had it before.  Is not associated with nausea or sweating.  Patient does have a history of COPD.  Also has pulmonary hypertension and reportedly CHF.  Patient had a negative CT of the abdomen in February.         Past Medical History:  Diagnosis Date  . Arthritis   . Cancer ([place])    skin cancer on hand  . COPD (chronic obstructive pulmonary disease) ([place])   . Coronary artery disease    stent  . Diverticulitis   . Hypertension   . Stroke [facility])    TIA  . TIA (transient ischemic attack)     Patient Active Problem List   Diagnosis Date Noted  . COPD exacerbation ([place]) 09/16/2018  . Acute on chronic diastolic CHF (congestive heart failure) ([place]) 09/16/2018  . HTN (hypertension) 06/22/2018  . CAD (coronary artery disease) 06/22/2018  . Aphasia 06/22/2018  . Hypertensive urgency 06/11/2018  . Pneumonia 05/04/2017  . COPD (chronic obstructive pulmonary disease) ([place]) 02/13/2016    Past Surgical History:  Procedure Laterality Date  . ABDOMINAL HYSTERECTOMY    . CAROTID STENT    . CHOLECYSTECTOMY    . DILATION AND CURETTAGE OF UTERUS    . NASAL SINUS SURGERY    . OVARIAN CYST REMOVAL    . REPLACEMENT  TOTAL KNEE Right   . TONSILLECTOMY      Prior to Admission medications   Medication Sig Start Date End Date Taking? Authorizing Provider  acetaminophen (TYLENOL) 325 MG tablet Take 650 mg by mouth every 6 (six) hours as needed.   Yes [provider]  Albuterol Sulfate 108 (90 Base) MCG/ACT AEPB Inhale 2 puffs into the lungs every 6 (six) hours as needed. 08/06/18 08/06/19 Yes [provider]  amLODipine (NORVASC) 10 MG tablet Take 1 tablet (10 mg total) by mouth daily. 06/25/18  Yes Gladstone Lighter, MD  aspirin 81 MG tablet Take 81 mg by mouth daily.   Yes [provider]  cholecalciferol (VITAMIN D) 1000 units tablet Take 2,000 Units by mouth daily.    Yes [provider]  furosemide (LASIX) 40 MG tablet Take 40 mg by mouth daily. 09/08/18  Yes [provider]  hydrALAZINE (APRESOLINE) 25 MG tablet Take 1 tablet (25 mg total) by mouth every 8 (eight) hours. 06/24/18  Yes Gladstone Lighter, MD  levothyroxine (SYNTHROID, LEVOTHROID) 100 MCG tablet Take 100 mcg by mouth daily before breakfast.    Yes [provider]  lisinopril (PRINIVIL,ZESTRIL) 40 MG tablet Take 40 mg by mouth daily.    Yes [provider]  loratadine (CLARITIN)  10 MG tablet Take 10 mg by mouth daily at 2 PM.    Yes [provider]  Melatonin 5 MG TABS Take 5 mg by mouth daily at 8 pm.    Yes [provider]  potassium chloride (MICRO-K) 10 MEQ CR capsule Take 10 mEq by mouth daily. 07/24/18  Yes [provider]  pravastatin (PRAVACHOL) 40 MG tablet Take 40 mg by mouth daily at 8 pm.    Yes [provider]  senna-docusate (SENOKOT-S) 8.6-50 MG tablet Take 1 tablet by mouth at bedtime as needed for mild constipation. 06/24/18  Yes Gladstone Lighter, MD  sildenafil (REVATIO) 20 MG tablet Take 60 mg by mouth 3 (three) times daily.   Yes [provider]  venlafaxine XR (EFFEXOR-XR) 150 MG 24 hr capsule Take 150 mg by mouth daily.    Yes [provider]  venlafaxine XR (EFFEXOR-XR) 75 MG 24 hr capsule Take 75 mg by mouth daily.   Yes [provider]    Allergies Doxycycline  No family history on file.  Social History Social History   Tobacco Use  . Smoking status: Never Smoker  . Smokeless tobacco: Never Used  Substance Use Topics  . Alcohol use: No  . Drug use: No    Review of Systems  Constitutional: No fever/chills Eyes: No visual changes. ENT: No sore throat. Cardiovascular:chest pain. Respiratory:  shortness of breath. Gastrointestinal: No abdominal pain.  No nausea, no vomiting.  No diarrhea.  No constipation. Genitourinary: Negative for dysuria. Musculoskeletal: Negative for back pain. Skin: Negative for rash. Neurological: Negative for headaches, focal weakness   ____________________________________________   PHYSICAL EXAM:  VITAL SIGNS: ED Triage Vitals  Enc Vitals Group     BP 09/16/18 1708 (!) 172/54     Pulse Rate 09/16/18 1708 77     Resp 09/16/18 1708 (!) 25     Temp 09/16/18 1708 (!) 96 F (35.6 C)     Temp Source 09/16/18 1708 Axillary     SpO2 09/16/18 1708 91 %     Weight 09/16/18 1705 180 lb (81.6 kg)     Height 09/16/18 1705 5\' 6"  (1.676 m)     Head Circumference --      Peak Flow --      Pain Score 09/16/18 1705 4     Pain Loc --      Pain Edu? --      Excl. in [place]? --     Constitutional: Alert and oriented.  Appears chronically ill Eyes: Conjunctivae are normal. . Head: Atraumatic. Nose: No congestion/rhinnorhea. Mouth/Throat: Mucous membranes are moist.  Oropharynx non-erythematous. Neck: No stridor.  Cardiovascular: Normal rate, regular rhythm. Grossly normal heart sounds.  Good peripheral circulation. Respiratory: Normal respiratory effort.  No retractions. Lungs CTAB. Gastrointestinal: Soft and nontender. No distention. No abdominal bruits. No CVA tenderness. Musculoskeletal: No lower extremity tenderness bilateral 2+ edema.   Neurologic:  Normal speech and language. No gross focal neurologic deficits are appreciated. N Skin:  Skin is warm, dry and intact. No rash noted.  ____________________________________________   LABS (all labs ordered are listed, but only abnormal results are displayed)  Labs Reviewed  COMPREHENSIVE METABOLIC PANEL - Abnormal; Notable for the following components:      Result Value   Sodium 127 (*)    Chloride 95 (*)    Glucose, Bld 136 (*)    BUN 33 (*)    Creatinine, Ser 1.10 (*)    GFR calc non Af Amer 44 (*)  GFR calc Af Amer 52 (*)    All other components within normal limits  BRAIN NATRIURETIC PEPTIDE - Abnormal; Notable for the following components:   B Natriuretic Peptide 208.0 (*)    All other components within normal limits  CBC WITH DIFFERENTIAL/PLATELET - Abnormal; Notable for the following components:   RBC 3.67 (*)    Hemoglobin 11.0 (*)    HCT 32.6 (*)    All other components within normal limits  SARS CORONAVIRUS 2 (HOSPITAL ORDER, [facility])  TROPONIN I  BASIC METABOLIC PANEL  CBC   ____________________________________________  EKG  EKG read interpreted by me shows normal sinus rhythm rate of 76 normal axis somewhat irregular baseline in 1 or 2 of the leaves otherwise no acute changes are seen ____________________________________________  RADIOLOGY  ED MD interpretation:    Official radiology report(s): Dg Chest Portable 1 View  Result Date: 09/16/2018 CLINICAL DATA:  Worsening shortness of breath. EXAM: PORTABLE CHEST 1 VIEW COMPARISON:  06/10/2018 FINDINGS: 1719 hours. Asymmetric airspace disease, right greater than left suggest multifocal pneumonia although asymmetric edema could have this appearance. Probable tiny right pleural effusion. The cardio pericardial silhouette is enlarged. The visualized bony structures of the thorax are intact. Insert wire IMPRESSION: Right greater than left airspace disease compatible with  diffuse infection or asymmetric edema. Tiny right effusion. Electronically Signed   By: Misty Stanley M.D.   On: 09/16/2018 18:06    ____________________________________________   PROCEDURES  Procedure(s) performed (including Critical Care):  Procedures   ____________________________________________   INITIAL IMPRESSION / ASSESSMENT AND PLAN / ED COURSE  Patient with hyponatremia and apparent worsening congestive heart failure is worse on one side of the chest x-ray.  She does have bilateral edema.  She is not running a fever having a white count.    And also complains of left ear pain.  On evaluation of her ear she does appear to have otitis media on the left.  We will give her some ampicillin IV for that.  She does not think she can swallow pills right now.          ____________________________________________   FINAL CLINICAL IMPRESSION(S) / ED DIAGNOSES  Final diagnoses:  COPD exacerbation ([place])  Hypoxia  Congestive heart failure, unspecified HF chronicity, unspecified heart failure type ([place])  Hyponatremia     ED Discharge Orders    None       Note:  This document was prepared using Dragon voice recognition software and may include unintentional dictation errors.    Nena Polio, MD 09/16/18 Lona Kettle    Nena Polio, MD 09/17/18 0005

## 2018-09-16 NOTE — ED Notes (Signed)
Attempted to call report at this time 

## 2018-09-16 NOTE — ED Triage Notes (Addendum)
Pt to ED via EMS from home. Pt arrives on 3L [place] on 2L chronically at home. Pt has hx of copd, chf, and pulmonary hypertension. Pt has had increase sob xfew days. Pt states she has chest discomfort new today. Pt unable to speak in complete sentences.

## 2018-09-16 NOTE — H&P (Signed)
[facility] NAME: Lisa Haney    MR#:  [id number]  DATE OF BIRTH:  1927/04/14  DATE OF ADMISSION:  09/16/2018  PRIMARY CARE PHYSICIAN: Glendon Axe, MD   REQUESTING/REFERRING PHYSICIAN: Cinda Quest, MD  CHIEF COMPLAINT:   Chief Complaint  Patient presents with  . Shortness of Breath    HISTORY OF PRESENT ILLNESS:  Lisa Haney  is a 85 y.o. female who presents with chief complaint as above.  Patient presents the ED with progressive cough and shortness of breath.  She states that her son has been coughing for about a month, and she has been coughing for about a week.  Over the last 2 days or so her shortness of breath is gotten worse.  Today she had significant wheezing per her report.  Here in the ED imaging shows right-sided opacity representing either infection or atypical edema.  Patient does have a history of heart failure and COPD.  Her white blood cell count was normal, with a reassuring differential.  Hospitalist called for admission  PAST MEDICAL HISTORY:   Past Medical History:  Diagnosis Date  . Arthritis   . Cancer ([place])    skin cancer on hand  . COPD (chronic obstructive pulmonary disease) ([place])   . Coronary artery disease    stent  . Diverticulitis   . Hypertension   . Stroke [facility])    TIA  . TIA (transient ischemic attack)      PAST SURGICAL HISTORY:   Past Surgical History:  Procedure Laterality Date  . ABDOMINAL HYSTERECTOMY    . CAROTID STENT    . CHOLECYSTECTOMY    . DILATION AND CURETTAGE OF UTERUS    . NASAL SINUS SURGERY    . OVARIAN CYST REMOVAL    . REPLACEMENT TOTAL KNEE Right   . TONSILLECTOMY       SOCIAL HISTORY:   Social History   Tobacco Use  . Smoking status: Never Smoker  . Smokeless tobacco: Never Used  Substance Use Topics  . Alcohol use: No     FAMILY HISTORY:    Family history reviewed and is non-contributory DRUG ALLERGIES:   Allergies  Allergen  Reactions  . Doxycycline Other (See Comments)    Severe upset stomach and pain     MEDICATIONS AT HOME:   Prior to Admission medications   Medication Sig Start Date End Date Taking? Authorizing Provider  acetaminophen (TYLENOL) 325 MG tablet Take 650 mg by mouth every 6 (six) hours as needed.   Yes [provider]  Albuterol Sulfate 108 (90 Base) MCG/ACT AEPB Inhale 2 puffs into the lungs every 6 (six) hours as needed. 08/06/18 08/06/19 Yes [provider]  amLODipine (NORVASC) 10 MG tablet Take 1 tablet (10 mg total) by mouth daily. 06/25/18  Yes Gladstone Lighter, MD  aspirin 81 MG tablet Take 81 mg by mouth daily.   Yes [provider]  cholecalciferol (VITAMIN D) 1000 units tablet Take 2,000 Units by mouth daily.    Yes [provider]  furosemide (LASIX) 40 MG tablet Take 40 mg by mouth daily. 09/08/18  Yes [provider]  hydrALAZINE (APRESOLINE) 25 MG tablet Take 1 tablet (25 mg total) by mouth every 8 (eight) hours. 06/24/18  Yes Gladstone Lighter, MD  levothyroxine (SYNTHROID, LEVOTHROID) 100 MCG tablet Take 100 mcg by mouth daily before breakfast.    Yes [provider]  lisinopril (PRINIVIL,ZESTRIL) 40 MG tablet Take 40 mg by  mouth daily.    Yes [provider]  loratadine (CLARITIN) 10 MG tablet Take 10 mg by mouth daily at 2 PM.    Yes [provider]  Melatonin 5 MG TABS Take 5 mg by mouth daily at 8 pm.    Yes [provider]  potassium chloride (MICRO-K) 10 MEQ CR capsule Take 10 mEq by mouth daily. 07/24/18  Yes [provider]  pravastatin (PRAVACHOL) 40 MG tablet Take 40 mg by mouth daily at 8 pm.    Yes [provider]  senna-docusate (SENOKOT-S) 8.6-50 MG tablet Take 1 tablet by mouth at bedtime as needed for mild constipation. 06/24/18  Yes Gladstone Lighter, MD  sildenafil (REVATIO) 20 MG tablet Take 60 mg by mouth 3 (three) times daily.   Yes [provider]   venlafaxine XR (EFFEXOR-XR) 150 MG 24 hr capsule Take 150 mg by mouth daily.   Yes [provider]  venlafaxine XR (EFFEXOR-XR) 75 MG 24 hr capsule Take 75 mg by mouth daily.   Yes [provider]    REVIEW OF SYSTEMS:  Review of Systems  Constitutional: Positive for malaise/fatigue. Negative for chills, fever and weight loss.  HENT: Negative for ear pain, hearing loss and tinnitus.   Eyes: Negative for blurred vision, double vision, pain and redness.  Respiratory: Positive for cough, shortness of breath and wheezing. Negative for hemoptysis.   Cardiovascular: Negative for chest pain, palpitations, orthopnea and leg swelling.  Gastrointestinal: Negative for abdominal pain, constipation, diarrhea, nausea and vomiting.  Genitourinary: Negative for dysuria, frequency and hematuria.  Musculoskeletal: Negative for back pain, joint pain and neck pain.  Skin:       No acne, rash, or lesions  Neurological: Negative for dizziness, tremors, focal weakness and weakness.  Endo/Heme/Allergies: Negative for polydipsia. Does not bruise/bleed easily.  Psychiatric/Behavioral: Negative for depression. The patient is not nervous/anxious and does not have insomnia.      VITAL SIGNS:   Vitals:   09/16/18 2000 09/16/18 2030 09/16/18 2038 09/16/18 2045  BP: (!) 166/56 (!) 162/55 (!) 162/55   Pulse: 78 77 74 78  Resp:   20   Temp:      TempSrc:      SpO2:   90% 94%  Weight:      Height:       Wt Readings from Last 3 Encounters:  09/16/18 81.6 kg  06/21/18 90.7 kg  06/12/18 96.6 kg    PHYSICAL EXAMINATION:  Physical Exam  Vitals reviewed. Constitutional: She is oriented to person, place, and time. She appears well-developed and well-nourished. No distress.  HENT:  Head: Normocephalic and atraumatic.  Mouth/Throat: Oropharynx is clear and moist.  Eyes: Pupils are equal, round, and reactive to light. Conjunctivae and EOM are normal. No scleral icterus.  Neck: Normal range of  motion. Neck supple. No JVD present. No thyromegaly present.  Cardiovascular: Normal rate, regular rhythm and intact distal pulses. Exam reveals no gallop and no friction rub.  No murmur heard. Respiratory: Effort normal. No respiratory distress. She has wheezes. She has rales.  GI: Soft. Bowel sounds are normal. She exhibits no distension. There is no abdominal tenderness.  Musculoskeletal: Normal range of motion.        General: No edema.     Comments: No arthritis, no gout  Lymphadenopathy:    She has no cervical adenopathy.  Neurological: She is alert and oriented to person, place, and time. No cranial nerve deficit.  No dysarthria, no aphasia  Skin: Skin is warm and dry. No rash noted. No erythema.  Psychiatric: She has a normal mood and affect. Her behavior is normal. Judgment and thought content normal.    LABORATORY PANEL:   CBC Recent Labs  Lab 09/16/18 1717  WBC 7.9  HGB 11.0*  HCT 32.6*  PLT 256   ------------------------------------------------------------------------------------------------------------------  Chemistries  Recent Labs  Lab 09/16/18 1717  NA 127*  K 4.8  CL 95*  CO2 22  GLUCOSE 136*  BUN 33*  CREATININE 1.10*  CALCIUM 9.3  AST 19  ALT 12  ALKPHOS 103  BILITOT 0.5   ------------------------------------------------------------------------------------------------------------------  Cardiac Enzymes Recent Labs  Lab 09/16/18 1717  TROPONINI <0.03   ------------------------------------------------------------------------------------------------------------------  RADIOLOGY:  Dg Chest Portable 1 View  Result Date: 09/16/2018 CLINICAL DATA:  Worsening shortness of breath. EXAM: PORTABLE CHEST 1 VIEW COMPARISON:  06/10/2018 FINDINGS: 1719 hours. Asymmetric airspace disease, right greater than left suggest multifocal pneumonia although asymmetric edema could have this appearance. Probable tiny right pleural effusion. The cardio pericardial  silhouette is enlarged. The visualized bony structures of the thorax are intact. Insert wire IMPRESSION: Right greater than left airspace disease compatible with diffuse infection or asymmetric edema. Tiny right effusion. Electronically Signed   By: Misty Stanley M.D.   On: 09/16/2018 18:06    EKG:   Orders placed or performed during the hospital encounter of 09/16/18  . EKG 12-Lead  . EKG 12-Lead  . ED EKG  . ED EKG    IMPRESSION AND PLAN:  Principal Problem:   Acute on chronic diastolic CHF (congestive heart failure) (HCC) -IV Lasix given in the ED, will continue her home dose of Lasix starting tomorrow.  Given that the fluid in her lungs could potentially be hiding an underlying infection, or that her opacity could be infection, we also treating her with antibiotics.  Cardiology consult Active Problems:   COPD exacerbation (HCC) -IV Solu-Medrol, antibiotics as above, PRN supportive treatment with duo nebs and antitussive, continue home meds   HTN (hypertension) -home dose antihypertensives   CAD (coronary artery disease) -continue home medications  Chart review performed and case discussed with ED provider. Labs, imaging and/or ECG reviewed by provider and discussed with patient/family. Management plans discussed with the patient and/or family.  COVID-19 status: Test pending  DVT PROPHYLAXIS: SubQ lovenox   GI PROPHYLAXIS:  None  ADMISSION STATUS: Inpatient     CODE STATUS: Full Code Status History    Date Active Date Inactive Code Status Order ID Comments User Context   06/22/2018 1823 06/26/2018 1816 Full Code [id number]  Lance Coon, MD Inpatient   06/11/2018 0412 06/12/2018 1849 Full Code [id number]  Harrie Foreman, MD Inpatient   05/14/2017 1927 05/17/2017 1739 Full Code [id number]  Demetrios Loll, MD Inpatient   05/04/2017 1755 05/09/2017 1507 Full Code [id number]  Fritzi Mandes, MD Inpatient   06/21/2016 0959 06/21/2016 1413 Full Code [id number]  Isaias Cowman, MD Inpatient    02/13/2016 0417 02/15/2016 2021 Full Code [id number]  Hugelmeyer, Ubaldo Glassing, DO Inpatient      TOTAL TIME TAKING CARE OF THIS PATIENT: 45 minutes.   This patient was evaluated in the context of the global COVID-19 pandemic, which necessitated consideration that the patient might be at risk for infection with the SARS-CoV-2 virus that causes COVID-19. Institutional protocols and algorithms that pertain to the evaluation of patients at risk for COVID-19 are in a state of rapid change based on information released by regulatory bodies including the CDC and federal and  state organizations. These policies and algorithms were followed to the best of this provider's knowledge to date during the patient's care at this facility.  Lisa Haney 09/16/2018, 9:22 PM  [facility]  Office  [phone]  CC: Primary care physician; Glendon Axe, MD  Note:  This document was prepared using Dragon voice recognition software and may include unintentional dictation errors.

## 2018-09-16 NOTE — Progress Notes (Signed)
Pharmacy Antibiotic Note  Lisa Haney is a 85 y.o. female admitted on 09/16/2018 with CAP.  Pharmacy has been consulted for Unasyn dosing.  Plan: Unasyn 3 gm IV Q6H ordered to start @ 2145 on 5/19.   Height: 5\' 6"  (167.6 cm) Weight: 180 lb (81.6 kg) IBW/kg (Calculated) : 59.3  Temp (24hrs), Avg:96 F (35.6 C), Min:96 F (35.6 C), Max:96 F (35.6 C)  Recent Labs  Lab 09/16/18 1717  WBC 7.9  CREATININE 1.10*    Estimated Creatinine Clearance: 37.3 mL/min (A) (by C-G formula based on SCr of 1.1 mg/dL (H)).    Allergies  Allergen Reactions  . Doxycycline Other (See Comments)    Severe upset stomach and pain     Antimicrobials this admission:   >>    >>  Dose adjustments this admission:   Microbiology results:  BCx:  UCx:    Sputum:    MRSA PCR:   Thank you for allowing pharmacy to be a part of this patient's care.  Wilberta Dorvil D 09/16/2018 9:41 PM

## 2018-09-16 NOTE — ED Notes (Signed)
ED TO INPATIENT HANDOFF REPORT  ED Nurse Name and Phone #: Terri Piedra [phone]  S Name/Age/Gender Lisa Haney 85 y.o. female Room/Bed: ED11A/ED11A  Code Status   Code Status: Prior  Home/SNF/Other Home Patient oriented to: self, place, time and situation Is this baseline? Yes   Triage Complete: Triage complete  Chief Complaint Sob  Triage Note Pt to ED via EMS from home. Pt arrives on 3L  on 2L chronically at home. Pt has hx of copd, chf, and pulmonary hypertension. Pt has had increase sob xfew days. Pt states she has chest discomfort new today. Pt unable to speak in complete sentences.    Allergies Allergies  Allergen Reactions  . Doxycycline Other (See Comments)    Severe upset stomach and pain     Level of Care/Admitting Diagnosis ED Disposition    ED Disposition Condition [facility]: [place] [28-Oct-2018]  Level of Care: Telemetry [5]  Covid Evaluation: Screening Protocol (No Symptoms)  Diagnosis: Acute on chronic diastolic CHF (congestive heart failure) [facility]) [[id number]]  Admitting Physician: Lance Coon [[id number]]  Attending Physician: Jannifer Franklin, DAVID [phone]  Estimated length of stay: past midnight tomorrow  Certification:: I certify this patient will need inpatient services for at least 2 midnights  Bed request comments: 2a  PT Class (Do Not Modify): Inpatient [101]  PT Acc Code (Do Not Modify): Private [1]       B Medical/Surgery History Past Medical History:  Diagnosis Date  . Arthritis   . Cancer ([place])    skin cancer on hand  . COPD (chronic obstructive pulmonary disease) ([place])   . Coronary artery disease    stent  . Diverticulitis   . Hypertension   . Stroke [facility])    TIA  . TIA (transient ischemic attack)    Past Surgical History:  Procedure Laterality Date  . ABDOMINAL HYSTERECTOMY    . CAROTID STENT    . CHOLECYSTECTOMY    . DILATION AND CURETTAGE OF UTERUS    . NASAL SINUS SURGERY    .  OVARIAN CYST REMOVAL    . REPLACEMENT TOTAL KNEE Right   . TONSILLECTOMY       A IV Location/Drains/Wounds Patient Lines/Drains/Airways Status   Active Line/Drains/Airways    Name:   Placement date:   Placement time:   Site:   Days:   Peripheral IV 09/16/18 Right Antecubital   09/16/18    1717    Antecubital   less than 1   External Urinary Catheter   06/22/18    0230    -   86          Intake/Output Last 24 hours No intake or output data in the 24 hours ending 09/16/18 2133  Labs/Imaging Results for orders placed or performed during the hospital encounter of 09/16/18 (from the past 48 hour(s))  Comprehensive metabolic panel     Status: Abnormal   Collection Time: 09/16/18  5:17 PM  Result Value Ref Range   Sodium 127 (L) 135 - 145 mmol/L   Potassium 4.8 3.5 - 5.1 mmol/L   Chloride 95 (L) 98 - 111 mmol/L   CO2 22 22 - 32 mmol/L   Glucose, Bld 136 (H) 70 - 99 mg/dL   BUN 33 (H) 8 - 23 mg/dL   Creatinine, Ser 1.10 (H) 0.44 - 1.00 mg/dL   Calcium 9.3 8.9 - 10.3 mg/dL   Total Protein 7.4 6.5 - 8.1 g/dL   Albumin 3.7 3.5 -  5.0 g/dL   AST 19 15 - 41 U/L   ALT 12 0 - 44 U/L   Alkaline Phosphatase 103 38 - 126 U/L   Total Bilirubin 0.5 0.3 - 1.2 mg/dL   GFR calc non Af Amer 44 (L) >60 mL/min   GFR calc Af Amer 52 (L) >60 mL/min   Anion gap 10 5 - 15    Comment: Performed at [facility], [address] [place], [place] [address]  Troponin I - Once     Status: None   Collection Time: 09/16/18  5:17 PM  Result Value Ref Range   Troponin I <0.03 <0.03 ng/mL    Comment: Performed at [facility], [address] [place], [place] [address]  Brain natriuretic peptide     Status: Abnormal   Collection Time: 09/16/18  5:17 PM  Result Value Ref Range   B Natriuretic Peptide 208.0 (H) 0.0 - 100.0 pg/mL    Comment: Performed at [facility], [address] [place], [place] [address]  CBC with Differential     Status: Abnormal   Collection Time:  09/16/18  5:17 PM  Result Value Ref Range   WBC 7.9 4.0 - 10.5 K/uL   RBC 3.67 (L) 3.87 - 5.11 MIL/uL   Hemoglobin 11.0 (L) 12.0 - 15.0 g/dL   HCT 32.6 (L) 36.0 - 46.0 %   MCV 88.8 80.0 - 100.0 fL   MCH 30.0 26.0 - 34.0 pg   MCHC 33.7 30.0 - 36.0 g/dL   RDW 14.5 11.5 - 15.5 %   Platelets 256 150 - 400 K/uL   nRBC 0.0 0.0 - 0.2 %   Neutrophils Relative % 71 %   Neutro Abs 5.7 1.7 - 7.7 K/uL   Lymphocytes Relative 18 %   Lymphs Abs 1.4 0.7 - 4.0 K/uL   Monocytes Relative 7 %   Monocytes Absolute 0.5 0.1 - 1.0 K/uL   Eosinophils Relative 3 %   Eosinophils Absolute 0.2 0.0 - 0.5 K/uL   Basophils Relative 1 %   Basophils Absolute 0.1 0.0 - 0.1 K/uL   Immature Granulocytes 0 %   Abs Immature Granulocytes 0.02 0.00 - 0.07 K/uL    Comment: Performed at [facility], [address] [place], [place] [address]  SARS Coronavirus 2 (CEPHEID - Performed in [place] hospital lab), Hosp Order     Status: None   Collection Time: 09/16/18  8:01 PM  Result Value Ref Range   SARS Coronavirus 2 NEGATIVE NEGATIVE    Comment: (NOTE) If result is NEGATIVE SARS-CoV-2 target nucleic acids are NOT DETECTED. The SARS-CoV-2 RNA is generally detectable in upper and lower  respiratory specimens during the acute phase of infection. The lowest  concentration of SARS-CoV-2 viral copies this assay can detect is 250  copies / mL. A negative result does not preclude SARS-CoV-2 infection  and should not be used as the sole basis for treatment or other  patient management decisions.  A negative result may occur with  improper specimen collection / handling, submission of specimen other  than nasopharyngeal swab, presence of viral mutation(s) within the  areas targeted by this assay, and inadequate number of viral copies  (<250 copies / mL). A negative result must be combined with clinical  observations, patient history, and epidemiological information. If result is POSITIVE SARS-CoV-2 target  nucleic acids are DETECTED. The SARS-CoV-2 RNA is generally detectable in upper and lower  respiratory specimens dur ing the acute phase  of infection.  Positive  results are indicative of active infection with SARS-CoV-2.  Clinical  correlation with patient history and other diagnostic information is  necessary to determine patient infection status.  Positive results do  not rule out bacterial infection or co-infection with other viruses. If result is PRESUMPTIVE POSTIVE SARS-CoV-2 nucleic acids MAY BE PRESENT.   A presumptive positive result was obtained on the submitted specimen  and confirmed on repeat testing.  While 2019 novel coronavirus  (SARS-CoV-2) nucleic acids may be present in the submitted sample  additional confirmatory testing may be necessary for epidemiological  and / or clinical management purposes  to differentiate between  SARS-CoV-2 and other Sarbecovirus currently known to infect humans.  If clinically indicated additional testing with an alternate test  methodology [phone]) is advised. The SARS-CoV-2 RNA is generally  detectable in upper and lower respiratory sp ecimens during the acute  phase of infection. The expected result is Negative. Fact Sheet for Patients:  StrictlyIdeas.no Fact Sheet for Healthcare Providers: BankingDealers.co.za This test is not yet approved or cleared by the [place] FDA and has been authorized for detection and/or diagnosis of SARS-CoV-2 by FDA under an Emergency Use Authorization (EUA).  This EUA will remain in effect (meaning this test can be used) for the duration of the COVID-19 declaration under Section 564(b)(1) of the Act, 21 U.S.C. section 360bbb-3(b)(1), unless the authorization is terminated or revoked sooner. Performed at [facility], [address], [place], [place] [address]    Dg Chest Portable 1 View  Result Date: 09/16/2018 CLINICAL DATA:  Worsening  shortness of breath. EXAM: PORTABLE CHEST 1 VIEW COMPARISON:  06/10/2018 FINDINGS: 1719 hours. Asymmetric airspace disease, right greater than left suggest multifocal pneumonia although asymmetric edema could have this appearance. Probable tiny right pleural effusion. The cardio pericardial silhouette is enlarged. The visualized bony structures of the thorax are intact. Insert wire IMPRESSION: Right greater than left airspace disease compatible with diffuse infection or asymmetric edema. Tiny right effusion. Electronically Signed   By: Misty Stanley M.D.   On: 09/16/2018 18:06    Pending Labs [employer] (From admission, onward)    Start     Ordered   Signed and Held  CBC  (enoxaparin (LOVENOX)    CrCl >/= 30 ml/min)  Once,   R    Comments:  Baseline for enoxaparin therapy IF NOT ALREADY DRAWN.  Notify MD if PLT < 100 K.    Signed and Held   Signed and Held  Creatinine, serum  (enoxaparin (LOVENOX)    CrCl >/= 30 ml/min)  Once,   R    Comments:  Baseline for enoxaparin therapy IF NOT ALREADY DRAWN.    Signed and Held   Signed and Held  Creatinine, serum  (enoxaparin (LOVENOX)    CrCl >/= 30 ml/min)  Weekly,   R    Comments:  while on enoxaparin therapy    Signed and Held   Signed and Held  Basic metabolic panel  Tomorrow morning,   R     Signed and Held   Signed and Held  CBC  Tomorrow morning,   R     Signed and Held          Vitals/Pain Today's Vitals   09/16/18 2000 09/16/18 2030 09/16/18 2038 09/16/18 2045  BP: (!) 166/56 (!) 162/55 (!) 162/55   Pulse: 78 77 74 78  Resp:   20   Temp:      TempSrc:  SpO2:   90% 94%  Weight:      Height:      PainSc:        Isolation Precautions No active isolations  Medications Medications  ampicillin (OMNIPEN) 1 g in sodium chloride 0.9 % 100 mL IVPB (has no administration in time range)  acetaminophen (TYLENOL) tablet 1,000 mg (1,000 mg Oral Given 09/16/18 2122)  furosemide (LASIX) injection 40 mg (40 mg Intravenous Given  09/16/18 2123)    Mobility walks with device Moderate fall risk   Focused Assessments Pulmonary Assessment Handoff:  Lung sounds: Bilateral Breath Sounds: Diminished O2 Device: Nasal Cannula O2 Flow Rate (L/min): 2 L/min      R Recommendations: See Admitting Provider Note  Report given to:   Additional Notes: CHF

## 2018-09-17 ENCOUNTER — Inpatient Hospital Stay: Payer: Medicare Other

## 2018-09-17 LAB — BASIC METABOLIC PANEL
Anion gap: 13 (ref 5–15)
BUN: 29 mg/dL — ABNORMAL HIGH (ref 8–23)
CO2: 20 mmol/L — ABNORMAL LOW (ref 22–32)
Calcium: 9.1 mg/dL (ref 8.9–10.3)
Chloride: 93 mmol/L — ABNORMAL LOW (ref 98–111)
Creatinine, Ser: 1.1 mg/dL — ABNORMAL HIGH (ref 0.44–1.00)
GFR calc Af Amer: 52 mL/min — ABNORMAL LOW (ref >60)
GFR calc non Af Amer: 44 mL/min — ABNORMAL LOW (ref >60)
Glucose, Bld: 111 mg/dL — ABNORMAL HIGH (ref 70–99)
Potassium: 4.4 mmol/L (ref 3.5–5.1)
Sodium: 126 mmol/L — ABNORMAL LOW (ref 135–145)

## 2018-09-17 LAB — CBC
HCT: 32.9 % — ABNORMAL LOW (ref 36.0–46.0)
Hemoglobin: 11.1 g/dL — ABNORMAL LOW (ref 12.0–15.0)
MCH: 29.6 pg (ref 26.0–34.0)
MCHC: 33.7 g/dL (ref 30.0–36.0)
MCV: 87.7 fL (ref 80.0–100.0)
Platelets: 242 10*3/uL (ref 150–400)
RBC: 3.75 MIL/uL — ABNORMAL LOW (ref 3.87–5.11)
RDW: 14.3 % (ref 11.5–15.5)
WBC: 10.9 10*3/uL — ABNORMAL HIGH (ref 4.0–10.5)
nRBC: 0 % (ref 0.0–0.2)

## 2018-09-17 LAB — PROCALCITONIN: Procalcitonin: <0.1 ng/mL

## 2018-09-17 LAB — OSMOLALITY, URINE: Osmolality, Ur: 382 mOsm/kg (ref 300–900)

## 2018-09-17 LAB — OSMOLALITY: Osmolality: 280 mOsm/kg (ref 275–295)

## 2018-09-17 MED ORDER — FUROSEMIDE 10 MG/ML IJ SOLN
20.0000 mg | Freq: Two times a day (BID) | INTRAMUSCULAR | Status: DC
  Administered 2018-09-17 – 2018-09-18 (×4): 20 mg via INTRAVENOUS
  Filled 2018-09-17 (×4): qty 2

## 2018-09-17 MED ORDER — LORATADINE 10 MG PO TABS
10.0000 mg | ORAL_TABLET | Freq: Every day | ORAL | Status: DC
  Administered 2018-09-17 – 2018-09-19 (×3): 10 mg via ORAL
  Filled 2018-09-17 (×2): qty 1

## 2018-09-17 MED ORDER — FLUTICASONE PROPIONATE 50 MCG/ACT NA SUSP
2.0000 | Freq: Every day | NASAL | Status: DC
  Administered 2018-09-17 – 2018-09-23 (×6): 2 via NASAL
  Filled 2018-09-17: qty 16

## 2018-09-17 MED ORDER — SENNOSIDES-DOCUSATE SODIUM 8.6-50 MG PO TABS
1.0000 | ORAL_TABLET | Freq: Every day | ORAL | Status: DC
  Filled 2018-09-17: qty 1

## 2018-09-17 MED ORDER — BISACODYL 10 MG RE SUPP: 10.0000 mg | Freq: Every day | RECTAL | Status: DC | PRN

## 2018-09-17 MED ORDER — BUDESONIDE 0.25 MG/2ML IN SUSP
0.2500 mg | Freq: Two times a day (BID) | RESPIRATORY_TRACT | Status: DC
  Administered 2018-09-17 – 2018-09-23 (×12): 0.25 mg via RESPIRATORY_TRACT
  Filled 2018-09-17 (×13): qty 2

## 2018-09-17 MED ORDER — SODIUM CHLORIDE 0.9% FLUSH: 10.0000 mL | INTRAVENOUS | Status: DC | PRN

## 2018-09-17 MED ORDER — SILDENAFIL CITRATE 20 MG PO TABS
60.0000 mg | ORAL_TABLET | Freq: Three times a day (TID) | ORAL | Status: DC
  Administered 2018-09-17 – 2018-09-20 (×9): 60 mg via ORAL
  Filled 2018-09-17 (×12): qty 3

## 2018-09-17 MED ORDER — ALBUTEROL SULFATE (2.5 MG/3ML) 0.083% IN NEBU
2.5000 mg | INHALATION_SOLUTION | RESPIRATORY_TRACT | Status: DC | PRN
  Administered 2018-09-19 – 2018-09-22 (×5): 2.5 mg via RESPIRATORY_TRACT
  Filled 2018-09-17 (×5): qty 3

## 2018-09-17 MED ORDER — IPRATROPIUM-ALBUTEROL 0.5-2.5 (3) MG/3ML IN SOLN
3.0000 mL | Freq: Four times a day (QID) | RESPIRATORY_TRACT | Status: DC
  Administered 2018-09-17: 3 mL via RESPIRATORY_TRACT
  Filled 2018-09-17: qty 3

## 2018-09-17 MED ORDER — SODIUM CHLORIDE 0.9 % IV SOLN
INTRAVENOUS | Status: DC | PRN
  Administered 2018-09-17: 250 mL via INTRAVENOUS
  Administered 2018-09-18 (×2): 500 mL via INTRAVENOUS
  Administered 2018-09-19 – 2018-09-20 (×2): 30 mL via INTRAVENOUS

## 2018-09-17 MED ORDER — IPRATROPIUM-ALBUTEROL 0.5-2.5 (3) MG/3ML IN SOLN
3.0000 mL | Freq: Three times a day (TID) | RESPIRATORY_TRACT | Status: DC
  Administered 2018-09-17 – 2018-09-23 (×15): 3 mL via RESPIRATORY_TRACT
  Filled 2018-09-17 (×17): qty 3

## 2018-09-17 MED ORDER — ORAL CARE MOUTH RINSE
15.0000 mL | Freq: Two times a day (BID) | OROMUCOSAL | Status: DC
  Administered 2018-09-17 – 2018-09-23 (×5): 15 mL via OROMUCOSAL

## 2018-09-17 MED ORDER — SODIUM CHLORIDE 0.9% FLUSH
10.0000 mL | Freq: Two times a day (BID) | INTRAVENOUS | Status: DC
  Administered 2018-09-17 – 2018-09-20 (×6): 10 mL

## 2018-09-17 NOTE — Plan of Care (Signed)
Remains dyspneic with minimal exertion.  Bipap tolerated while asleep.  Placed back on nasal cannula on awakening this morning per request.

## 2018-09-17 NOTE — Progress Notes (Addendum)
Heart Failure Education Note:  CHF Education:?? Educational session with patient completed.  Provided patient with "Living Better with Heart Failure" packet. Briefly reviewed definition of heart failure and signs and symptoms of an exacerbation.?Discussed potential causes of CHF.  Explained to patient that HF is a chronic illness which requires self-assessment / self-management along with help from the cardiologist/PCP. This EP discussed definition of EF measurement along with normal value. Patient has Diastolic HF. Patient had EF of 55-60% from ECHO in February 2020.?  *Reviewed importance of and reason behind checking weight daily in the AM, after using the bathroom, but before getting dressed. Patient has functioning scale at home but is fearful of falling so does not weigh. This EP discussed options to overcome this barrier such as using walker to help step up on the scale only briefly letting go of the walker and having her son stand beside her as she weighs. Patient was eager and receptive to this idea. Patient will also ask [facility] and PT for more help with how to weigh at home if these options do not work.  *Reviewed with patient the following information: *Discussed when to call the Dr= weight gain of >2-3lb overnight of 5lb in a week,  *Discussed yellow zone= call MD: weight gain of >2-3lb overnight of 5lb in a week, increased swelling, increased SOB when lying down, chest discomfort, dizziness, increased fatigue *Red Zone= call 911: struggle to breath, fainting or near fainting, significant chest pain ?  *Heart Failure Zone Magnet given and reviewed with patient.   ? *Diet - Patient currently ordered heart healthy diet.  Referral for Dietitian Consultation for diet education has been ordered. Instructed patient to follow a low sodium diet of 2000 mg or less.  Reviewed with patient steps to reading a food label with close attention to serving size and mg of sodium. Patient reports that she eats a  healthy breakfast of Cheerios, banana, yogurt, and orange juice. Patient's son works at [employer] and brings her a meal daily. Patient reports that it doesn't taste salty. This EP discussed with patient that all foods that are prepared outside of the home have more sodium and gave healthier alternatives the patient can choose. This EP went over the "this vs that" list. Patient was very receptive to making the healthier choices. Patient was also willing to make vegetables at home and season with Mrs. Dash.  ? *Discussed fluid intake with patient as well. Patient not currently on a fluid restriction, but advised no more than 64 ounces of fluid per day. Demonstrated this volume to patient using the bedside water pitcher.   ? *Instructed patient to take medications as prescribed for heart failure. Explained briefly why patient is on the medications (either make you feel better, live longer or keep you out of the hospital) and discussed monitoring and side effects.  ? *Discussed exercise / activity. Patient reports walking around the house with walker and doing some PT prescribed exercises before becoming so short of breath. Patient currently uses a walker at home. Patient had stroke recently and received HH and PT upon returning home. Patient felt much better when receiving therapy at home and would like for that to continue. Encouraged patient to be as active as possible. At this time, patient is weak, deconditioned, and a fall risk therefore, not appropriate for Pulmonary Rehab. This EP wants patient to strengthen with [facility] and PT to reduce risk of falls, improve ADL's, and be able to walk around home with  walker with less shortness of breath.  *Smoking Cessation- Patient is a NEVER smoker.  ? *[facility] Failure [facility] - Explained the purpose of the HF Clinic. Explained to patient the HF Clinic does not replace PCP nor Cardiologist, but is an additional resource to helping patient manage heart failure at  home. Patient is currently followed by the Advanced Heart Failure and Pulmonary Hypertension Doctor at [facility]. Patient will continue care with their Clinic at this time. Patient does not wish to be followed in the [facility] Clinic. As a result, the appointment which had been made for the patient in the [facility] was cancelled per patient's request.   ? Again, the 5 Steps to Living Better with Heart Failure were reviewed with patient.   Patient thanked me for providing the above information. ?  Lisa Haney, [facility]  Exercise Physiologist Department Phone #: [phone] Fax: [phone]  Direct Line [phone] Email Address: [email or web address]

## 2018-09-17 NOTE — Consult Note (Addendum)
[facility]  CARDIOLOGY CONSULT NOTE  Patient ID: Lisa Haney MRN: [id number] DOB/AGE: October 23, 1926 85 y.o.  Admit date: 09/16/2018 Referring Physician Dr. Jannifer Franklin Primary Physician Dr. Ouida Sills Primary Cardiologist Dr. Christa See Reason for Consultation CHF exacerbation   HPI:  Lisa Haney is a 85 y.o. with a past medical history significant for severe pulmonary artery hypertension on sildenafil, HFpEF, COPD on 2L of O2 at baseline, and coronary artery disease, who presented to the ED yesterday with several weeks of worsening shortness of breath. This progresses to the point where she felt like she was gasping for breath yesterday even after a few steps. She had some associated tightness in chest but it resolved as her breathing improved with rest. She had no chest pain currently.   ER evaluation included BNP which was only mildly elevated to 208, negative troponin x1, hyponatremia with sodium of 127, and creatinine of 1.1.   Last ECHO from 11/2017 with normal LV function, EF of 55%, enlarged RV with reduced contractility of the RV. RVSP was 66 mmHg. She had moderate aortic regurgitation and mild tricuspid valve regurgitation. No valvular stenosis.   Review of systems complete and found to be negative unless listed above   Past Medical History:  Diagnosis Date  . Arthritis   . Cancer ([place])    skin cancer on hand  . COPD (chronic obstructive pulmonary disease) ([place])   . Coronary artery disease    stent  . Diverticulitis   . Hypertension   . Stroke [facility])    TIA  . TIA (transient ischemic attack)     Past Surgical History:  Procedure Laterality Date  . ABDOMINAL HYSTERECTOMY    . CAROTID STENT    . CHOLECYSTECTOMY    . DILATION AND CURETTAGE OF UTERUS    . NASAL SINUS SURGERY    . OVARIAN CYST REMOVAL    . REPLACEMENT TOTAL KNEE Right   . TONSILLECTOMY      Medications Prior to Admission  Medication Sig Dispense Refill Last Dose  . acetaminophen (TYLENOL) 325 MG  tablet Take 650 mg by mouth every 6 (six) hours as needed.   prn at prn  . Albuterol Sulfate 108 (90 Base) MCG/ACT AEPB Inhale 2 puffs into the lungs every 6 (six) hours as needed.   prn at prn  . amLODipine (NORVASC) 10 MG tablet Take 1 tablet (10 mg total) by mouth daily. 30 tablet 2 09/16/2018 at 1000  . aspirin 81 MG tablet Take 81 mg by mouth daily.   09/16/2018 at 1000  . cholecalciferol (VITAMIN D) 1000 units tablet Take 2,000 Units by mouth daily.    09/16/2018 at 1000  . furosemide (LASIX) 40 MG tablet Take 40 mg by mouth daily.   09/16/2018 at 1000  . hydrALAZINE (APRESOLINE) 25 MG tablet Take 1 tablet (25 mg total) by mouth every 8 (eight) hours. 90 tablet 2 09/16/2018 at 1400  . levothyroxine (SYNTHROID, LEVOTHROID) 100 MCG tablet Take 100 mcg by mouth daily before breakfast.    09/16/2018 at 0700  . lisinopril (PRINIVIL,ZESTRIL) 40 MG tablet Take 40 mg by mouth daily.    09/16/2018 at 1000  . loratadine (CLARITIN) 10 MG tablet Take 10 mg by mouth daily at 2 PM.    09/16/2018 at 1400  . Melatonin 5 MG TABS Take 5 mg by mouth daily at 8 pm.    09/15/2018 at 2000  . potassium chloride (MICRO-K) 10 MEQ CR capsule Take 10 mEq by mouth daily.  09/15/2018 at 2000  . pravastatin (PRAVACHOL) 40 MG tablet Take 40 mg by mouth daily at 8 pm.    09/15/2018 at 2000  . senna-docusate (SENOKOT-S) 8.6-50 MG tablet Take 1 tablet by mouth at bedtime as needed for mild constipation. 30 tablet 0 prn at prn  . sildenafil (REVATIO) 20 MG tablet Take 60 mg by mouth 3 (three) times daily.   09/16/2018 at 1400  . venlafaxine XR (EFFEXOR-XR) 150 MG 24 hr capsule Take 150 mg by mouth daily.   09/16/2018 at 1000  . venlafaxine XR (EFFEXOR-XR) 75 MG 24 hr capsule Take 75 mg by mouth daily.   09/16/2018 at 1000   Social History   Socioeconomic History  . Marital status: Widowed    Spouse name: Not on file  . Number of children: Not on file  . Years of education: Not on file  . Highest education level: Not on file   Occupational History  . Not on file  Social Needs  . Financial resource strain: Not hard at all  . Food insecurity:    Worry: Never true    Inability: Never true  . Transportation needs:    Medical: No    Non-medical: No  Tobacco Use  . Smoking status: Never Smoker  . Smokeless tobacco: Never Used  Substance and Sexual Activity  . Alcohol use: No  . Drug use: No  . Sexual activity: Never    Birth control/protection: Post-menopausal  Lifestyle  . Physical activity:    Days per week: 0 days    Minutes per session: 0 min  . Stress: Not at all  Relationships  . Social connections:    Talks on phone: Twice a week    Gets together: Twice a week    Attends religious service: Never    Active member of club or organization: No    Attends meetings of clubs or organizations: Never    Relationship status: Widowed  . Intimate partner violence:    Fear of current or ex partner: No    Emotionally abused: No    Physically abused: No    Forced sexual activity: No  Other Topics Concern  . Not on file  Social History Narrative   Lives with son, walks with walker, showers, does laundry at times.    No family history on file.    Review of systems complete and found to be negative unless listed above    PHYSICAL EXAM  General: Well developed, well nourished, in no acute distress HEENT:  Normocephalic and atramatic Neck:  No JVD.  Lungs: Clear bilaterally to auscultation and percussion. Heart: HRRR . Normal S1 and S2 without gallops or murmurs.  Extremities: Trace pitting edema bilaterally  Neuro: Alert and oriented X 3. Psych:  Good affect, responds appropriately  Labs:   Lab Results  Component Value Date   WBC 10.9 (H) 09/17/2018   HGB 11.1 (L) 09/17/2018   HCT 32.9 (L) 09/17/2018   MCV 87.7 09/17/2018   PLT 242 09/17/2018    Recent Labs  Lab 09/16/18 1717 09/17/18 0302  NA 127* 126*  K 4.8 4.4  CL 95* 93*  CO2 22 20*  BUN 33* 29*  CREATININE 1.10* 1.10*   CALCIUM 9.3 9.1  PROT 7.4  --   BILITOT 0.5  --   ALKPHOS 103  --   ALT 12  --   AST 19  --   GLUCOSE 136* 111*   Lab Results  Component Value Date  CKTOTAL 18 (L) 05/06/2017   TROPONINI <0.03 09/16/2018    Lab Results  Component Value Date   CHOL 139 06/22/2018   Lab Results  Component Value Date   HDL 50 06/22/2018   Lab Results  Component Value Date   LDLCALC 73 06/22/2018   Lab Results  Component Value Date   TRIG 82 06/22/2018   Lab Results  Component Value Date   CHOLHDL 2.8 06/22/2018   No results found for: LDLDIRECT    Radiology: Dg Chest Portable 1 View  Result Date: 09/16/2018 CLINICAL DATA:  Worsening shortness of breath. EXAM: PORTABLE CHEST 1 VIEW COMPARISON:  06/10/2018 FINDINGS: 1719 hours. Asymmetric airspace disease, right greater than left suggest multifocal pneumonia although asymmetric edema could have this appearance. Probable tiny right pleural effusion. The cardio pericardial silhouette is enlarged. The visualized bony structures of the thorax are intact. Insert wire IMPRESSION: Right greater than left airspace disease compatible with diffuse infection or asymmetric edema. Tiny right effusion. Electronically Signed   By: Misty Stanley M.D.   On: 09/16/2018 18:06    EKG: Sinus rhythm, Rate of 76 BPM, normal axis, normal intervals, T wave inversion in V2, no significant ST changes   ASSESSMENT AND PLAN:   1. Recommend repeating ECHO to reassess LV and RV function  - Suspect current symptoms are more related to her pulmonary HTN than strictly a cardiac etiology   - Recommend consultation with pulmonology for further evaluation and management of her pulmonary HTN  2. Continue gentle diuresis for fluid management   3. No further cardiac diagnostics indicated at this time.   The patient's history and exam findings were discussed with Dr. Nehemiah Massed. The plan was made in conjunction with Dr. Nehemiah Massed.  Signed: Hilbert Odor PA-C 09/17/2018,  11:04 AM  Patient interviewed and examined and agree with above assessment and recommendations  Edgar Frisk [facility]

## 2018-09-17 NOTE — Progress Notes (Signed)
[place] at [facility]                                                                                                                                                                                  Patient Demographics   Lisa Haney, is a 85 y.o. female, DOB - [birth year], CXK:[id number]  Admit date - 09/16/2018   Admitting Physician Lance Coon, MD  Outpatient Primary MD for the patient is [facility], [facility]   LOS - 1  Subjective:  Patient complaints of shortness of breath and wheezing. Complains of severe sinus pain   Review of Systems:   CONSTITUTIONAL: No documented fever. No fatigue, weakness. No weight gain, no weight loss.  EYES: No blurry or double vision.  ENT: No tinnitus. No postnasal drip. No redness of the oropharynx.  RESPIRATORY: Positive cough, positive wheeze, no hemoptysis.  Positive dyspnea.  CARDIOVASCULAR: No chest pain. No orthopnea. No palpitations. No syncope.  GASTROINTESTINAL: No nausea, no vomiting or diarrhea. No abdominal pain. No melena or hematochezia.  GENITOURINARY: No dysuria or hematuria.  ENDOCRINE: No polyuria or nocturia. No heat or cold intolerance.  HEMATOLOGY: No anemia. No bruising. No bleeding.  INTEGUMENTARY: No rashes. No lesions.  MUSCULOSKELETAL: No arthritis. No swelling. No gout.  NEUROLOGIC: No numbness, tingling, or ataxia. No seizure-type activity.  PSYCHIATRIC: No anxiety. No insomnia. No ADD.    Vitals:   Vitals:   09/17/18 0341 09/17/18 0345 09/17/18 0750 09/17/18 0835  BP: (!) 158/51  (!) 164/63 (!) 145/50  Pulse: 86  86 85  Resp: 20  16 17   Temp: 97.6 F (36.4 C)  97.6 F (36.4 C) 98.6 F (37 C)  TempSrc:   Axillary   SpO2: 97%  93% 94%  Weight:  97.8 kg    Height:        Wt Readings from Last 3 Encounters:  09/17/18 97.8 kg  06/21/18 90.7 kg  06/12/18 96.6 kg     Intake/Output Summary (Last 24 hours) at 09/17/2018 1301 Last data filed at 09/17/2018 1044 Gross  per 24 hour  Intake 579.97 ml  Output 1300 ml  Net -720.03 ml    Physical Exam:   GENERAL: Pleasant-appearing in no apparent distress.  HEAD, EYES, EARS, NOSE AND THROAT: Atraumatic, normocephalic. Extraocular muscles are intact. Pupils equal and reactive to light. Sclerae anicteric. No conjunctival injection. No oro-pharyngeal erythema.  NECK: Supple. There is no jugular venous distention. No bruits, no lymphadenopathy, no thyromegaly.  HEART: Regular rate and rhythm,. No murmurs, no rubs, no clicks.  LUNGS: Bilateral wheezing throughout both lung.  ABDOMEN: Soft, flat, nontender, nondistended. Has good bowel sounds. No hepatosplenomegaly appreciated.  EXTREMITIES:  No evidence of any cyanosis, clubbing, or positive peripheral edema.  +2 pedal and radial pulses bilaterally.  NEUROLOGIC: The patient is alert, awake, and oriented x3 with no focal motor or sensory deficits appreciated bilaterally.  SKIN: Moist and warm with no rashes appreciated.  Psych: Not anxious, depressed LN: No inguinal LN enlargement    Antibiotics   Anti-infectives (From admission, onward)   Start     Dose/Rate Route Frequency Ordered Stop   09/16/18 2200  ampicillin (OMNIPEN) 1 g in sodium chloride 0.9 % 100 mL IVPB  Status:  Discontinued     1 g 300 mL/hr over 20 Minutes Intravenous Every 8 hours 09/16/18 2017 09/16/18 2148   09/16/18 2145  Ampicillin-Sulbactam (UNASYN) 3 g in sodium chloride 0.9 % 100 mL IVPB     3 g 200 mL/hr over 30 Minutes Intravenous Every 6 hours 09/16/18 2141        Medications   Scheduled Meds: . amLODipine  10 mg Oral Daily  . aspirin EC  81 mg Oral Daily  . budesonide (PULMICORT) nebulizer solution  0.25 mg Nebulization BID  . enoxaparin (LOVENOX) injection  40 mg Subcutaneous Q24H  . fluticasone  2 spray Each Nare Daily  . furosemide  20 mg Intravenous Q12H  . hydrALAZINE  25 mg Oral Q8H  . ipratropium-albuterol  3 mL Nebulization Q6H  . levothyroxine  100 mcg Oral QAC  breakfast  . lisinopril  40 mg Oral Daily  . mouth rinse  15 mL Mouth Rinse BID  . Melatonin  5 mg Oral Q2000  . methylPREDNISolone (SOLU-MEDROL) injection  60 mg Intravenous Q6H  . pravastatin  40 mg Oral Q2000  . sodium chloride flush  10-40 mL Intracatheter Q12H  . sodium chloride flush  3 mL Intravenous Q12H  . venlafaxine XR  150 mg Oral Daily  . venlafaxine XR  75 mg Oral Daily   Continuous Infusions: . sodium chloride Stopped (09/17/18 0615)  . ampicillin-sulbactam (UNASYN) IV 3 g (09/17/18 0959)   PRN Meds:.sodium chloride, acetaminophen **OR** acetaminophen, ondansetron **OR** ondansetron (ZOFRAN) IV, sodium chloride flush, sodium chloride flush   Data Review:   Micro Results Recent Results (from the past 240 hour(s))  SARS Coronavirus 2 (CEPHEID - Performed in [place] hospital lab), Hosp Order     Status: None   Collection Time: 09/16/18  8:01 PM  Result Value Ref Range Status   SARS Coronavirus 2 NEGATIVE NEGATIVE Final    Comment: (NOTE) If result is NEGATIVE SARS-CoV-2 target nucleic acids are NOT DETECTED. The SARS-CoV-2 RNA is generally detectable in upper and lower  respiratory specimens during the acute phase of infection. The lowest  concentration of SARS-CoV-2 viral copies this assay can detect is 250  copies / mL. A negative result does not preclude SARS-CoV-2 infection  and should not be used as the sole basis for treatment or other  patient management decisions.  A negative result may occur with  improper specimen collection / handling, submission of specimen other  than nasopharyngeal swab, presence of viral mutation(s) within the  areas targeted by this assay, and inadequate number of viral copies  (<250 copies / mL). A negative result must be combined with clinical  observations, patient history, and epidemiological information. If result is POSITIVE SARS-CoV-2 target nucleic acids are DETECTED. The SARS-CoV-2 RNA is generally detectable in upper  and lower  respiratory specimens dur ing the acute phase of infection.  Positive  results are indicative of active infection with SARS-CoV-2.  Clinical  correlation with patient history and other diagnostic information is  necessary to determine patient infection status.  Positive results do  not rule out bacterial infection or co-infection with other viruses. If result is PRESUMPTIVE POSTIVE SARS-CoV-2 nucleic acids MAY BE PRESENT.   A presumptive positive result was obtained on the submitted specimen  and confirmed on repeat testing.  While 2019 novel coronavirus  (SARS-CoV-2) nucleic acids may be present in the submitted sample  additional confirmatory testing may be necessary for epidemiological  and / or clinical management purposes  to differentiate between  SARS-CoV-2 and other Sarbecovirus currently known to infect humans.  If clinically indicated additional testing with an alternate test  methodology [id number]) is advised. The SARS-CoV-2 RNA is generally  detectable in upper and lower respiratory sp ecimens during the acute  phase of infection. The expected result is Negative. Fact Sheet for Patients:  StrictlyIdeas.no Fact Sheet for Healthcare Providers: BankingDealers.co.za This test is not yet approved or cleared by the [place] FDA and has been authorized for detection and/or diagnosis of SARS-CoV-2 by FDA under an Emergency Use Authorization (EUA).  This EUA will remain in effect (meaning this test can be used) for the duration of the COVID-19 declaration under Section 564(b)(1) of the Act, 21 U.S.C. section 360bbb-3(b)(1), unless the authorization is terminated or revoked sooner. Performed at [facility], [address], [place], [place] [address]     Radiology Reports Dg Chest Portable 1 View  Result Date: 09/16/2018 CLINICAL DATA:  Worsening shortness of breath. EXAM: PORTABLE CHEST 1 VIEW COMPARISON:   06/10/2018 FINDINGS: 1719 hours. Asymmetric airspace disease, right greater than left suggest multifocal pneumonia although asymmetric edema could have this appearance. Probable tiny right pleural effusion. The cardio pericardial silhouette is enlarged. The visualized bony structures of the thorax are intact. Insert wire IMPRESSION: Right greater than left airspace disease compatible with diffuse infection or asymmetric edema. Tiny right effusion. Electronically Signed   By: Misty Stanley M.D.   On: 09/16/2018 18:06     CBC Recent Labs  Lab 09/16/18 1717 09/17/18 0302  WBC 7.9 10.9*  HGB 11.0* 11.1*  HCT 32.6* 32.9*  PLT 256 242  MCV 88.8 87.7  MCH 30.0 29.6  MCHC 33.7 33.7  RDW 14.5 14.3  LYMPHSABS 1.4  --   MONOABS 0.5  --   EOSABS 0.2  --   BASOSABS 0.1  --     Chemistries  Recent Labs  Lab 09/16/18 1717 09/17/18 0302  NA 127* 126*  K 4.8 4.4  CL 95* 93*  CO2 22 20*  GLUCOSE 136* 111*  BUN 33* 29*  CREATININE 1.10* 1.10*  CALCIUM 9.3 9.1  AST 19  --   ALT 12  --   ALKPHOS 103  --   BILITOT 0.5  --    ------------------------------------------------------------------------------------------------------------------ estimated creatinine clearance is 40.9 mL/min (A) (by C-G formula based on SCr of 1.1 mg/dL (H)). ------------------------------------------------------------------------------------------------------------------ No results for input(s): HGBA1C in the last 72 hours. ------------------------------------------------------------------------------------------------------------------ No results for input(s): CHOL, HDL, LDLCALC, TRIG, CHOLHDL, LDLDIRECT in the last 72 hours. ------------------------------------------------------------------------------------------------------------------ No results for input(s): TSH, T4TOTAL, T3FREE, THYROIDAB in the last 72 hours.  Invalid input(s):  FREET3 ------------------------------------------------------------------------------------------------------------------ No results for input(s): VITAMINB12, FOLATE, FERRITIN, TIBC, IRON, RETICCTPCT in the last 72 hours.  Coagulation profile No results for input(s): INR, PROTIME in the last 168 hours.  No results for input(s): DDIMER in the last 72 hours.  Cardiac Enzymes Recent Labs  Lab 09/16/18 1717  TROPONINI <0.03   ------------------------------------------------------------------------------------------------------------------ Invalid input(s): POCBNP    Assessment & Plan   Patient is 85 year old presenting with shortness of breath  1 acute on chronic respiratory failure I suspect this is multifactorial including due to acute CHF, acute COPD flare possible pneumonia  2 acute on chronic diastolic CHF patient given oral Lasix I will give patient IV Lasix  3 acute on chronic COPD exasperation we will change her nebs to every 6, add Pulmicort continue Solu-Medrol  4.  Hyponatremia likely due to fluid overload we will follow sodium check serum and urine osmolality  5.    HTN (hypertension) -continue hydralazine lisinopril  6.  CAD (coronary artery disease) -continue home medications with aspirin  7.  Hypothyroidism continue Synthroid  8.  Hyperlipidemia continue Pravachol  9.  Severe pulmonary hypertension resume Revatio  10.  Miscellaneous heparin for DVT prophylaxis     Code Status Orders  (From admission, onward)         Start     Ordered   09/17/18 1057  Do not attempt resuscitation (DNR)  Continuous    Question Answer Comment  In the event of cardiac or respiratory ARREST Do not call a "code blue"   In the event of cardiac or respiratory ARREST Do not perform Intubation, CPR, defibrillation or ACLS   In the event of cardiac or respiratory ARREST Use medication by any route, position, wound care, and other measures to relive pain and suffering. May use  oxygen, suction and manual treatment of airway obstruction as needed for comfort.      09/17/18 1056        Code Status History    Date Active Date Inactive Code Status Order ID Comments User Context   09/16/2018 2306 09/17/2018 1056 Full Code [id number]  Lance Coon, MD Inpatient   06/22/2018 1823 06/26/2018 1816 Full Code [id number]  Lance Coon, MD Inpatient   06/11/2018 0412 06/12/2018 1849 Full Code [id number]  Harrie Foreman, MD Inpatient   05/14/2017 1927 05/17/2017 1739 Full Code [id number]  Demetrios Loll, MD Inpatient   05/04/2017 1755 05/09/2017 1507 Full Code [id number]  Fritzi Mandes, MD Inpatient   06/21/2016 0959 06/21/2016 1413 Full Code [id number]  Isaias Cowman, MD Inpatient   02/13/2016 0417 02/15/2016 2021 Full Code [id number]  Hugelmeyer, Ubaldo Glassing, DO Inpatient           Consults cards  DVT Prophylaxis  Lovenox   Lab Results  Component Value Date   PLT 242 09/17/2018     Time Spent in minutes   75min  Greater than 50% of time spent in care coordination and counseling patient regarding the condition and plan of care.   Dustin Flock M.D on 09/17/2018 at 1:01 PM  Between 7am to 6pm - Pager - [phone]  After 6pm go to [email or web address] - Proofreader  [employer]   Office  [phone]

## 2018-09-17 NOTE — TOC Initial Note (Signed)
Transition of Care [facility]) - Initial/Assessment Note    Patient Details  Name: Lisa Haney MRN: [id number] Date of Birth: 1927-03-17  Transition of Care [facility]) CM/SW Contact:    Elza Rafter, RN Phone Number: 09/17/2018, 9:43 AM  Clinical Narrative:        Patient has been admitted with SOB; acute on chronic CHF.  From home with son.  She uses a walker.  Has chronic oxygen at 2L.  Currently oxygen is at baseline.  Has a CPAP machine at home.  Current with Dr. Ouida Sills; PCP.  Obtains medications without difficulty.  Son transports to appointments.     Has a functioning scale but states she doesn't weigh because she is scared she will fall.  Has a heart failure clinic appointment scheduled.  She was recently at [facility] for [facility].  Will ask for PT evaluation prior to DC.             Barriers to Discharge: Continued Medical Work up   Patient Goals and CMS Choice        Expected Discharge Plan and Services         Living arrangements for the past 2 months: Single Family Home                                      Prior Living Arrangements/Services Living arrangements for the past 2 months: Single Family Home Lives with:: Adult Children   Do you feel safe going back to the place where you live?: Yes               Activities of Daily Living Home Assistive Devices/Equipment: CPAP, Oxygen ADL Screening (condition at time of admission) Patient's cognitive ability adequate to safely complete daily activities?: Yes Is the patient deaf or have difficulty hearing?: Yes Does the patient have difficulty seeing, even when wearing glasses/contacts?: No Does the patient have difficulty concentrating, remembering, or making decisions?: No Patient able to express need for assistance with ADLs?: Yes Does the patient have difficulty dressing or bathing?: Yes Independently performs ADLs?: Yes (appropriate for developmental age) Does the patient have difficulty walking  or climbing stairs?: Yes Weakness of Legs: Both Weakness of Arms/Hands: None  Permission Sought/Granted                  Emotional Assessment Appearance:: Appears stated age Attitude/Demeanor/Rapport: Gracious Affect (typically observed): Accepting Orientation: : Oriented to Self, Oriented to Place, Oriented to  Time, Oriented to Situation Alcohol / Substance Use: Not Applicable    Admission diagnosis:  Hyponatremia [E87.1] Hypoxia [R09.02] COPD exacerbation (HCC) [J44.1] Congestive heart failure, unspecified HF chronicity, unspecified heart failure type [facility]) [I50.9] Patient Active Problem List   Diagnosis Date Noted  . COPD exacerbation ([place]) 09/16/2018  . Acute on chronic diastolic CHF (congestive heart failure) ([place]) 09/16/2018  . HTN (hypertension) 06/22/2018  . CAD (coronary artery disease) 06/22/2018  . Aphasia 06/22/2018  . Hypertensive urgency 06/11/2018  . Pneumonia 05/04/2017  . COPD (chronic obstructive pulmonary disease) ([place]) 02/13/2016   PCP:  Glendon Axe, MD Pharmacy:   [facility], [address] [place] [address] Phone: [phone] Fax: [phone]     Social Determinants of Health (SDOH) Interventions    Readmission Risk Interventions Readmission Risk Prevention Plan 09/17/2018  Transportation Screening Complete  PCP or Specialist Appt  within 3-5 Days Complete  HRI or Home Care Consult Complete  Social Work Consult for [place] Planning/Counseling Not Complete  SW consult not completed comments na  Palliative Care Screening Not Applicable  Medication Review (RN Care Manager) Complete  Some recent data might be hidden

## 2018-09-17 NOTE — Plan of Care (Signed)
  Problem: Education: Goal: Ability to verbalize understanding of medication therapies will improve Outcome: Progressing   Problem: Education: Goal: Individualized Educational Video(s) Outcome: Progressing   

## 2018-09-17 NOTE — Plan of Care (Signed)
Nutrition Education Note RD working remotely.  RD consulted for nutrition education regarding CHF.  Spoke with patient over the phone. She reports she has a good appetite and intake at baseline. Breakfast is usually cheerios in milk with 1/2 banana. She has a late lunch brought over by her son. Then she has a snack later at night. She occasionally drinks a protein shake if she misses a meal. She does not add salt at cooking or at table.   RD reviewed "Heart Failure Nutrition Therapy" handout from the Academy of [employer] with patient over the phone. Will mail handout to patient. Reviewed patient's dietary recall. Provided examples on ways to decrease sodium intake in diet. Discouraged intake of processed foods and use of salt shaker. Encouraged fresh fruits and vegetables as well as whole grain sources of carbohydrates to maximize fiber intake.   RD discussed why it is important for patient to adhere to diet recommendations, and emphasized the role of fluids, foods to avoid, and importance of weighing self daily. Teach back method used.  Expect good compliance.  Body mass index is 34.8 kg/m. Pt meets criteria for obesity class II based on current BMI.  Current diet order is heart healthy, patient is consuming approximately 95% of meals at this time. Labs and medications reviewed. No further nutrition interventions warranted at this time. RD contact information provided. If additional nutrition issues arise, please re-consult RD.   Willey Blade, MS, [place], LDN Office: [phone] Pager: [phone] After Hours/Weekend Pager: [phone]

## 2018-09-17 NOTE — Progress Notes (Signed)
Advanced care plan.  Purpose of the Encounter: CODE STATUS  Parties in Attendance: pt her self  Patient's Decision Capacity:intact  Subjective/Patient's story: :Patient is 85 y/o with h/o copd, severe pulmonary htn on chronic oxygen therapypresents the ED with progressive cough and shortness of breath.  She states that her son has been coughing for about a month, and she has been coughing for about a week.  Over the last 2 days or so her shortness of breath is gotten worse.  Today she had significant wheezing per her report.  Here in the ED imaging shows right-sided opacity representing either infection or atypical edema        Objective/Medical story  I d/w patient regarding her desires for cardiac and pulm rescussiation and also living will and helth care power of attorney   Pt states she would like to be dnr and would not want intubation or cpr, also I d/w with her regarding health care poa and living will   Goals of care determination:  dnr   CODE STATUS: dnr    Time spent discussing advanced care planning: 16 minutes

## 2018-09-18 LAB — BASIC METABOLIC PANEL
Anion gap: 14 (ref 5–15)
BUN: 34 mg/dL — ABNORMAL HIGH (ref 8–23)
CO2: 19 mmol/L — ABNORMAL LOW (ref 22–32)
Calcium: 9.1 mg/dL (ref 8.9–10.3)
Chloride: 98 mmol/L (ref 98–111)
Creatinine, Ser: 1.3 mg/dL — ABNORMAL HIGH (ref 0.44–1.00)
GFR calc Af Amer: 42 mL/min — ABNORMAL LOW (ref >60)
GFR calc non Af Amer: 36 mL/min — ABNORMAL LOW (ref >60)
Glucose, Bld: 142 mg/dL — ABNORMAL HIGH (ref 70–99)
Potassium: 4 mmol/L (ref 3.5–5.1)
Sodium: 131 mmol/L — ABNORMAL LOW (ref 135–145)

## 2018-09-18 LAB — CBC
HCT: 33.3 % — ABNORMAL LOW (ref 36.0–46.0)
Hemoglobin: 11 g/dL — ABNORMAL LOW (ref 12.0–15.0)
MCH: 29.5 pg (ref 26.0–34.0)
MCHC: 33 g/dL (ref 30.0–36.0)
MCV: 89.3 fL (ref 80.0–100.0)
Platelets: 255 10*3/uL (ref 150–400)
RBC: 3.73 MIL/uL — ABNORMAL LOW (ref 3.87–5.11)
RDW: 14.3 % (ref 11.5–15.5)
WBC: 12.4 10*3/uL — ABNORMAL HIGH (ref 4.0–10.5)
nRBC: 0 % (ref 0.0–0.2)

## 2018-09-18 MED ORDER — POLYETHYLENE GLYCOL 3350 17 G PO PACK
17.0000 g | PACK | Freq: Every day | ORAL | Status: DC
  Administered 2018-09-18 – 2018-09-20 (×3): 17 g via ORAL
  Filled 2018-09-18 (×4): qty 1

## 2018-09-18 MED ORDER — PSYLLIUM 95 % PO PACK
1.0000 | PACK | Freq: Every day | ORAL | Status: DC
  Administered 2018-09-18 – 2018-09-20 (×3): 1 via ORAL
  Filled 2018-09-18 (×3): qty 1

## 2018-09-18 MED ORDER — FUROSEMIDE 10 MG/ML IJ SOLN
40.0000 mg | Freq: Two times a day (BID) | INTRAMUSCULAR | Status: DC
  Administered 2018-09-19 – 2018-09-20 (×3): 40 mg via INTRAVENOUS
  Filled 2018-09-18 (×3): qty 4

## 2018-09-18 MED ORDER — DOCUSATE SODIUM 100 MG PO CAPS
100.0000 mg | ORAL_CAPSULE | Freq: Two times a day (BID) | ORAL | Status: DC
  Administered 2018-09-18 – 2018-09-20 (×6): 100 mg via ORAL
  Filled 2018-09-18 (×6): qty 1

## 2018-09-18 NOTE — Progress Notes (Signed)
[facility] Encounter Note  Patient: Lisa Haney / Admit Date: 09/16/2018 / Date of Encounter: 09/18/2018, 7:59 AM   Subjective: Patient's breathing is slightly improved since yesterday with inhalers as well as diuretics.  Overall systolic hypertension well controlled with ACE inhibitor hydralazine amlodipine combination.  Minimal elevation of troponin most consistent with demand ischemia rather than acute coronary syndrome.  Lower extremity edema significantly improved with Lasix.   Echocardiogram with previous admission showing normal LV systolic function with significant pulmonary hypertension most consistent with previous lung disease  Review of Systems: Positive for: Shortness of breath lower extremity edema Negative for: Vision change, hearing change, syncope, dizziness, nausea, vomiting,diarrhea, bloody stool, stomach pain, cough, congestion, diaphoresis, urinary frequency, urinary pain,skin lesions, skin rashes Others previously listed  Objective: Telemetry: Normal sinus rhythm Physical Exam: Blood pressure (!) 148/55, pulse 90, temperature (!) 97.5 F (36.4 C), temperature source Oral, resp. rate 20, height 5\' 6"  (1.676 m), weight 98 kg, SpO2 96 %. Body mass index is 34.88 kg/m. General: Well developed, well nourished, in no acute distress. Head: Normocephalic, atraumatic, sclera non-icteric, no xanthomas, nares are without discharge. Neck: No apparent masses Lungs: Normal respirations with some wheezes, no rhonchi, no rales , few crackles   Heart: Regular rate and rhythm, normal S1 S2, no murmur, no rub, no gallop, PMI is normal size and placement, carotid upstroke normal without bruit, jugular venous pressure normal Abdomen: Soft, non-tender, non-distended with normoactive bowel sounds. No hepatosplenomegaly. Abdominal aorta is normal size without bruit Extremities: Trace to 1+ edema, no clubbing, no cyanosis, no ulcers,  Peripheral: 2+ radial, 2+  femoral, 2+ dorsal pedal pulses Neuro: Alert and oriented. Moves all extremities spontaneously. Psych:  Responds to questions appropriately with a normal affect.   Intake/Output Summary (Last 24 hours) at 09/18/2018 0759 Last data filed at 09/18/2018 0426 Gross per 24 hour  Intake 675.96 ml  Output 1100 ml  Net -424.04 ml    Inpatient Medications:  . amLODipine  10 mg Oral Daily  . aspirin EC  81 mg Oral Daily  . budesonide (PULMICORT) nebulizer solution  0.25 mg Nebulization BID  . docusate sodium  100 mg Oral BID  . enoxaparin (LOVENOX) injection  40 mg Subcutaneous Q24H  . fluticasone  2 spray Each Nare Daily  . furosemide  20 mg Intravenous Q12H  . hydrALAZINE  25 mg Oral Q8H  . ipratropium-albuterol  3 mL Nebulization TID  . levothyroxine  100 mcg Oral QAC breakfast  . lisinopril  40 mg Oral Daily  . loratadine  10 mg Oral Q1400  . mouth rinse  15 mL Mouth Rinse BID  . Melatonin  5 mg Oral Q2000  . methylPREDNISolone (SOLU-MEDROL) injection  60 mg Intravenous Q6H  . polyethylene glycol  17 g Oral Daily  . pravastatin  40 mg Oral Q2000  . psyllium  1 packet Oral Daily  . sildenafil  60 mg Oral TID  . sodium chloride flush  10-40 mL Intracatheter Q12H  . sodium chloride flush  3 mL Intravenous Q12H  . venlafaxine XR  150 mg Oral Daily  . venlafaxine XR  75 mg Oral Daily   Infusions:  . sodium chloride 10 mL/hr at 09/17/18 2300  . ampicillin-sulbactam (UNASYN) IV 3 g (09/18/18 0347)    Labs: Recent Labs    09/17/18 0302 09/18/18 0654  NA 126* 131*  K 4.4 4.0  CL 93* 98  CO2 20* 19*  GLUCOSE 111* 142*  BUN 29* 34*  CREATININE 1.10* 1.30*  CALCIUM 9.1 9.1   Recent Labs    09/16/18 1717  AST 19  ALT 12  ALKPHOS 103  BILITOT 0.5  PROT 7.4  ALBUMIN 3.7   Recent Labs    09/16/18 1717 09/17/18 0302 09/18/18 0654  WBC 7.9 10.9* 12.4*  NEUTROABS 5.7  --   --   HGB 11.0* 11.1* 11.0*  HCT 32.6* 32.9* 33.3*  MCV 88.8 87.7 89.3  PLT 256 242 255    Recent Labs    09/16/18 1717  TROPONINI <0.03   Invalid input(s): POCBNP No results for input(s): HGBA1C in the last 72 hours.   Weights: Filed Weights   09/16/18 2306 09/17/18 0345 09/18/18 0426  Weight: 98 kg 97.8 kg 98 kg     Radiology/Studies:  Dg Chest Portable 1 View  Result Date: 09/16/2018 CLINICAL DATA:  Worsening shortness of breath. EXAM: PORTABLE CHEST 1 VIEW COMPARISON:  06/10/2018 FINDINGS: 1719 hours. Asymmetric airspace disease, right greater than left suggest multifocal pneumonia although asymmetric edema could have this appearance. Probable tiny right pleural effusion. The cardio pericardial silhouette is enlarged. The visualized bony structures of the thorax are intact. Insert wire IMPRESSION: Right greater than left airspace disease compatible with diffuse infection or asymmetric edema. Tiny right effusion. Electronically Signed   By: Misty Stanley M.D.   On: 09/16/2018 18:06   Ct Maxillofacial Wo Contrast  Result Date: 09/17/2018 CLINICAL DATA:  Fever and facial pain EXAM: CT MAXILLOFACIAL WITHOUT CONTRAST TECHNIQUE: Multidetector CT imaging of the maxillofacial structures was performed. Multiplanar CT image reconstructions were also generated. COMPARISON:  None. FINDINGS: Osseous: No facial fracture. Dental: The patient is edentulous. Orbits: The globes are intact. Normal appearance of the intra- and extraconal fat. Symmetric extraocular muscles. Sinuses: No fluid levels or advanced mucosal thickening. Soft tissues: Normal visualized extracranial soft tissues. Limited intracranial: Normal. IMPRESSION: Clear sinuses.  No specific finding to explain facial pain. Electronically Signed   By: Ulyses Jarred M.D.   On: 09/17/2018 13:43     Assessment and Recommendation  85 y.o. female with acute on chronic diastolic dysfunction congestive heart failure minimal in nature and majority of symptoms most consistent with pulmonary hypertension and lung disease without evidence  of myocardial infarction 1.  Gentle diuresis with Lasix for acute on chronic diastolic dysfunction congestive heart failure likely secondary to age and significant pulmonary hypertension 2.  Continue hypertension control for acute on chronic diastolic dysfunction congestive heart failure 3.  Further consideration for consultation with pulmonology for pulmonary hypertension treatment options beyond current treatment 4.  No further cardiac diagnostics necessary at this time due to stability and improvement of diastolic dysfunction heart failure 5.  Call if further questions  Signed, Serafina Royals M.D. FACC

## 2018-09-18 NOTE — Consult Note (Signed)
Pulmonary Medicine          Date: 09/18/2018,   MRN# [id number] Lisa Haney Nov 28, 1926     AdmissionWeight: 81.6 kg                 CurrentWeight: 98 kg      CHIEF COMPLAINT:   Worsening dyspnea   HISTORY OF PRESENT ILLNESS   This is a pleasant 85 year old female with a history of essential hypertension, systolic and diastolic CHF possible pulmonary hypertension, OSA on CPAP, history of bacterial pneumonia, COPD, CVA 3 years ago, history of encephalopathy, CODE STATUS DNR.  She was brought in to ED due to worsening cough x1 week.  She reports that over the last few weeks family members have also been sick with cough and shortness of breath.  She does endorse grossly appreciable wheezing.  On review of her work-up initial WBC count was without leukocytosis, she did have novel coronavirus testing which was negative, she was initiated with appropriate COPD care path including IV Solu-Medrol and antibiotics with typical nebulizers and continuation of her home regimen.  She was also diuresed with IV Lasix on admission.  BMP is significant for acute on chronic kidney injury stage II, procalcitonin was normal, BNP was mildly elevated at 208 with baseline around 300.  She feels slightly improved from initial bout of SOB but still not to baseline and is currently on 4L/min [place] while at home she uses 2L. She does have CPAP setup up in room already for OSA.   PAST MEDICAL HISTORY   Past Medical History:  Diagnosis Date  . Arthritis   . Cancer ([place])    skin cancer on hand  . COPD (chronic obstructive pulmonary disease) ([place])   . Coronary artery disease    stent  . Diverticulitis   . Hypertension   . Stroke [facility]    TIA  . TIA (transient ischemic attack)      SURGICAL HISTORY   Past Surgical History:  Procedure Laterality Date  . ABDOMINAL HYSTERECTOMY    . CAROTID STENT    . CHOLECYSTECTOMY    . DILATION AND CURETTAGE OF UTERUS    . NASAL SINUS SURGERY    .  OVARIAN CYST REMOVAL    . REPLACEMENT TOTAL KNEE Right   . TONSILLECTOMY       FAMILY HISTORY   No family history on file.   SOCIAL HISTORY   Social History   Tobacco Use  . Smoking status: Never Smoker  . Smokeless tobacco: Never Used  Substance Use Topics  . Alcohol use: No  . Drug use: No     MEDICATIONS    Home Medication:    Current Medication:  Current Facility-Administered Medications:  .  0.9 %  sodium chloride infusion, , Intravenous, PRN, Lance Coon, MD, Last Rate: 10 mL/hr at 09/18/18 0949, 500 mL at 09/18/18 0949 .  acetaminophen (TYLENOL) tablet 650 mg, 650 mg, Oral, Q6H PRN, 650 mg at 09/17/18 1636 **OR** acetaminophen (TYLENOL) suppository 650 mg, 650 mg, Rectal, Q6H PRN, Lance Coon, MD .  albuterol (PROVENTIL) (2.5 MG/3ML) 0.083% nebulizer solution 2.5 mg, 2.5 mg, Nebulization, Q2H PRN, Dustin Flock, MD .  amLODipine (NORVASC) tablet 10 mg, 10 mg, Oral, Daily, Lance Coon, MD, 10 mg at 09/18/18 0926 .  Ampicillin-Sulbactam (UNASYN) 3 g in sodium chloride 0.9 % 100 mL IVPB, 3 g, Intravenous, Q6H, Lance Coon, MD, Last Rate: 200 mL/hr at 09/18/18 0950, 3 g at 09/18/18 0950 .  aspirin EC tablet 81 mg, 81 mg, Oral, Daily, Lance Coon, MD, 81 mg at 09/18/18 0926 .  budesonide (PULMICORT) nebulizer solution 0.25 mg, 0.25 mg, Nebulization, BID, Dustin Flock, MD, 0.25 mg at 09/18/18 0751 .  docusate sodium (COLACE) capsule 100 mg, 100 mg, Oral, BID, Seals, Angela H, NP, 100 mg at 09/18/18 0925 .  enoxaparin (LOVENOX) injection 40 mg, 40 mg, Subcutaneous, Q24H, Lance Coon, MD, 40 mg at 09/17/18 2308 .  fluticasone (FLONASE) 50 MCG/ACT nasal spray 2 spray, 2 spray, Each Nare, Daily, Dustin Flock, MD, 2 spray at 09/18/18 0927 .  furosemide (LASIX) injection 20 mg, 20 mg, Intravenous, Q12H, Dustin Flock, MD, 20 mg at 09/17/18 2308 .  hydrALAZINE (APRESOLINE) tablet 25 mg, 25 mg, Oral, Q8H, Lance Coon, MD, 25 mg at 09/18/18 0630 .   ipratropium-albuterol (DUONEB) 0.5-2.5 (3) MG/3ML nebulizer solution 3 mL, 3 mL, Nebulization, TID, Dustin Flock, MD, 3 mL at 09/18/18 0751 .  levothyroxine (SYNTHROID) tablet 100 mcg, 100 mcg, Oral, QAC breakfast, Lance Coon, MD, 100 mcg at 09/18/18 0926 .  lisinopril (ZESTRIL) tablet 40 mg, 40 mg, Oral, Daily, Lance Coon, MD, 40 mg at 09/18/18 0924 .  loratadine (CLARITIN) tablet 10 mg, 10 mg, Oral, Q1400, Dustin Flock, MD, 10 mg at 09/17/18 1342 .  MEDLINE mouth rinse, 15 mL, Mouth Rinse, BID, Dustin Flock, MD, 15 mL at 09/17/18 0954 .  Melatonin TABS 5 mg, 5 mg, Oral, Q2000, Lance Coon, MD, 5 mg at 09/17/18 2156 .  methylPREDNISolone sodium succinate (SOLU-MEDROL) 125 mg/2 mL injection 60 mg, 60 mg, Intravenous, Q6H, Lance Coon, MD, 60 mg at 09/18/18 3151 .  ondansetron (ZOFRAN) tablet 4 mg, 4 mg, Oral, Q6H PRN **OR** ondansetron (ZOFRAN) injection 4 mg, 4 mg, Intravenous, Q6H PRN, Lance Coon, MD .  polyethylene glycol (MIRALAX / GLYCOLAX) packet 17 g, 17 g, Oral, Daily, Seals, Angela H, NP, 17 g at 09/18/18 0932 .  pravastatin (PRAVACHOL) tablet 40 mg, 40 mg, Oral, Q2000, Lance Coon, MD, 40 mg at 09/17/18 2157 .  psyllium (HYDROCIL/METAMUCIL) packet 1 packet, 1 packet, Oral, Daily, Seals, Theo Dills, NP, 1 packet at 09/18/18 7616 .  sildenafil (REVATIO) tablet 60 mg, 60 mg, Oral, TID, Dustin Flock, MD, 60 mg at 09/18/18 0923 .  sodium chloride flush (NS) 0.9 % injection 10-40 mL, 10-40 mL, Intracatheter, PRN, Lance Coon, MD .  sodium chloride flush (NS) 0.9 % injection 10-40 mL, 10-40 mL, Intracatheter, Q12H, Dustin Flock, MD, 10 mL at 09/18/18 0955 .  sodium chloride flush (NS) 0.9 % injection 10-40 mL, 10-40 mL, Intracatheter, PRN, Dustin Flock, MD .  venlafaxine XR (EFFEXOR-XR) 24 hr capsule 150 mg, 150 mg, Oral, Daily, Lance Coon, MD, 150 mg at 09/18/18 0924 .  venlafaxine XR (EFFEXOR-XR) 24 hr capsule 75 mg, 75 mg, Oral, Daily, Lance Coon, MD, 75  mg at 09/18/18 0737    ALLERGIES   Doxycycline     REVIEW OF SYSTEMS    Review of Systems:  Gen:  Denies  fever, sweats, chills weigh loss  HEENT: Denies blurred vision, double vision, ear pain, eye pain, hearing loss, nose bleeds, sore throat Cardiac:  No dizziness, chest pain or heaviness, chest tightness,edema Resp:   Reports cough and shortness of breath Gi: Denies swallowing difficulty, stomach pain, nausea or vomiting, diarrhea, constipation, bowel incontinence Gu:  Denies bladder incontinence, burning urine Ext:   Denies Joint pain, stiffness or swelling Skin: Denies  skin rash, easy bruising or bleeding or hives Endoc:  Denies  polyuria, polydipsia , polyphagia or weight change Psych:   Denies depression, insomnia or hallucinations   Other:  All other systems negative   VS: BP (!) 128/54 (BP Location: Right Arm)   Pulse 94   Temp 98.2 F (36.8 C)   Resp 19   Ht 5\' 6"  (1.676 m)   Wt 98 kg   SpO2 95%   BMI 34.88 kg/m      PHYSICAL EXAM    GENERAL:NAD, no fevers, chills, no weakness no fatigue HEAD: Normocephalic, atraumatic.  EYES: Pupils equal, round, reactive to light. Extraocular muscles intact. No scleral icterus.  MOUTH: Moist mucosal membrane. Dentition intact. No abscess noted.  EAR, NOSE, THROAT: Clear without exudates. No external lesions.  NECK: Supple. No thyromegaly. No nodules. No JVD.  PULMONARY: Decreased breath sounds bilaterally  cARDIOVASCULAR: S1 and S2. Regular rate and rhythm. No murmurs, rubs, or gallops. 1+ LE edema. Pedal pulses 2+ bilaterally.  GASTROINTESTINAL: Soft, nontender, nondistended. No masses. Positive bowel sounds. No hepatosplenomegaly.  MUSCULOSKELETAL: No swelling, clubbing, or edema. Range of motion full in all extremities.  NEUROLOGIC: Cranial nerves II through XII are intact. No gross focal neurological deficits. Sensation intact. Reflexes intact.  SKIN: No ulceration, lesions, rashes, or cyanosis. Skin warm and  dry. Turgor intact.  PSYCHIATRIC: Mood, affect within normal limits. The patient is awake, alert and oriented x 3. Insight, judgment intact.       IMAGING    Dg Chest Portable 1 View  Result Date: 09/16/2018 CLINICAL DATA:  Worsening shortness of breath. EXAM: PORTABLE CHEST 1 VIEW COMPARISON:  06/10/2018 FINDINGS: 1719 hours. Asymmetric airspace disease, right greater than left suggest multifocal pneumonia although asymmetric edema could have this appearance. Probable tiny right pleural effusion. The cardio pericardial silhouette is enlarged. The visualized bony structures of the thorax are intact. Insert wire IMPRESSION: Right greater than left airspace disease compatible with diffuse infection or asymmetric edema. Tiny right effusion. Electronically Signed   By: Misty Stanley M.D.   On: 09/16/2018 18:06   Ct Maxillofacial Wo Contrast  Result Date: 09/17/2018 CLINICAL DATA:  Fever and facial pain EXAM: CT MAXILLOFACIAL WITHOUT CONTRAST TECHNIQUE: Multidetector CT imaging of the maxillofacial structures was performed. Multiplanar CT image reconstructions were also generated. COMPARISON:  None. FINDINGS: Osseous: No facial fracture. Dental: The patient is edentulous. Orbits: The globes are intact. Normal appearance of the intra- and extraconal fat. Symmetric extraocular muscles. Sinuses: No fluid levels or advanced mucosal thickening. Soft tissues: Normal visualized extracranial soft tissues. Limited intracranial: Normal. IMPRESSION: Clear sinuses.  No specific finding to explain facial pain. Electronically Signed   By: Ulyses Jarred M.D.   On: 09/17/2018 13:43         TTE 2019 INTERPRETATION ---------------------------------------------------------------   NORMAL LEFT VENTRICULAR SYSTOLIC FUNCTION WITH MILD LVH   MILD RV SYSTOLIC DYSFUNCTION (See above)   VALVULAR REGURGITATION: MODERATE AR, TRIVIAL MR, MILD TR   NO VALVULAR STENOSIS   MILD TO MODERATE AR   SEVERE PULMONARY  HYPERTENSION WITH RVSP=45mmHg    Compared with prior Echo study on 06/11/2017: RV IS LARGER AND RV FUNCTION   IS DEPRESSED   (Report version 3.0)                    Interpreted and Electronically signed  Perform. by: Nyra Market, RDCS                  by: Ignatius Specking, MD  Resp.Person: Nyra Market, RDCS  On: 12/05/2017 16:19:48 ASSESSMENT/PLAN     Acute hypoxemic respiratory failure    - CT chest with bilateral diffuse airspace ground glass opacification    - initially with hypothermia T96 and tachypnea RR26 with accelerated htn    - possible viral LRTI related- COVID negative , will order RVP now    - Blood cultures neg to date     - Empirically on Unasyn IV 3g Q6H    - will order ferritin to evalute acute phase reaction    - BetaDglucan for eval for fungal etiology      Pulmonary arterial hyertension     -NYHA/WHO class 4 at this time    -Group 2-3 most likely     - Severe - Mean PAP >50, appears to be both pre and post capillary although no CO/CI found on RHC    - Currenlty on Revatio TID    - would not recommend aggressive therapy as pt is DNR and has high risk for decompensation      - will incerase diuresis to 40 BID IV     - overall very poor prognosis -REVEAL registry score >16 =  less then 1 year survival      COPD    - unstaged - patient has not been followed by pulmonary in past    - discussed pulmonary clinic evaluation post d/c with patient - will set up appt   - currently on Solumedrol 60 q6h - will changed to bid    - continue Duoneb q6h prn and pulmicort    - chest physiotherapy and incentive spirometry                   Thank you for allowing me to participate in the care of this patient.   Patient/Family are satisfied with care plan and all questions have been answered.  This document was prepared using Dragon voice recognition software and may include unintentional dictation errors.     Ottie Glazier, M.D.   Division of [place]

## 2018-09-18 NOTE — Progress Notes (Signed)
[place] at [facility]                                                                                                                                                                                  Patient Demographics   Lisa Haney, is a 85 y.o. female, DOB - 04-Mar-1927, CXK:[id number]  Admit date - 09/16/2018   Admitting Physician Lance Coon, MD  Outpatient Primary MD for the patient is Kirk Ruths, MD   LOS - 2  Subjective: Patient states that her breathing is worse she is having more shortness of breath, denies any chest pain   Review of Systems:   CONSTITUTIONAL: No documented fever. No fatigue, weakness. No weight gain, no weight loss.  EYES: No blurry or double vision.  ENT: No tinnitus. No postnasal drip. No redness of the oropharynx.  RESPIRATORY: Positive cough, positive wheeze, no hemoptysis.  Positive dyspnea.  CARDIOVASCULAR: No chest pain. No orthopnea. No palpitations. No syncope.  GASTROINTESTINAL: No nausea, no vomiting or diarrhea. No abdominal pain. No melena or hematochezia.  GENITOURINARY: No dysuria or hematuria.  ENDOCRINE: No polyuria or nocturia. No heat or cold intolerance.  HEMATOLOGY: No anemia. No bruising. No bleeding.  INTEGUMENTARY: No rashes. No lesions.  MUSCULOSKELETAL: No arthritis. No swelling. No gout.  NEUROLOGIC: No numbness, tingling, or ataxia. No seizure-type activity.  PSYCHIATRIC: No anxiety. No insomnia. No ADD.    Vitals:   Vitals:   09/17/18 2231 09/18/18 0415 09/18/18 0426 09/18/18 0828  BP:  (!) 148/55  (!) 128/54  Pulse: 87 90  94  Resp: 20 20  19   Temp:  (!) 97.5 F (36.4 C)  98.2 F (36.8 C)  TempSrc:  Oral    SpO2: 92% 96%  95%  Weight:   98 kg   Height:        Wt Readings from Last 3 Encounters:  09/18/18 98 kg  06/21/18 90.7 kg  06/12/18 96.6 kg     Intake/Output Summary (Last 24 hours) at 09/18/2018 1242 Last data filed at 09/18/2018 1000 Gross per 24 hour   Intake 315.96 ml  Output 900 ml  Net -584.04 ml    Physical Exam:   GENERAL: Pleasant-appearing in no apparent distress.  HEAD, EYES, EARS, NOSE AND THROAT: Atraumatic, normocephalic. Extraocular muscles are intact. Pupils equal and reactive to light. Sclerae anicteric. No conjunctival injection. No oro-pharyngeal erythema.  NECK: Supple. There is no jugular venous distention. No bruits, no lymphadenopathy, no thyromegaly.  HEART: Regular rate and rhythm,. No murmurs, no rubs, no clicks.  LUNGS: Bilateral wheezing throughout both lung.  ABDOMEN: Soft, flat, nontender, nondistended. Has good bowel sounds. No hepatosplenomegaly appreciated.  EXTREMITIES: No evidence of any cyanosis, clubbing, or positive peripheral edema.  +2 pedal and radial pulses bilaterally.  NEUROLOGIC: The patient is alert, awake, and oriented x3 with no focal motor or sensory deficits appreciated bilaterally.  SKIN: Moist and warm with no rashes appreciated.  Psych: Not anxious, depressed LN: No inguinal LN enlargement    Antibiotics   Anti-infectives (From admission, onward)   Start     Dose/Rate Route Frequency Ordered Stop   09/16/18 2200  ampicillin (OMNIPEN) 1 g in sodium chloride 0.9 % 100 mL IVPB  Status:  Discontinued     1 g 300 mL/hr over 20 Minutes Intravenous Every 8 hours 09/16/18 2017 09/16/18 2148   09/16/18 2145  Ampicillin-Sulbactam (UNASYN) 3 g in sodium chloride 0.9 % 100 mL IVPB     3 g 200 mL/hr over 30 Minutes Intravenous Every 6 hours 09/16/18 2141        Medications   Scheduled Meds: . amLODipine  10 mg Oral Daily  . aspirin EC  81 mg Oral Daily  . budesonide (PULMICORT) nebulizer solution  0.25 mg Nebulization BID  . docusate sodium  100 mg Oral BID  . enoxaparin (LOVENOX) injection  40 mg Subcutaneous Q24H  . fluticasone  2 spray Each Nare Daily  . furosemide  20 mg Intravenous Q12H  . hydrALAZINE  25 mg Oral Q8H  . ipratropium-albuterol  3 mL Nebulization TID  .  levothyroxine  100 mcg Oral QAC breakfast  . lisinopril  40 mg Oral Daily  . loratadine  10 mg Oral Q1400  . mouth rinse  15 mL Mouth Rinse BID  . Melatonin  5 mg Oral Q2000  . methylPREDNISolone (SOLU-MEDROL) injection  60 mg Intravenous Q6H  . polyethylene glycol  17 g Oral Daily  . pravastatin  40 mg Oral Q2000  . psyllium  1 packet Oral Daily  . sildenafil  60 mg Oral TID  . sodium chloride flush  10-40 mL Intracatheter Q12H  . venlafaxine XR  150 mg Oral Daily  . venlafaxine XR  75 mg Oral Daily   Continuous Infusions: . sodium chloride 500 mL (09/18/18 0949)  . ampicillin-sulbactam (UNASYN) IV 3 g (09/18/18 0950)   PRN Meds:.sodium chloride, acetaminophen **OR** acetaminophen, albuterol, ondansetron **OR** ondansetron (ZOFRAN) IV, sodium chloride flush, sodium chloride flush   Data Review:   Micro Results Recent Results (from the past 240 hour(s))  SARS Coronavirus 2 (CEPHEID - Performed in [place] hospital lab), Hosp Order     Status: None   Collection Time: 09/16/18  8:01 PM  Result Value Ref Range Status   SARS Coronavirus 2 NEGATIVE NEGATIVE Final    Comment: (NOTE) If result is NEGATIVE SARS-CoV-2 target nucleic acids are NOT DETECTED. The SARS-CoV-2 RNA is generally detectable in upper and lower  respiratory specimens during the acute phase of infection. The lowest  concentration of SARS-CoV-2 viral copies this assay can detect is 250  copies / mL. A negative result does not preclude SARS-CoV-2 infection  and should not be used as the sole basis for treatment or other  patient management decisions.  A negative result may occur with  improper specimen collection / handling, submission of specimen other  than nasopharyngeal swab, presence of viral mutation(s) within the  areas targeted by this assay, and inadequate number of viral copies  (<250 copies / mL). A negative result must be combined with clinical  observations, patient history, and epidemiological  information. If result is POSITIVE SARS-CoV-2 target  nucleic acids are DETECTED. The SARS-CoV-2 RNA is generally detectable in upper and lower  respiratory specimens dur ing the acute phase of infection.  Positive  results are indicative of active infection with SARS-CoV-2.  Clinical  correlation with patient history and other diagnostic information is  necessary to determine patient infection status.  Positive results do  not rule out bacterial infection or co-infection with other viruses. If result is PRESUMPTIVE POSTIVE SARS-CoV-2 nucleic acids MAY BE PRESENT.   A presumptive positive result was obtained on the submitted specimen  and confirmed on repeat testing.  While 2019 novel coronavirus  (SARS-CoV-2) nucleic acids may be present in the submitted sample  additional confirmatory testing may be necessary for epidemiological  and / or clinical management purposes  to differentiate between  SARS-CoV-2 and other Sarbecovirus currently known to infect humans.  If clinically indicated additional testing with an alternate test  methodology [phone]) is advised. The SARS-CoV-2 RNA is generally  detectable in upper and lower respiratory sp ecimens during the acute  phase of infection. The expected result is Negative. Fact Sheet for Patients:  StrictlyIdeas.no Fact Sheet for Healthcare Providers: BankingDealers.co.za This test is not yet approved or cleared by the [place] FDA and has been authorized for detection and/or diagnosis of SARS-CoV-2 by FDA under an Emergency Use Authorization (EUA).  This EUA will remain in effect (meaning this test can be used) for the duration of the COVID-19 declaration under Section 564(b)(1) of the Act, 21 U.S.C. section 360bbb-3(b)(1), unless the authorization is terminated or revoked sooner. Performed at [facility], [address], [place], [place] [address]     Radiology Reports Dg  Chest Portable 1 View  Result Date: 09/16/2018 CLINICAL DATA:  Worsening shortness of breath. EXAM: PORTABLE CHEST 1 VIEW COMPARISON:  06/10/2018 FINDINGS: 1719 hours. Asymmetric airspace disease, right greater than left suggest multifocal pneumonia although asymmetric edema could have this appearance. Probable tiny right pleural effusion. The cardio pericardial silhouette is enlarged. The visualized bony structures of the thorax are intact. Insert wire IMPRESSION: Right greater than left airspace disease compatible with diffuse infection or asymmetric edema. Tiny right effusion. Electronically Signed   By: Misty Stanley M.D.   On: 09/16/2018 18:06   Ct Maxillofacial Wo Contrast  Result Date: 09/17/2018 CLINICAL DATA:  Fever and facial pain EXAM: CT MAXILLOFACIAL WITHOUT CONTRAST TECHNIQUE: Multidetector CT imaging of the maxillofacial structures was performed. Multiplanar CT image reconstructions were also generated. COMPARISON:  None. FINDINGS: Osseous: No facial fracture. Dental: The patient is edentulous. Orbits: The globes are intact. Normal appearance of the intra- and extraconal fat. Symmetric extraocular muscles. Sinuses: No fluid levels or advanced mucosal thickening. Soft tissues: Normal visualized extracranial soft tissues. Limited intracranial: Normal. IMPRESSION: Clear sinuses.  No specific finding to explain facial pain. Electronically Signed   By: Ulyses Jarred M.D.   On: 09/17/2018 13:43     CBC Recent Labs  Lab 09/16/18 1717 09/17/18 0302 09/18/18 0654  WBC 7.9 10.9* 12.4*  HGB 11.0* 11.1* 11.0*  HCT 32.6* 32.9* 33.3*  PLT 256 242 255  MCV 88.8 87.7 89.3  MCH 30.0 29.6 29.5  MCHC 33.7 33.7 33.0  RDW 14.5 14.3 14.3  LYMPHSABS 1.4  --   --   MONOABS 0.5  --   --   EOSABS 0.2  --   --   BASOSABS 0.1  --   --     Chemistries  Recent Labs  Lab 09/16/18 1717 09/17/18 0302 09/18/18 0654  NA 127* 126* 131*  K 4.8 4.4 4.0  CL 95* 93* 98  CO2 22 20* 19*  GLUCOSE 136*  111* 142*  BUN 33* 29* 34*  CREATININE 1.10* 1.10* 1.30*  CALCIUM 9.3 9.1 9.1  AST 19  --   --   ALT 12  --   --   ALKPHOS 103  --   --   BILITOT 0.5  --   --    ------------------------------------------------------------------------------------------------------------------ estimated creatinine clearance is 34.6 mL/min (A) (by C-G formula based on SCr of 1.3 mg/dL (H)). ------------------------------------------------------------------------------------------------------------------ No results for input(s): HGBA1C in the last 72 hours. ------------------------------------------------------------------------------------------------------------------ No results for input(s): CHOL, HDL, LDLCALC, TRIG, CHOLHDL, LDLDIRECT in the last 72 hours. ------------------------------------------------------------------------------------------------------------------ No results for input(s): TSH, T4TOTAL, T3FREE, THYROIDAB in the last 72 hours.  Invalid input(s): FREET3 ------------------------------------------------------------------------------------------------------------------ No results for input(s): VITAMINB12, FOLATE, FERRITIN, TIBC, IRON, RETICCTPCT in the last 72 hours.  Coagulation profile No results for input(s): INR, PROTIME in the last 168 hours.  No results for input(s): DDIMER in the last 72 hours.  Cardiac Enzymes Recent Labs  Lab 09/16/18 1717  TROPONINI <0.03   ------------------------------------------------------------------------------------------------------------------ Invalid input(s): POCBNP    Assessment & Plan   Patient is 85 year old presenting with shortness of breath  1 acute on chronic respiratory failure I suspect this is multifactorial including due to acute CHF, acute COPD flare and possible pneumonia I will ask pulmonary to see due to no significant improvement  2 acute on chronic diastolic CHF patient given oral Lasix I will give patient IV  Lasix  3 acute on chronic COPD exasperation we will change her nebs to every 6, add Pulmicort continue Solu-Medrol  4.  Hyponatremia likely due to fluid overload we will follow sodium check improved with diuresis  5.    HTN (hypertension) -continue hydralazine lisinopril  6.  CAD (coronary artery disease) -continue home medications with aspirin  7.  Hypothyroidism continue Synthroid  8.  Hyperlipidemia continue Pravachol  9.  Severe pulmonary hypertension resume Revatio  10.  Miscellaneous heparin for DVT prophylaxis     Code Status Orders  (From admission, onward)         Start     Ordered   09/17/18 1057  Do not attempt resuscitation (DNR)  Continuous    Question Answer Comment  In the event of cardiac or respiratory ARREST Do not call a "code blue"   In the event of cardiac or respiratory ARREST Do not perform Intubation, CPR, defibrillation or ACLS   In the event of cardiac or respiratory ARREST Use medication by any route, position, wound care, and other measures to relive pain and suffering. May use oxygen, suction and manual treatment of airway obstruction as needed for comfort.      09/17/18 1056        Code Status History    Date Active Date Inactive Code Status Order ID Comments User Context   09/16/2018 2306 09/17/2018 1056 Full Code [id number]  Lance Coon, MD Inpatient   06/22/2018 1823 06/26/2018 1816 Full Code [id number]  Lance Coon, MD Inpatient   06/11/2018 0412 06/12/2018 1849 Full Code [id number]  Harrie Foreman, MD Inpatient   05/14/2017 1927 05/17/2017 1739 Full Code [id number]  Demetrios Loll, MD Inpatient   05/04/2017 1755 05/09/2017 1507 Full Code [id number]  Fritzi Mandes, MD Inpatient   06/21/2016 0959 06/21/2016 1413 Full Code [id number]  Isaias Cowman, MD Inpatient   02/13/2016 0417 02/15/2016 2021 Full Code [id number]  Hugelmeyer, Ubaldo Glassing, DO Inpatient  Consults cards  DVT Prophylaxis  Lovenox   Lab Results  Component Value Date    PLT 255 09/18/2018     Time Spent in minutes   45min  Greater than 50% of time spent in care coordination and counseling patient regarding the condition and plan of care.   Dustin Flock M.D on 09/18/2018 at 12:42 PM  Between 7am to 6pm - Pager - [phone]  After 6pm go to [email or web address] - Proofreader  [employer]   Office  [phone]

## 2018-09-19 LAB — FERRITIN: Ferritin: 221 ng/mL (ref 11–307)

## 2018-09-19 LAB — RESPIRATORY PANEL BY PCR

## 2018-09-19 MED ORDER — MELATONIN 5 MG PO TABS
5.0000 mg | ORAL_TABLET | Freq: Once | ORAL | Status: AC
  Administered 2018-09-19: 5 mg via ORAL
  Filled 2018-09-19: qty 1

## 2018-09-19 NOTE — Progress Notes (Signed)
Patient is on auto bipap with o2 for the night. Not getting cpt since she is going to bed and hopes to sleep all night with bipap

## 2018-09-19 NOTE — Progress Notes (Signed)
Patient previously declined CPT requesting to sleep throughout night and resume CPT treatment in morning.

## 2018-09-19 NOTE — Plan of Care (Signed)

## 2018-09-19 NOTE — Care Management Important Message (Signed)
Important Message  Patient Details  Name: Lisa Haney MRN: [id number] Date of Birth: February 28, 1927   Medicare Important Message Given:  Yes    Dannette Barbara 09/19/2018, 11:29 AM

## 2018-09-19 NOTE — Progress Notes (Signed)
Pharmacy Antibiotic Note  Lisa Haney is a 85 y.o. female admitted on 09/16/2018 with CAP.  Pharmacy has been consulted for Unasyn dosing.  Plan:   Day 4- Unasyn 3 gm IV Q6H ordered to start @ 2145 on 5/19.    Height: 5\' 6"  (167.6 cm) Weight: 215 lb 1.6 oz (97.6 kg) IBW/kg (Calculated) : 59.3  Temp (24hrs), Avg:97.4 F (36.3 C), Min:97.3 F (36.3 C), Max:97.6 F (36.4 C)  Recent Labs  Lab 09/16/18 1717 09/17/18 0302 09/18/18 0654  WBC 7.9 10.9* 12.4*  CREATININE 1.10* 1.10* 1.30*    Estimated Creatinine Clearance: 34.6 mL/min (A) (by C-G formula based on SCr of 1.3 mg/dL (H)).    Allergies  Allergen Reactions  . Doxycycline Other (See Comments)    Severe upset stomach and pain     Antimicrobials this admission:  Unasyn 5/19 >>    >>  Dose adjustments this admission:   Microbiology results:  BCx:  UCx:    Sputum:    MRSA PCR:   Thank you for allowing pharmacy to be a part of this patient's care.  Zackarey Holleman A 09/19/2018 3:01 PM

## 2018-09-19 NOTE — Evaluation (Signed)
Physical Therapy Evaluation Patient Details Name: Lisa Haney MRN: [id number] DOB: 11-29-1926 Today's Date: 09/19/2018   History of Present Illness  Merna Baldi is an 57yoF who comes to [facility] on 5/19 c progressive SOB admitted with A/C dCHF exacerbation, but now thought to have CAP and suspected COPD exacerbation as well, overall very limited prognosis per medical team. PMH: COPD, CVA, CAD, HTN, OSA on CPAP, CRF on 2L/min at home. Pt was admitted in Feb 2020 and DC to [facility] for [place] to return to home,. Pt had returned to full independence in ADL and household AMB c RW.   Clinical Impression  Pt admitted with above diagnosis. Pt currently with functional limitations due to the deficits listed below (see "PT Problem List"). Upon entry, pt in bed, awake and agreeable to participate. Pt reports he breathlessness is worse today than other days since admission. The pt is alert and oriented x3, pleasant, conversational, and generally a good historian, but tangential and requires cues to remain on topic. Pt reports that doctor has recommended hospice services and that she will elect to use them as she 'doesn't have much longer,' however Pryor Curia does not see this reflected in the medical record at time of eval. Pt moves to EOB and STS transfers with near maximal effort, but without physical assist. She at first desats to 78% on 3L/min with 2 minutes recovery on 5L/min, then down to 84% on 5L/min c 30sec recovery on 7L/min. Pt able to take a few small steps laterally toward the [facility], but it is difficulty and taxing, with noted intermittent buckling of the knees. Functional mobility assessment demonstrates increased effort/time requirements, poor tolerance, and need for physical assistance, whereas the patient performed these at a higher level of independence PTA. With current O2 requirements, patient would need assistance with ADL and a WC for mobility needs, unlikely to self-propel. Pt will benefit  from skilled PT intervention to increase independence and safety with basic mobility in preparation for discharge to the venue listed below.       Follow Up Recommendations Home health PT;Supervision for mobility/OOB;Supervision/Assistance - 24 hour    Equipment Recommendations  None recommended by PT    Recommendations for Other Services       Precautions / Restrictions Precautions Precautions: Fall Restrictions Weight Bearing Restrictions: No      Mobility  Bed Mobility Overal bed mobility: Modified Independent             General bed mobility comments: addional effort needed, desats to 78% on 3L (down from 89%); on 5L for recovery which requires 2 minutes.   Transfers Overall transfer level: Needs assistance Equipment used: Rolling walker (2 wheeled) Transfers: Sit to/from Stand Sit to Stand: Min guard         General transfer comment: 4 attempts and near maximal effort, on 5L/min.   Ambulation/Gait Ambulation/Gait assistance: Min guard Gait Distance (Feet): 1 Feet Assistive device: Rolling walker (2 wheeled)       General Gait Details: VC for left side stepping toward HOB; on 5L/min pt drops to 84%, turned up to 7L/min for recovery which takes 30seconds.   Stairs            Wheelchair Mobility    Modified Rankin (Stroke Patients Only)       Balance Overall balance assessment: History of Falls;Mild deficits observed, not formally tested  Pertinent Vitals/Pain Pain Assessment: No/denies pain    Home Living Family/patient expects to be discharged to:: Private residence Living Arrangements: Children(Son, and other 2 sons assists some as well) Available Help at Discharge: Family;Available PRN/intermittently Type of Home: House Home Access: Ramped entrance     Home Layout: One level Home Equipment: Walker - 2 wheels;Bedside commode;Toilet riser;Grab bars - tub/shower;Hand held  shower head;Wheelchair - power;Shower seat - built in      Prior Function Level of Independence: Needs assistance   Gait / Transfers Assistance Needed: Pt reports she was a household distance ambulator PTA with use of RW. She is no longer currently driving.  ADL's / Homemaking Assistance Needed: Pt reports PTA she was able to bathe and dress indepdently. Pt was able to perform light cooking and cleaning but required assistance from son.  Comments: Alone during the day when son is at work.      Hand Dominance   Dominant Hand: Right    Extremity/Trunk Assessment   Upper Extremity Assessment Upper Extremity Assessment: Generalized weakness    Lower Extremity Assessment Lower Extremity Assessment: Generalized weakness       Communication   Communication: HOH  Cognition Arousal/Alertness: (tangential ) Behavior During Therapy: WFL for tasks assessed/performed;Flat affect Overall Cognitive Status: Within Functional Limits for tasks assessed                                        General Comments      Exercises     Assessment/Plan    PT Assessment Patient needs continued PT services  PT Problem List Decreased activity tolerance;Decreased mobility;Decreased strength;Decreased balance       PT Treatment Interventions Therapeutic activities;Patient/family education;Therapeutic exercise;Balance training;Functional mobility training;Gait training    PT Goals (Current goals can be found in the Care Plan section)  Acute Rehab PT Goals Patient Stated Goal: have less breathlessness.  PT Goal Formulation: With patient Time For Goal Achievement: 10/03/18 Potential to Achieve Goals: Fair    Frequency Min 2X/week   Barriers to discharge Decreased caregiver support      Co-evaluation               AM-PAC PT "6 Clicks" Mobility  Outcome Measure Help needed turning from your back to your side while in a flat bed without using bedrails?: A Little Help  needed moving from lying on your back to sitting on the side of a flat bed without using bedrails?: A Little Help needed moving to and from a bed to a chair (including a wheelchair)?: A Little Help needed standing up from a chair using your arms (e.g., wheelchair or bedside chair)?: A Little Help needed to walk in hospital room?: A Lot Help needed climbing 3-5 steps with a railing? : Total 6 Click Score: 15    End of Session Equipment Utilized During Treatment: Oxygen Activity Tolerance: Treatment limited secondary to medical complications (Comment);Patient tolerated treatment well(easy O2 desat even on high levels) Patient left: in bed;with bed alarm set;with call bell/phone within reach Nurse Communication: Mobility status PT Visit Diagnosis: Muscle weakness (generalized) (M62.81);Difficulty in walking, not elsewhere classified (R26.2)    Time: 0254-2706 PT Time Calculation (min) (ACUTE ONLY): 22 min   Charges:   PT Evaluation $PT Eval High Complexity: 1 High PT Treatments $Therapeutic Exercise: 8-22 mins        5:16 PM, 09/19/18 Etta Grandchild, PT, DPT Physical  Therapist - [facility]  [phone] ([place])   [place] C 09/19/2018, 5:11 PM

## 2018-09-19 NOTE — Progress Notes (Signed)
Patient declined CPT throughout the night. Will resume CPT at 0800.

## 2018-09-19 NOTE — Progress Notes (Signed)
Pulmonary Medicine          Date: 09/19/2018,   MRN# [id number] Lisa Haney October 12, 1926     AdmissionWeight: 81.6 kg                 CurrentWeight: 97.6 kg        SUBJECTIVE   I have discussed care plan with family including son Lisa Haney.  Overall patient has poor prognosis, at this time would unlikely do well with additional medication for severe pulmonary hypertension as risks outweigh the benefit.  I had recommended comfort measures.  Family will have meeting amongst themselves today.  I have discussed comfort care with patient and she agrees that this is reasonable.  She will discuss options with family and had expressed interest in non-home hospice care due to family burden as they are dealing with their own medical issues and have to work and take care of family as well.  Goals of care conference with me tomorrow with family and patient.   PAST MEDICAL HISTORY   Past Medical History:  Diagnosis Date  . Arthritis   . Cancer ([place])    skin cancer on hand  . COPD (chronic obstructive pulmonary disease) ([place])   . Coronary artery disease    stent  . Diverticulitis   . Hypertension   . Stroke [facility])    TIA  . TIA (transient ischemic attack)      SURGICAL HISTORY   Past Surgical History:  Procedure Laterality Date  . ABDOMINAL HYSTERECTOMY    . CAROTID STENT    . CHOLECYSTECTOMY    . DILATION AND CURETTAGE OF UTERUS    . NASAL SINUS SURGERY    . OVARIAN CYST REMOVAL    . REPLACEMENT TOTAL KNEE Right   . TONSILLECTOMY       FAMILY HISTORY   No family history on file.   SOCIAL HISTORY   Social History   Tobacco Use  . Smoking status: Never Smoker  . Smokeless tobacco: Never Used  Substance Use Topics  . Alcohol use: No  . Drug use: No     MEDICATIONS    Home Medication:    Current Medication:  Current Facility-Administered Medications:  .  0.9 %  sodium chloride infusion, , Intravenous, PRN, Lance Coon, MD, Stopped at  09/18/18 1908 .  acetaminophen (TYLENOL) tablet 650 mg, 650 mg, Oral, Q6H PRN, 650 mg at 09/19/18 0239 **OR** acetaminophen (TYLENOL) suppository 650 mg, 650 mg, Rectal, Q6H PRN, Lance Coon, MD .  albuterol (PROVENTIL) (2.5 MG/3ML) 0.083% nebulizer solution 2.5 mg, 2.5 mg, Nebulization, Q2H PRN, Dustin Flock, MD, 2.5 mg at 09/19/18 1155 .  amLODipine (NORVASC) tablet 10 mg, 10 mg, Oral, Daily, Lance Coon, MD, 10 mg at 09/19/18 1135 .  Ampicillin-Sulbactam (UNASYN) 3 g in sodium chloride 0.9 % 100 mL IVPB, 3 g, Intravenous, Q6H, Lance Coon, MD, Last Rate: 200 mL/hr at 09/19/18 1520, 3 g at 09/19/18 1520 .  aspirin EC tablet 81 mg, 81 mg, Oral, Daily, Lance Coon, MD, 81 mg at 09/19/18 1134 .  budesonide (PULMICORT) nebulizer solution 0.25 mg, 0.25 mg, Nebulization, BID, Dustin Flock, MD, 0.25 mg at 09/19/18 0754 .  docusate sodium (COLACE) capsule 100 mg, 100 mg, Oral, BID, Seals, Angela H, NP, 100 mg at 09/19/18 1135 .  enoxaparin (LOVENOX) injection 40 mg, 40 mg, Subcutaneous, Q24H, Lance Coon, MD, 40 mg at 09/19/18 0025 .  fluticasone (FLONASE) 50 MCG/ACT nasal spray 2 spray, 2 spray, Each  Nare, Daily, Dustin Flock, MD, 2 spray at 09/19/18 1139 .  furosemide (LASIX) injection 40 mg, 40 mg, Intravenous, Q12H, Jordan Caraveo, MD, 40 mg at 09/19/18 0513 .  hydrALAZINE (APRESOLINE) tablet 25 mg, 25 mg, Oral, Q8H, Lance Coon, MD, 25 mg at 09/19/18 1518 .  ipratropium-albuterol (DUONEB) 0.5-2.5 (3) MG/3ML nebulizer solution 3 mL, 3 mL, Nebulization, TID, Dustin Flock, MD, 3 mL at 09/19/18 1415 .  levothyroxine (SYNTHROID) tablet 100 mcg, 100 mcg, Oral, QAC breakfast, Lance Coon, MD, 100 mcg at 09/19/18 0503 .  lisinopril (ZESTRIL) tablet 40 mg, 40 mg, Oral, Daily, Lance Coon, MD, 40 mg at 09/19/18 1133 .  loratadine (CLARITIN) tablet 10 mg, 10 mg, Oral, Q1400, Dustin Flock, MD, 10 mg at 09/19/18 1518 .  MEDLINE mouth rinse, 15 mL, Mouth Rinse, BID, Dustin Flock,  MD, 15 mL at 09/18/18 0930 .  Melatonin TABS 5 mg, 5 mg, Oral, Q2000, Lance Coon, MD, 5 mg at 09/18/18 2117 .  methylPREDNISolone sodium succinate (SOLU-MEDROL) 125 mg/2 mL injection 60 mg, 60 mg, Intravenous, Q6H, Lance Coon, MD, 60 mg at 09/19/18 1133 .  ondansetron (ZOFRAN) tablet 4 mg, 4 mg, Oral, Q6H PRN **OR** ondansetron (ZOFRAN) injection 4 mg, 4 mg, Intravenous, Q6H PRN, Lance Coon, MD .  polyethylene glycol (MIRALAX / GLYCOLAX) packet 17 g, 17 g, Oral, Daily, Seals, Angela H, NP, 17 g at 09/19/18 1139 .  pravastatin (PRAVACHOL) tablet 40 mg, 40 mg, Oral, Q2000, Lance Coon, MD, 40 mg at 09/18/18 2117 .  psyllium (HYDROCIL/METAMUCIL) packet 1 packet, 1 packet, Oral, Daily, Seals, Theo Dills, NP, 1 packet at 09/19/18 1137 .  sildenafil (REVATIO) tablet 60 mg, 60 mg, Oral, TID, Dustin Flock, MD, 60 mg at 09/19/18 1519 .  sodium chloride flush (NS) 0.9 % injection 10-40 mL, 10-40 mL, Intracatheter, PRN, Lance Coon, MD .  sodium chloride flush (NS) 0.9 % injection 10-40 mL, 10-40 mL, Intracatheter, Q12H, Dustin Flock, MD, 10 mL at 09/18/18 2127 .  sodium chloride flush (NS) 0.9 % injection 10-40 mL, 10-40 mL, Intracatheter, PRN, Dustin Flock, MD .  venlafaxine XR (EFFEXOR-XR) 24 hr capsule 150 mg, 150 mg, Oral, Daily, Lance Coon, MD, 150 mg at 09/19/18 1134 .  venlafaxine XR (EFFEXOR-XR) 24 hr capsule 75 mg, 75 mg, Oral, Daily, Lance Coon, MD, 75 mg at 09/19/18 1135    ALLERGIES   Doxycycline     REVIEW OF SYSTEMS    Review of Systems:  Gen:  Denies  fever, sweats, chills weigh loss  HEENT: Denies blurred vision, double vision, ear pain, eye pain, hearing loss, nose bleeds, sore throat Cardiac:  No dizziness, chest pain or heaviness, chest tightness,edema Resp:   Denies cough or sputum porduction, shortness of breath,wheezing, hemoptysis,  Gi: Denies swallowing difficulty, stomach pain, nausea or vomiting, diarrhea, constipation, bowel incontinence Gu:   Denies bladder incontinence, burning urine Ext:   Denies Joint pain, stiffness or swelling Skin: Denies  skin rash, easy bruising or bleeding or hives Endoc:  Denies polyuria, polydipsia , polyphagia or weight change Psych:   Denies depression, insomnia or hallucinations   Other:  All other systems negative   VS: BP (!) 143/63 (BP Location: Right Arm)   Pulse 98   Temp (!) 97.4 F (36.3 C) (Oral)   Resp 20   Ht 5\' 6"  (1.676 m)   Wt 97.6 kg   SpO2 92%   BMI 34.72 kg/m      PHYSICAL EXAM    GENERAL:NAD, no fevers, chills, no  weakness no fatigue HEAD: Normocephalic, atraumatic.  EYES: Pupils equal, round, reactive to light. Extraocular muscles intact. No scleral icterus.  MOUTH: Moist mucosal membrane. Dentition intact. No abscess noted.  EAR, NOSE, THROAT: Clear without exudates. No external lesions.  NECK: Supple. No thyromegaly. No nodules. No JVD.  PULMONARY: Decreased air entry bilaterally with mild rhonchorous breath sounds CARDIOVASCULAR: S1 and S2. Regular rate and rhythm. No murmurs, rubs, or gallops. No edema. Pedal pulses 2+ bilaterally.  GASTROINTESTINAL: Soft, nontender, nondistended. No masses. Positive bowel sounds. No hepatosplenomegaly.  MUSCULOSKELETAL: No swelling, clubbing, or edema. Range of motion full in all extremities.  NEUROLOGIC: Cranial nerves II through XII are intact. No gross focal neurological deficits. Sensation intact. Reflexes intact.  SKIN: No ulceration, lesions, rashes, or cyanosis. Skin warm and dry. Turgor intact.  PSYCHIATRIC: Mood, affect within normal limits. The patient is awake, alert and oriented x 3. Insight, judgment intact.       IMAGING    Dg Chest Portable 1 View  Result Date: 09/16/2018 CLINICAL DATA:  Worsening shortness of breath. EXAM: PORTABLE CHEST 1 VIEW COMPARISON:  06/10/2018 FINDINGS: 1719 hours. Asymmetric airspace disease, right greater than left suggest multifocal pneumonia although asymmetric edema could  have this appearance. Probable tiny right pleural effusion. The cardio pericardial silhouette is enlarged. The visualized bony structures of the thorax are intact. Insert wire IMPRESSION: Right greater than left airspace disease compatible with diffuse infection or asymmetric edema. Tiny right effusion. Electronically Signed   By: Misty Stanley M.D.   On: 09/16/2018 18:06   Ct Maxillofacial Wo Contrast  Result Date: 09/17/2018 CLINICAL DATA:  Fever and facial pain EXAM: CT MAXILLOFACIAL WITHOUT CONTRAST TECHNIQUE: Multidetector CT imaging of the maxillofacial structures was performed. Multiplanar CT image reconstructions were also generated. COMPARISON:  None. FINDINGS: Osseous: No facial fracture. Dental: The patient is edentulous. Orbits: The globes are intact. Normal appearance of the intra- and extraconal fat. Symmetric extraocular muscles. Sinuses: No fluid levels or advanced mucosal thickening. Soft tissues: Normal visualized extracranial soft tissues. Limited intracranial: Normal. IMPRESSION: Clear sinuses.  No specific finding to explain facial pain. Electronically Signed   By: Ulyses Jarred M.D.   On: 09/17/2018 13:43      ASSESSMENT/PLAN    Acute hypoxemic respiratory failure    - CT chest with bilateral diffuse airspace ground glass opacification    - initially with hypothermia T96 and tachypnea RR26 with accelerated htn    - possible viral LRTI related- COVID negative , will order RVP now    - Blood cultures neg to date     - Empirically on Unasyn IV 3g Q6H    - will order ferritin to evalute acute phase reaction    - BetaDglucan for eval for fungal etiology      Pulmonary arterial hyertension     -NYHA/WHO class 4 at this time    -Group 2-3 most likely     - Severe - Mean PAP >50, appears to be both pre and post capillary although no CO/CI found on RHC    - Currenlty on Revatio TID    - would not recommend aggressive therapy as pt is DNR and has high risk for  decompensation      - will incerase diuresis to 40 BID IV     - overall very poor prognosis -REVEAL registry score >16 =  less then 1 year survival     -Discussed palliative care and hospice/comfort care with family and patient today.  Family conference  with me tomorrow.   COPD    - unstaged - patient has not been followed by pulmonary in past    - discussed pulmonary clinic evaluation post d/c with patient - will set up appt   - currently on Solumedrol 60 q6h - will changed to bid    - continue Duoneb q6h prn and pulmicort    - chest physiotherapy and incentive spirometry                 Thank you for allowing me to participate in the care of this patient.    Patient/Family are satisfied with care plan and all questions have been answered.  This document was prepared using Dragon voice recognition software and may include unintentional dictation errors.     Ottie Glazier, M.D.  Division of [place]

## 2018-09-19 NOTE — Progress Notes (Signed)
[place] at [facility]                                                                                                                                                                                  Patient Demographics   Lisa Haney, is a 85 y.o. female, DOB - 11/26/1926, HMC:[id number]  Admit date - 09/16/2018   Admitting Physician Lance Coon, MD  Outpatient Primary MD for the patient is Kirk Ruths, MD   LOS - 3  Subjective: Continues to complain of congestion in her chest still has shortness of breath  Review of Systems:   CONSTITUTIONAL: No documented fever. No fatigue, weakness. No weight gain, no weight loss.  EYES: No blurry or double vision.  ENT: No tinnitus. No postnasal drip. No redness of the oropharynx.  RESPIRATORY: Positive cough, positive wheeze, no hemoptysis.  Positive dyspnea.  CARDIOVASCULAR: No chest pain. No orthopnea. No palpitations. No syncope.  GASTROINTESTINAL: No nausea, no vomiting or diarrhea. No abdominal pain. No melena or hematochezia.  GENITOURINARY: No dysuria or hematuria.  ENDOCRINE: No polyuria or nocturia. No heat or cold intolerance.  HEMATOLOGY: No anemia. No bruising. No bleeding.  INTEGUMENTARY: No rashes. No lesions.  MUSCULOSKELETAL: No arthritis. No swelling. No gout.  NEUROLOGIC: No numbness, tingling, or ataxia. No seizure-type activity.  PSYCHIATRIC: No anxiety. No insomnia. No ADD.    Vitals:   Vitals:   09/18/18 2207 09/19/18 0430 09/19/18 0742 09/19/18 0754  BP:  (!) 127/46 (!) 143/63   Pulse: 94 93 98   Resp: 20 16 20    Temp:  (!) 97.3 F (36.3 C) (!) 97.4 F (36.3 C)   TempSrc:  Oral Oral   SpO2: 95% 94% 93% 91%  Weight:  97.6 kg    Height:        Wt Readings from Last 3 Encounters:  09/19/18 97.6 kg  06/21/18 90.7 kg  06/12/18 96.6 kg     Intake/Output Summary (Last 24 hours) at 09/19/2018 1219 Last data filed at 09/19/2018 1021 Gross per 24 hour  Intake 896.61 ml   Output 750 ml  Net 146.61 ml    Physical Exam:   GENERAL: Pleasant-appearing in no apparent distress.  HEAD, EYES, EARS, NOSE AND THROAT: Atraumatic, normocephalic. Extraocular muscles are intact. Pupils equal and reactive to light. Sclerae anicteric. No conjunctival injection. No oro-pharyngeal erythema.  NECK: Supple. There is no jugular venous distention. No bruits, no lymphadenopathy, no thyromegaly.  HEART: Regular rate and rhythm,. No murmurs, no rubs, no clicks.  LUNGS: Bilateral wheezing throughout both lung.  ABDOMEN: Soft, flat, nontender, nondistended. Has good bowel sounds. No hepatosplenomegaly appreciated.  EXTREMITIES: No evidence of  any cyanosis, clubbing, or positive peripheral edema.  +2 pedal and radial pulses bilaterally.  NEUROLOGIC: The patient is alert, awake, and oriented x3 with no focal motor or sensory deficits appreciated bilaterally.  SKIN: Moist and warm with no rashes appreciated.  Psych: Not anxious, depressed LN: No inguinal LN enlargement    Antibiotics   Anti-infectives (From admission, onward)   Start     Dose/Rate Route Frequency Ordered Stop   09/16/18 2200  ampicillin (OMNIPEN) 1 g in sodium chloride 0.9 % 100 mL IVPB  Status:  Discontinued     1 g 300 mL/hr over 20 Minutes Intravenous Every 8 hours 09/16/18 2017 09/16/18 2148   09/16/18 2145  Ampicillin-Sulbactam (UNASYN) 3 g in sodium chloride 0.9 % 100 mL IVPB     3 g 200 mL/hr over 30 Minutes Intravenous Every 6 hours 09/16/18 2141        Medications   Scheduled Meds: . amLODipine  10 mg Oral Daily  . aspirin EC  81 mg Oral Daily  . budesonide (PULMICORT) nebulizer solution  0.25 mg Nebulization BID  . docusate sodium  100 mg Oral BID  . enoxaparin (LOVENOX) injection  40 mg Subcutaneous Q24H  . fluticasone  2 spray Each Nare Daily  . furosemide  40 mg Intravenous Q12H  . hydrALAZINE  25 mg Oral Q8H  . ipratropium-albuterol  3 mL Nebulization TID  . levothyroxine  100 mcg Oral  QAC breakfast  . lisinopril  40 mg Oral Daily  . loratadine  10 mg Oral Q1400  . mouth rinse  15 mL Mouth Rinse BID  . Melatonin  5 mg Oral Q2000  . methylPREDNISolone (SOLU-MEDROL) injection  60 mg Intravenous Q6H  . polyethylene glycol  17 g Oral Daily  . pravastatin  40 mg Oral Q2000  . psyllium  1 packet Oral Daily  . sildenafil  60 mg Oral TID  . sodium chloride flush  10-40 mL Intracatheter Q12H  . venlafaxine XR  150 mg Oral Daily  . venlafaxine XR  75 mg Oral Daily   Continuous Infusions: . sodium chloride Stopped (09/18/18 1908)  . ampicillin-sulbactam (UNASYN) IV 3 g (09/19/18 1136)   PRN Meds:.sodium chloride, acetaminophen **OR** acetaminophen, albuterol, ondansetron **OR** ondansetron (ZOFRAN) IV, sodium chloride flush, sodium chloride flush   Data Review:   Micro Results Recent Results (from the past 240 hour(s))  SARS Coronavirus 2 (CEPHEID - Performed in [place] hospital lab), Hosp Order     Status: None   Collection Time: 09/16/18  8:01 PM  Result Value Ref Range Status   SARS Coronavirus 2 NEGATIVE NEGATIVE Final    Comment: (NOTE) If result is NEGATIVE SARS-CoV-2 target nucleic acids are NOT DETECTED. The SARS-CoV-2 RNA is generally detectable in upper and lower  respiratory specimens during the acute phase of infection. The lowest  concentration of SARS-CoV-2 viral copies this assay can detect is 250  copies / mL. A negative result does not preclude SARS-CoV-2 infection  and should not be used as the sole basis for treatment or other  patient management decisions.  A negative result may occur with  improper specimen collection / handling, submission of specimen other  than nasopharyngeal swab, presence of viral mutation(s) within the  areas targeted by this assay, and inadequate number of viral copies  (<250 copies / mL). A negative result must be combined with clinical  observations, patient history, and epidemiological information. If result is  POSITIVE SARS-CoV-2 target nucleic acids are DETECTED. The  SARS-CoV-2 RNA is generally detectable in upper and lower  respiratory specimens dur ing the acute phase of infection.  Positive  results are indicative of active infection with SARS-CoV-2.  Clinical  correlation with patient history and other diagnostic information is  necessary to determine patient infection status.  Positive results do  not rule out bacterial infection or co-infection with other viruses. If result is PRESUMPTIVE POSTIVE SARS-CoV-2 nucleic acids MAY BE PRESENT.   A presumptive positive result was obtained on the submitted specimen  and confirmed on repeat testing.  While 2019 novel coronavirus  (SARS-CoV-2) nucleic acids may be present in the submitted sample  additional confirmatory testing may be necessary for epidemiological  and / or clinical management purposes  to differentiate between  SARS-CoV-2 and other Sarbecovirus currently known to infect humans.  If clinically indicated additional testing with an alternate test  methodology [phone]) is advised. The SARS-CoV-2 RNA is generally  detectable in upper and lower respiratory sp ecimens during the acute  phase of infection. The expected result is Negative. Fact Sheet for Patients:  StrictlyIdeas.no Fact Sheet for Healthcare Providers: BankingDealers.co.za This test is not yet approved or cleared by the [place] FDA and has been authorized for detection and/or diagnosis of SARS-CoV-2 by FDA under an Emergency Use Authorization (EUA).  This EUA will remain in effect (meaning this test can be used) for the duration of the COVID-19 declaration under Section 564(b)(1) of the Act, 21 U.S.C. section 360bbb-3(b)(1), unless the authorization is terminated or revoked sooner. Performed at [facility], [address] [place], [place] [address]   Respiratory Panel by PCR     Status: None    Collection Time: 09/18/18  9:25 PM  Result Value Ref Range Status   Adenovirus NOT DETECTED NOT DETECTED Final   Coronavirus 229E NOT DETECTED NOT DETECTED Final    Comment: (NOTE) The Coronavirus on the Respiratory Panel, DOES NOT test for the novel  Coronavirus (2019 nCoV)    Coronavirus HKU1 NOT DETECTED NOT DETECTED Final   Coronavirus NL63 NOT DETECTED NOT DETECTED Final   Coronavirus OC43 NOT DETECTED NOT DETECTED Final   Metapneumovirus NOT DETECTED NOT DETECTED Final   Rhinovirus / Enterovirus NOT DETECTED NOT DETECTED Final   Influenza A NOT DETECTED NOT DETECTED Final   Influenza B NOT DETECTED NOT DETECTED Final   Parainfluenza Virus 1 NOT DETECTED NOT DETECTED Final   Parainfluenza Virus 2 NOT DETECTED NOT DETECTED Final   Parainfluenza Virus 3 NOT DETECTED NOT DETECTED Final   Parainfluenza Virus 4 NOT DETECTED NOT DETECTED Final   Respiratory Syncytial Virus NOT DETECTED NOT DETECTED Final   Bordetella pertussis NOT DETECTED NOT DETECTED Final   Chlamydophila pneumoniae NOT DETECTED NOT DETECTED Final   Mycoplasma pneumoniae NOT DETECTED NOT DETECTED Final    Comment: Performed at [facility], [place]. [address], [place], [place] [address]    Radiology Reports Dg Chest Portable 1 View  Result Date: 09/16/2018 CLINICAL DATA:  Worsening shortness of breath. EXAM: PORTABLE CHEST 1 VIEW COMPARISON:  06/10/2018 FINDINGS: 1719 hours. Asymmetric airspace disease, right greater than left suggest multifocal pneumonia although asymmetric edema could have this appearance. Probable tiny right pleural effusion. The cardio pericardial silhouette is enlarged. The visualized bony structures of the thorax are intact. Insert wire IMPRESSION: Right greater than left airspace disease compatible with diffuse infection or asymmetric edema. Tiny right effusion. Electronically Signed   By: Misty Stanley M.D.   On: 09/16/2018 18:06  Ct Maxillofacial Wo Contrast  Result Date:  09/17/2018 CLINICAL DATA:  Fever and facial pain EXAM: CT MAXILLOFACIAL WITHOUT CONTRAST TECHNIQUE: Multidetector CT imaging of the maxillofacial structures was performed. Multiplanar CT image reconstructions were also generated. COMPARISON:  None. FINDINGS: Osseous: No facial fracture. Dental: The patient is edentulous. Orbits: The globes are intact. Normal appearance of the intra- and extraconal fat. Symmetric extraocular muscles. Sinuses: No fluid levels or advanced mucosal thickening. Soft tissues: Normal visualized extracranial soft tissues. Limited intracranial: Normal. IMPRESSION: Clear sinuses.  No specific finding to explain facial pain. Electronically Signed   By: Ulyses Jarred M.D.   On: 09/17/2018 13:43     CBC Recent Labs  Lab 09/16/18 1717 09/17/18 0302 09/18/18 0654  WBC 7.9 10.9* 12.4*  HGB 11.0* 11.1* 11.0*  HCT 32.6* 32.9* 33.3*  PLT 256 242 255  MCV 88.8 87.7 89.3  MCH 30.0 29.6 29.5  MCHC 33.7 33.7 33.0  RDW 14.5 14.3 14.3  LYMPHSABS 1.4  --   --   MONOABS 0.5  --   --   EOSABS 0.2  --   --   BASOSABS 0.1  --   --     Chemistries  Recent Labs  Lab 09/16/18 1717 09/17/18 0302 09/18/18 0654  NA 127* 126* 131*  K 4.8 4.4 4.0  CL 95* 93* 98  CO2 22 20* 19*  GLUCOSE 136* 111* 142*  BUN 33* 29* 34*  CREATININE 1.10* 1.10* 1.30*  CALCIUM 9.3 9.1 9.1  AST 19  --   --   ALT 12  --   --   ALKPHOS 103  --   --   BILITOT 0.5  --   --    ------------------------------------------------------------------------------------------------------------------ estimated creatinine clearance is 34.6 mL/min (A) (by C-G formula based on SCr of 1.3 mg/dL (H)). ------------------------------------------------------------------------------------------------------------------ No results for input(s): HGBA1C in the last 72 hours. ------------------------------------------------------------------------------------------------------------------ No results for input(s): CHOL, HDL,  LDLCALC, TRIG, CHOLHDL, LDLDIRECT in the last 72 hours. ------------------------------------------------------------------------------------------------------------------ No results for input(s): TSH, T4TOTAL, T3FREE, THYROIDAB in the last 72 hours.  Invalid input(s): FREET3 ------------------------------------------------------------------------------------------------------------------ Recent Labs    09/19/18 0542  FERRITIN 221    Coagulation profile No results for input(s): INR, PROTIME in the last 168 hours.  No results for input(s): DDIMER in the last 72 hours.  Cardiac Enzymes Recent Labs  Lab 09/16/18 1717  TROPONINI <0.03   ------------------------------------------------------------------------------------------------------------------ Invalid input(s): POCBNP    Assessment & Plan   Patient is 85 year old presenting with shortness of breath  1 acute on chronic respiratory failure I suspect this is multifactorial including due to acute CHF, acute COPD flare and possible pneumonia Appreciate pulmonary input prognosis very poor in this patient  2 acute on chronic diastolic CHF continue IV Lasix  3 acute on chronic COPD exasperation continue nebs to every 6, continue Pulmicort continue Solu-Medrol  4.  Hyponatremia likely due to fluid overload sodium improving  5.    HTN (hypertension) -continue hydralazine lisinopril  6.  CAD (coronary artery disease) -continue home medications with aspirin  7.  Hypothyroidism continue Synthroid  8.  Hyperlipidemia continue Pravachol  9.  Severe pulmonary hypertension resume Revatio  10.  Miscellaneous heparin for DVT prophylaxis     Code Status Orders  (From admission, onward)         Start     Ordered   09/17/18 1057  Do not attempt resuscitation (DNR)  Continuous    Question Answer Comment  In the event of cardiac or respiratory  ARREST Do not call a "code blue"   In the event of cardiac or respiratory ARREST Do  not perform Intubation, CPR, defibrillation or ACLS   In the event of cardiac or respiratory ARREST Use medication by any route, position, wound care, and other measures to relive pain and suffering. May use oxygen, suction and manual treatment of airway obstruction as needed for comfort.      09/17/18 1056        Code Status History    Date Active Date Inactive Code Status Order ID Comments User Context   09/16/2018 2306 09/17/2018 1056 Full Code [id number]  Lance Coon, MD Inpatient   06/22/2018 1823 06/26/2018 1816 Full Code [id number]  Lance Coon, MD Inpatient   06/11/2018 0412 06/12/2018 1849 Full Code [id number]  Harrie Foreman, MD Inpatient   05/14/2017 1927 05/17/2017 1739 Full Code [id number]  Demetrios Loll, MD Inpatient   05/04/2017 1755 05/09/2017 1507 Full Code [id number]  Fritzi Mandes, MD Inpatient   06/21/2016 0959 06/21/2016 1413 Full Code [id number]  Isaias Cowman, MD Inpatient   02/13/2016 0417 02/15/2016 2021 Full Code [id number]  Hugelmeyer, Ubaldo Glassing, DO Inpatient           Consults cards  DVT Prophylaxis  Lovenox   Lab Results  Component Value Date   PLT 255 09/18/2018     Time Spent in minutes   43min  Greater than 50% of time spent in care coordination and counseling patient regarding the condition and plan of care.   Dustin Flock M.D on 09/19/2018 at 12:19 PM  Between 7am to 6pm - Pager - [phone]  After 6pm go to [email or web address] - Proofreader  [employer]   Office  [phone]

## 2018-09-20 MED ORDER — ALPRAZOLAM 0.25 MG PO TABS
0.2500 mg | ORAL_TABLET | Freq: Two times a day (BID) | ORAL | Status: DC
  Administered 2018-09-20: 0.25 mg via ORAL
  Filled 2018-09-20: qty 1

## 2018-09-20 MED ORDER — LORAZEPAM 2 MG/ML PO CONC: 1.0000 mg | ORAL | Status: DC | PRN

## 2018-09-20 MED ORDER — MORPHINE SULFATE (CONCENTRATE) 10 MG/0.5ML PO SOLN: 5.0000 mg | ORAL | Status: DC | PRN

## 2018-09-20 MED ORDER — ACETYLCYSTEINE 20 % IN SOLN
4.0000 mL | Freq: Two times a day (BID) | RESPIRATORY_TRACT | Status: DC
  Filled 2018-09-20 (×2): qty 4

## 2018-09-20 MED ORDER — LORAZEPAM 1 MG PO TABS: 1.0000 mg | ORAL_TABLET | ORAL | Status: DC | PRN

## 2018-09-20 MED ORDER — POLYETHYLENE GLYCOL 3350 17 G PO PACK: 17.0000 g | PACK | Freq: Every day | ORAL | Status: DC | PRN

## 2018-09-20 MED ORDER — LORAZEPAM 2 MG/ML IJ SOLN: 1.0000 mg | INTRAMUSCULAR | Status: DC | PRN

## 2018-09-20 MED ORDER — BISACODYL 5 MG PO TBEC
5.0000 mg | DELAYED_RELEASE_TABLET | Freq: Every day | ORAL | Status: DC | PRN
  Administered 2018-09-20: 5 mg via ORAL
  Filled 2018-09-20: qty 1

## 2018-09-20 MED ORDER — GUAIFENESIN ER 600 MG PO TB12
600.0000 mg | ORAL_TABLET | Freq: Two times a day (BID) | ORAL | Status: DC
  Administered 2018-09-20 – 2018-09-23 (×6): 600 mg via ORAL
  Filled 2018-09-20 (×6): qty 1

## 2018-09-20 MED ORDER — MORPHINE SULFATE (PF) 2 MG/ML IV SOLN: 1.0000 mg | INTRAVENOUS | Status: DC | PRN

## 2018-09-20 NOTE — Progress Notes (Signed)
Pulmonary Medicine          Date: 09/20/2018,   MRN# [id number] Lisa Haney May 11, 1926     AdmissionWeight: 81.6 kg                 CurrentWeight: 98 kg        SUBJECTIVE    Had follow up phone conference from yesterday regarding goals of care - at patients bedside with sons [name] and Belenda Cruise on phone.  Patient explains that after discussion with sons yesterday and again today they wish to proceed with comfort measures in hospital and family will come in to visit mother.    PAST MEDICAL HISTORY   Past Medical History:  Diagnosis Date  . Arthritis   . Cancer ([place])    skin cancer on hand  . COPD (chronic obstructive pulmonary disease) ([place])   . Coronary artery disease    stent  . Diverticulitis   . Hypertension   . Stroke [facility])    TIA  . TIA (transient ischemic attack)      SURGICAL HISTORY   Past Surgical History:  Procedure Laterality Date  . ABDOMINAL HYSTERECTOMY    . CAROTID STENT    . CHOLECYSTECTOMY    . DILATION AND CURETTAGE OF UTERUS    . NASAL SINUS SURGERY    . OVARIAN CYST REMOVAL    . REPLACEMENT TOTAL KNEE Right   . TONSILLECTOMY       FAMILY HISTORY   No family history on file.   SOCIAL HISTORY   Social History   Tobacco Use  . Smoking status: Never Smoker  . Smokeless tobacco: Never Used  Substance Use Topics  . Alcohol use: No  . Drug use: No     MEDICATIONS    Home Medication:    Current Medication:  Current Facility-Administered Medications:  .  0.9 %  sodium chloride infusion, , Intravenous, PRN, Lance Coon, MD, Last Rate: 10 mL/hr at 09/20/18 0423, 30 mL at 09/20/18 0423 .  acetaminophen (TYLENOL) tablet 650 mg, 650 mg, Oral, Q6H PRN, 650 mg at 09/20/18 1142 **OR** acetaminophen (TYLENOL) suppository 650 mg, 650 mg, Rectal, Q6H PRN, Lance Coon, MD .  acetylcysteine (MUCOMYST) 20 % nebulizer / oral solution 4 mL, 4 mL, Oral, BID, Dustin Flock, MD .  albuterol (PROVENTIL) (2.5 MG/3ML)  0.083% nebulizer solution 2.5 mg, 2.5 mg, Nebulization, Q2H PRN, Dustin Flock, MD, 2.5 mg at 09/20/18 0640 .  ALPRAZolam Duanne Moron) tablet 0.25 mg, 0.25 mg, Oral, BID, Dustin Flock, MD, 0.25 mg at 09/20/18 1112 .  amLODipine (NORVASC) tablet 10 mg, 10 mg, Oral, Daily, Lance Coon, MD, 10 mg at 09/20/18 0849 .  Ampicillin-Sulbactam (UNASYN) 3 g in sodium chloride 0.9 % 100 mL IVPB, 3 g, Intravenous, Q6H, Lance Coon, MD, Last Rate: 200 mL/hr at 09/20/18 0852, 3 g at 09/20/18 0852 .  aspirin EC tablet 81 mg, 81 mg, Oral, Daily, Lance Coon, MD, 81 mg at 09/20/18 0845 .  bisacodyl (DULCOLAX) EC tablet 5 mg, 5 mg, Oral, Daily PRN, Seals, Angela H, NP, 5 mg at 09/20/18 0640 .  budesonide (PULMICORT) nebulizer solution 0.25 mg, 0.25 mg, Nebulization, BID, Dustin Flock, MD, 0.25 mg at 09/20/18 0745 .  docusate sodium (COLACE) capsule 100 mg, 100 mg, Oral, BID, Seals, Angela H, NP, 100 mg at 09/20/18 0847 .  enoxaparin (LOVENOX) injection 40 mg, 40 mg, Subcutaneous, Q24H, Lance Coon, MD, 40 mg at 09/19/18 2246 .  fluticasone (FLONASE) 50 MCG/ACT nasal  spray 2 spray, 2 spray, Each Nare, Daily, Dustin Flock, MD, 2 spray at 09/19/18 1139 .  furosemide (LASIX) injection 40 mg, 40 mg, Intravenous, Q12H, Ottie Glazier, MD, 40 mg at 09/20/18 0547 .  guaiFENesin (MUCINEX) 12 hr tablet 600 mg, 600 mg, Oral, BID, Dustin Flock, MD, 600 mg at 09/20/18 1119 .  hydrALAZINE (APRESOLINE) tablet 25 mg, 25 mg, Oral, Q8H, Lance Coon, MD, 25 mg at 09/20/18 0547 .  ipratropium-albuterol (DUONEB) 0.5-2.5 (3) MG/3ML nebulizer solution 3 mL, 3 mL, Nebulization, TID, Dustin Flock, MD, 3 mL at 09/20/18 0745 .  levothyroxine (SYNTHROID) tablet 100 mcg, 100 mcg, Oral, QAC breakfast, Lance Coon, MD, 100 mcg at 09/20/18 0548 .  lisinopril (ZESTRIL) tablet 40 mg, 40 mg, Oral, Daily, Lance Coon, MD, 40 mg at 09/20/18 0845 .  loratadine (CLARITIN) tablet 10 mg, 10 mg, Oral, Q1400, Dustin Flock, MD, 10  mg at 09/19/18 1518 .  MEDLINE mouth rinse, 15 mL, Mouth Rinse, BID, Dustin Flock, MD, 15 mL at 09/20/18 0857 .  Melatonin TABS 5 mg, 5 mg, Oral, Q2000, Lance Coon, MD, 5 mg at 09/19/18 2246 .  methylPREDNISolone sodium succinate (SOLU-MEDROL) 125 mg/2 mL injection 60 mg, 60 mg, Intravenous, Q6H, Lance Coon, MD, 60 mg at 09/20/18 1112 .  ondansetron (ZOFRAN) tablet 4 mg, 4 mg, Oral, Q6H PRN **OR** ondansetron (ZOFRAN) injection 4 mg, 4 mg, Intravenous, Q6H PRN, Lance Coon, MD .  polyethylene glycol (MIRALAX / GLYCOLAX) packet 17 g, 17 g, Oral, Daily, Seals, Angela H, NP, 17 g at 09/20/18 0845 .  polyethylene glycol (MIRALAX / GLYCOLAX) packet 17 g, 17 g, Oral, Daily PRN, Seals, Angela H, NP .  pravastatin (PRAVACHOL) tablet 40 mg, 40 mg, Oral, Q2000, Lance Coon, MD, 40 mg at 09/19/18 2258 .  psyllium (HYDROCIL/METAMUCIL) packet 1 packet, 1 packet, Oral, Daily, Seals, Theo Dills, NP, 1 packet at 09/20/18 [id number] .  sildenafil (REVATIO) tablet 60 mg, 60 mg, Oral, TID, Dustin Flock, MD, 60 mg at 09/20/18 0846 .  sodium chloride flush (NS) 0.9 % injection 10-40 mL, 10-40 mL, Intracatheter, PRN, Lance Coon, MD .  sodium chloride flush (NS) 0.9 % injection 10-40 mL, 10-40 mL, Intracatheter, Q12H, Dustin Flock, MD, 10 mL at 09/20/18 0857 .  sodium chloride flush (NS) 0.9 % injection 10-40 mL, 10-40 mL, Intracatheter, PRN, Dustin Flock, MD .  venlafaxine XR (EFFEXOR-XR) 24 hr capsule 150 mg, 150 mg, Oral, Daily, Lance Coon, MD, 150 mg at 09/20/18 0845 .  venlafaxine XR (EFFEXOR-XR) 24 hr capsule 75 mg, 75 mg, Oral, Daily, Lance Coon, MD, 75 mg at 09/20/18 0848    ALLERGIES   Doxycycline     REVIEW OF SYSTEMS    Review of Systems:  Gen:  Denies  fever, sweats, chills weigh loss  HEENT: Denies blurred vision, double vision, ear pain, eye pain, hearing loss, nose bleeds, sore throat Cardiac:  Chest heaviness/discomfort/SOB Resp:   Severe SOB with BIPAP  Gi: Denies  swallowing difficulty, stomach pain, nausea or vomiting, diarrhea, constipation, bowel incontinence Gu:  Denies bladder incontinence, burning urine Ext:   Denies Joint pain, stiffness or swelling Skin: Denies  skin rash, easy bruising or bleeding or hives Endoc:  Denies polyuria, polydipsia , polyphagia or weight change Psych:   Denies depression, insomnia or hallucinations   Other:  All other systems negative   VS: BP (!) 139/55 (BP Location: Right Arm)   Pulse 99   Temp 97.8 F (36.6 C) (Oral)   Resp 18  Ht 5\' 6"  (1.676 m)   Wt 98 kg   SpO2 93%   BMI 34.88 kg/m      PHYSICAL EXAM    GENERAL:NAD, no fevers, chills, no weakness no fatigue HEAD: Normocephalic, atraumatic.  EYES: Pupils equal, round, reactive to light. Extraocular muscles intact. No scleral icterus.  MOUTH: Moist mucosal membrane. Dentition intact. No abscess noted.  EAR, NOSE, THROAT: Clear without exudates. No external lesions.  NECK: Supple. No thyromegaly. No nodules. No JVD.  PULMONARY:decreased bs bilaterlly CARDIOVASCULAR: S1 and S2. Regular rate and rhythm. No murmurs, rubs, or gallops. No edema. Pedal pulses 2+ bilaterally.  GASTROINTESTINAL: Soft, nontender, nondistended. No masses. Positive bowel sounds. No hepatosplenomegaly.  MUSCULOSKELETAL: No swelling, clubbing, or edema. Range of motion full in all extremities.  NEUROLOGIC: Cranial nerves II through XII are intact. No gross focal neurological deficits. Sensation intact. Reflexes intact.  SKIN: No ulceration, lesions, rashes, or cyanosis. Skin warm and dry. Turgor intact.  PSYCHIATRIC: Mood, affect within normal limits. The patient is awake, alert and oriented x 3. Insight, judgment intact.       IMAGING    Dg Chest Portable 1 View  Result Date: 09/16/2018 CLINICAL DATA:  Worsening shortness of breath. EXAM: PORTABLE CHEST 1 VIEW COMPARISON:  06/10/2018 FINDINGS: 1719 hours. Asymmetric airspace disease, right greater than left suggest  multifocal pneumonia although asymmetric edema could have this appearance. Probable tiny right pleural effusion. The cardio pericardial silhouette is enlarged. The visualized bony structures of the thorax are intact. Insert wire IMPRESSION: Right greater than left airspace disease compatible with diffuse infection or asymmetric edema. Tiny right effusion. Electronically Signed   By: Misty Stanley M.D.   On: 09/16/2018 18:06   Ct Maxillofacial Wo Contrast  Result Date: 09/17/2018 CLINICAL DATA:  Fever and facial pain EXAM: CT MAXILLOFACIAL WITHOUT CONTRAST TECHNIQUE: Multidetector CT imaging of the maxillofacial structures was performed. Multiplanar CT image reconstructions were also generated. COMPARISON:  None. FINDINGS: Osseous: No facial fracture. Dental: The patient is edentulous. Orbits: The globes are intact. Normal appearance of the intra- and extraconal fat. Symmetric extraocular muscles. Sinuses: No fluid levels or advanced mucosal thickening. Soft tissues: Normal visualized extracranial soft tissues. Limited intracranial: Normal. IMPRESSION: Clear sinuses.  No specific finding to explain facial pain. Electronically Signed   By: Ulyses Jarred M.D.   On: 09/17/2018 13:43      ASSESSMENT/PLAN   Acute hypoxemic respiratory failure - CT chest with bilateral diffuse airspace ground glass opacification - initially with hypothermia T96 and tachypnea RR26 with accelerated htn - possible viral LRTI related- COVID negative , will order RVP now - Blood cultures neg to date  - Empirically on Unasyn IV 3g Q6H - will order ferritin to evalute acute phase reaction - BetaDglucan for eval for fungal etiology      Pulmonary arterial hyertension  -NYHA/WHO class 4 at this time -Group 2-3 most likely  - Severe - Mean PAP >50, appears to be both pre and post capillary although no CO/CI found on RHC - Currenlty on Revatio TID - would not recommend aggressive  therapy as pt is DNR and has high risk for decompensation  - will incerase diuresis to 40 BID IV  - overall very poor prognosis -REVEAL registry score >16 = less then 1 year survival    -Patient and family discussed her overall condition, patient reports having low quality of life and is tired of being sick "I will not make it through the night". Sons state they know she  is doing poorly and may pass any moment and agree that comfort measures are appropriate and wish to proceed with comfort care. Will place order to change code status today.  Family may come to visit patient and meet with chaplain/clregy.    COPD  - unstaged - patient has not been followed by pulmonary in past  - discussed pulmonary clinic evaluation post d/c with patient - will set up appt - currently on Solumedrol 60 q6h - will changed to bid  - continue Duoneb q6h prn and pulmicort  - chest physiotherapy and incentive spirometry        Thank you for allowing me to participate in the care of this patient.   Patient/Family are satisfied with care plan and all questions have been answered.  This document was prepared using Dragon voice recognition software and may include unintentional dictation errors.     Ottie Glazier, M.D.  Division of [facility]

## 2018-09-20 NOTE — Progress Notes (Signed)
   09/20/18 1600  Clinical Encounter Type  Visited With Patient and family together  Visit Type Initial;Spiritual support  Referral From Nurse  Spiritual Encounters  Spiritual Needs Prayer;Emotional;Grief support  Stress Factors  Patient Stress Factors Health changes;Loss;Loss of control  Family Stress Factors Exhausted

## 2018-09-20 NOTE — Progress Notes (Signed)
Patients family members are upset that patient has been taken off of all her home medications, specifically the sildenafil. They understand she is on comfort care but think some of her medications still need to be given.   Primary hospitialist is off call for the night.  I told son Lisa Haney) to call back and speak to doctor in the morning.

## 2018-09-20 NOTE — Progress Notes (Signed)
[place] at [facility]                                                                                                                                                                                  Patient Demographics   Lisa Haney, is a 85 y.o. female, DOB - 03/08/1927, OXB:[id number]  Admit date - 09/16/2018   Admitting Physician Lance Coon, MD  Outpatient Primary MD for the patient is Kirk Ruths, MD   LOS - 4  Subjective: Patient states that he feels like things are congested and unable to cough.  Review of Systems:   CONSTITUTIONAL: No documented fever. No fatigue, weakness. No weight gain, no weight loss.  EYES: No blurry or double vision.  ENT: No tinnitus. No postnasal drip. No redness of the oropharynx.  RESPIRATORY: Positive cough, positive wheeze, no hemoptysis.  Positive dyspnea.  CARDIOVASCULAR: No chest pain. No orthopnea. No palpitations. No syncope.  GASTROINTESTINAL: No nausea, no vomiting or diarrhea. No abdominal pain. No melena or hematochezia.  GENITOURINARY: No dysuria or hematuria.  ENDOCRINE: No polyuria or nocturia. No heat or cold intolerance.  HEMATOLOGY: No anemia. No bruising. No bleeding.  INTEGUMENTARY: No rashes. No lesions.  MUSCULOSKELETAL: No arthritis. No swelling. No gout.  NEUROLOGIC: No numbness, tingling, or ataxia. No seizure-type activity.  PSYCHIATRIC: No anxiety. No insomnia. No ADD.    Vitals:   Vitals:   09/19/18 2326 09/20/18 0534 09/20/18 0739 09/20/18 0740  BP:  (!) 154/58 (!) 139/55   Pulse:  (!) 109 100 99  Resp:  20 18   Temp:  (!) 97.5 F (36.4 C) 97.8 F (36.6 C)   TempSrc:  Oral Oral   SpO2: 94% 91% 92% 93%  Weight:  98 kg    Height:        Wt Readings from Last 3 Encounters:  09/20/18 98 kg  06/21/18 90.7 kg  06/12/18 96.6 kg     Intake/Output Summary (Last 24 hours) at 09/20/2018 1131 Last data filed at 09/20/2018 1002 Gross per 24 hour  Intake 840 ml  Output  750 ml  Net 90 ml    Physical Exam:   GENERAL: Pleasant-appearing in no apparent distress.  HEAD, EYES, EARS, NOSE AND THROAT: Atraumatic, normocephalic. Extraocular muscles are intact. Pupils equal and reactive to light. Sclerae anicteric. No conjunctival injection. No oro-pharyngeal erythema.  NECK: Supple. There is no jugular venous distention. No bruits, no lymphadenopathy, no thyromegaly.  HEART: Regular rate and rhythm,. No murmurs, no rubs, no clicks.  LUNGS: Rhonchus breath sounds bilaterally ABDOMEN: Soft, flat, nontender, nondistended. Has good bowel sounds. No hepatosplenomegaly appreciated.  EXTREMITIES: No evidence of any cyanosis,  clubbing, or positive peripheral edema.  +2 pedal and radial pulses bilaterally.  NEUROLOGIC: The patient is alert, awake, and oriented x3 with no focal motor or sensory deficits appreciated bilaterally.  SKIN: Moist and warm with no rashes appreciated.  Psych: Not anxious, depressed LN: No inguinal LN enlargement    Antibiotics   Anti-infectives (From admission, onward)   Start     Dose/Rate Route Frequency Ordered Stop   09/16/18 2200  ampicillin (OMNIPEN) 1 g in sodium chloride 0.9 % 100 mL IVPB  Status:  Discontinued     1 g 300 mL/hr over 20 Minutes Intravenous Every 8 hours 09/16/18 2017 09/16/18 2148   09/16/18 2145  Ampicillin-Sulbactam (UNASYN) 3 g in sodium chloride 0.9 % 100 mL IVPB     3 g 200 mL/hr over 30 Minutes Intravenous Every 6 hours 09/16/18 2141        Medications   Scheduled Meds: . acetylcysteine  4 mL Oral BID  . ALPRAZolam  0.25 mg Oral BID  . amLODipine  10 mg Oral Daily  . aspirin EC  81 mg Oral Daily  . budesonide (PULMICORT) nebulizer solution  0.25 mg Nebulization BID  . docusate sodium  100 mg Oral BID  . enoxaparin (LOVENOX) injection  40 mg Subcutaneous Q24H  . fluticasone  2 spray Each Nare Daily  . furosemide  40 mg Intravenous Q12H  . guaiFENesin  600 mg Oral BID  . hydrALAZINE  25 mg Oral Q8H   . ipratropium-albuterol  3 mL Nebulization TID  . levothyroxine  100 mcg Oral QAC breakfast  . lisinopril  40 mg Oral Daily  . loratadine  10 mg Oral Q1400  . mouth rinse  15 mL Mouth Rinse BID  . Melatonin  5 mg Oral Q2000  . methylPREDNISolone (SOLU-MEDROL) injection  60 mg Intravenous Q6H  . polyethylene glycol  17 g Oral Daily  . pravastatin  40 mg Oral Q2000  . psyllium  1 packet Oral Daily  . sildenafil  60 mg Oral TID  . sodium chloride flush  10-40 mL Intracatheter Q12H  . venlafaxine XR  150 mg Oral Daily  . venlafaxine XR  75 mg Oral Daily   Continuous Infusions: . sodium chloride 30 mL (09/20/18 0423)  . ampicillin-sulbactam (UNASYN) IV 3 g (09/20/18 0852)   PRN Meds:.sodium chloride, acetaminophen **OR** acetaminophen, albuterol, bisacodyl, ondansetron **OR** ondansetron (ZOFRAN) IV, polyethylene glycol, sodium chloride flush, sodium chloride flush   Data Review:   Micro Results Recent Results (from the past 240 hour(s))  SARS Coronavirus 2 (CEPHEID - Performed in [place] hospital lab), Hosp Order     Status: None   Collection Time: 09/16/18  8:01 PM  Result Value Ref Range Status   SARS Coronavirus 2 NEGATIVE NEGATIVE Final    Comment: (NOTE) If result is NEGATIVE SARS-CoV-2 target nucleic acids are NOT DETECTED. The SARS-CoV-2 RNA is generally detectable in upper and lower  respiratory specimens during the acute phase of infection. The lowest  concentration of SARS-CoV-2 viral copies this assay can detect is 250  copies / mL. A negative result does not preclude SARS-CoV-2 infection  and should not be used as the sole basis for treatment or other  patient management decisions.  A negative result may occur with  improper specimen collection / handling, submission of specimen other  than nasopharyngeal swab, presence of viral mutation(s) within the  areas targeted by this assay, and inadequate number of viral copies  (<250 copies / mL). A  negative result  must be combined with clinical  observations, patient history, and epidemiological information. If result is POSITIVE SARS-CoV-2 target nucleic acids are DETECTED. The SARS-CoV-2 RNA is generally detectable in upper and lower  respiratory specimens dur ing the acute phase of infection.  Positive  results are indicative of active infection with SARS-CoV-2.  Clinical  correlation with patient history and other diagnostic information is  necessary to determine patient infection status.  Positive results do  not rule out bacterial infection or co-infection with other viruses. If result is PRESUMPTIVE POSTIVE SARS-CoV-2 nucleic acids MAY BE PRESENT.   A presumptive positive result was obtained on the submitted specimen  and confirmed on repeat testing.  While 2019 novel coronavirus  (SARS-CoV-2) nucleic acids may be present in the submitted sample  additional confirmatory testing may be necessary for epidemiological  and / or clinical management purposes  to differentiate between  SARS-CoV-2 and other Sarbecovirus currently known to infect humans.  If clinically indicated additional testing with an alternate test  methodology [phone]) is advised. The SARS-CoV-2 RNA is generally  detectable in upper and lower respiratory sp ecimens during the acute  phase of infection. The expected result is Negative. Fact Sheet for Patients:  StrictlyIdeas.no Fact Sheet for Healthcare Providers: BankingDealers.co.za This test is not yet approved or cleared by the [place] FDA and has been authorized for detection and/or diagnosis of SARS-CoV-2 by FDA under an Emergency Use Authorization (EUA).  This EUA will remain in effect (meaning this test can be used) for the duration of the COVID-19 declaration under Section 564(b)(1) of the Act, 21 U.S.C. section 360bbb-3(b)(1), unless the authorization is terminated or revoked sooner. Performed at [facility], [address] [place], [place] [address]   Respiratory Panel by PCR     Status: None   Collection Time: 09/18/18  9:25 PM  Result Value Ref Range Status   Adenovirus NOT DETECTED NOT DETECTED Final   Coronavirus 229E NOT DETECTED NOT DETECTED Final    Comment: (NOTE) The Coronavirus on the Respiratory Panel, DOES NOT test for the novel  Coronavirus (2019 nCoV)    Coronavirus HKU1 NOT DETECTED NOT DETECTED Final   Coronavirus NL63 NOT DETECTED NOT DETECTED Final   Coronavirus OC43 NOT DETECTED NOT DETECTED Final   Metapneumovirus NOT DETECTED NOT DETECTED Final   Rhinovirus / Enterovirus NOT DETECTED NOT DETECTED Final   Influenza A NOT DETECTED NOT DETECTED Final   Influenza B NOT DETECTED NOT DETECTED Final   Parainfluenza Virus 1 NOT DETECTED NOT DETECTED Final   Parainfluenza Virus 2 NOT DETECTED NOT DETECTED Final   Parainfluenza Virus 3 NOT DETECTED NOT DETECTED Final   Parainfluenza Virus 4 NOT DETECTED NOT DETECTED Final   Respiratory Syncytial Virus NOT DETECTED NOT DETECTED Final   Bordetella pertussis NOT DETECTED NOT DETECTED Final   Chlamydophila pneumoniae NOT DETECTED NOT DETECTED Final   Mycoplasma pneumoniae NOT DETECTED NOT DETECTED Final    Comment: Performed at [facility], [place]. [address], [place], [place] [address]    Radiology Reports Dg Chest Portable 1 View  Result Date: 09/16/2018 CLINICAL DATA:  Worsening shortness of breath. EXAM: PORTABLE CHEST 1 VIEW COMPARISON:  06/10/2018 FINDINGS: 1719 hours. Asymmetric airspace disease, right greater than left suggest multifocal pneumonia although asymmetric edema could have this appearance. Probable tiny right pleural effusion. The cardio pericardial silhouette is enlarged. The visualized bony structures of the thorax are intact. Insert wire IMPRESSION: Right greater than left airspace disease  compatible with diffuse infection or asymmetric edema. Tiny right effusion. Electronically Signed    By: Misty Stanley M.D.   On: 09/16/2018 18:06   Ct Maxillofacial Wo Contrast  Result Date: 09/17/2018 CLINICAL DATA:  Fever and facial pain EXAM: CT MAXILLOFACIAL WITHOUT CONTRAST TECHNIQUE: Multidetector CT imaging of the maxillofacial structures was performed. Multiplanar CT image reconstructions were also generated. COMPARISON:  None. FINDINGS: Osseous: No facial fracture. Dental: The patient is edentulous. Orbits: The globes are intact. Normal appearance of the intra- and extraconal fat. Symmetric extraocular muscles. Sinuses: No fluid levels or advanced mucosal thickening. Soft tissues: Normal visualized extracranial soft tissues. Limited intracranial: Normal. IMPRESSION: Clear sinuses.  No specific finding to explain facial pain. Electronically Signed   By: Ulyses Jarred M.D.   On: 09/17/2018 13:43     CBC Recent Labs  Lab 09/16/18 1717 09/17/18 0302 09/18/18 0654  WBC 7.9 10.9* 12.4*  HGB 11.0* 11.1* 11.0*  HCT 32.6* 32.9* 33.3*  PLT 256 242 255  MCV 88.8 87.7 89.3  MCH 30.0 29.6 29.5  MCHC 33.7 33.7 33.0  RDW 14.5 14.3 14.3  LYMPHSABS 1.4  --   --   MONOABS 0.5  --   --   EOSABS 0.2  --   --   BASOSABS 0.1  --   --     Chemistries  Recent Labs  Lab 09/16/18 1717 09/17/18 0302 09/18/18 0654  NA 127* 126* 131*  K 4.8 4.4 4.0  CL 95* 93* 98  CO2 22 20* 19*  GLUCOSE 136* 111* 142*  BUN 33* 29* 34*  CREATININE 1.10* 1.10* 1.30*  CALCIUM 9.3 9.1 9.1  AST 19  --   --   ALT 12  --   --   ALKPHOS 103  --   --   BILITOT 0.5  --   --    ------------------------------------------------------------------------------------------------------------------ estimated creatinine clearance is 34.6 mL/min (A) (by C-G formula based on SCr of 1.3 mg/dL (H)). ------------------------------------------------------------------------------------------------------------------ No results for input(s): HGBA1C in the last 72  hours. ------------------------------------------------------------------------------------------------------------------ No results for input(s): CHOL, HDL, LDLCALC, TRIG, CHOLHDL, LDLDIRECT in the last 72 hours. ------------------------------------------------------------------------------------------------------------------ No results for input(s): TSH, T4TOTAL, T3FREE, THYROIDAB in the last 72 hours.  Invalid input(s): FREET3 ------------------------------------------------------------------------------------------------------------------ Recent Labs    09/19/18 0542  FERRITIN 221    Coagulation profile No results for input(s): INR, PROTIME in the last 168 hours.  No results for input(s): DDIMER in the last 72 hours.  Cardiac Enzymes Recent Labs  Lab 09/16/18 1717  TROPONINI <0.03   ------------------------------------------------------------------------------------------------------------------ Invalid input(s): POCBNP    Assessment & Plan   Patient is 85 year old presenting with shortness of breath  1 acute on chronic respiratory failure multifactorial including due to acute CHF, acute COPD flare and possible pneumonia Appreciate pulmonary input prognosis very poor in this patient  2 acute on chronic diastolic CHF continue IV Lasix patient diuresing well  3 acute on chronic COPD exasperation continue nebs to every 6, continue Pulmicort continue Solu-Medrol I will add Mucomyst  4.  Hyponatremia likely due to fluid overload sodium improving  5.    HTN (hypertension) -continue hydralazine lisinopril  6.  CAD (coronary artery disease) -continue home medications with aspirin  7.  Hypothyroidism continue Synthroid  8.  Hyperlipidemia continue Pravachol  9.  Severe pulmonary hypertension continue Revatio  10.  Miscellaneous heparin for DVT prophylaxis     Code Status Orders  (From admission, onward)         Start  Ordered   09/17/18 1057  Do not  attempt resuscitation (DNR)  Continuous    Question Answer Comment  In the event of cardiac or respiratory ARREST Do not call a "code blue"   In the event of cardiac or respiratory ARREST Do not perform Intubation, CPR, defibrillation or ACLS   In the event of cardiac or respiratory ARREST Use medication by any route, position, wound care, and other measures to relive pain and suffering. May use oxygen, suction and manual treatment of airway obstruction as needed for comfort.      09/17/18 1056        Code Status History    Date Active Date Inactive Code Status Order ID Comments User Context   09/16/2018 2306 09/17/2018 1056 Full Code [id number]  Lance Coon, MD Inpatient   06/22/2018 1823 06/26/2018 1816 Full Code [id number]  Lance Coon, MD Inpatient   06/11/2018 0412 06/12/2018 1849 Full Code [id number]  Harrie Foreman, MD Inpatient   05/14/2017 1927 05/17/2017 1739 Full Code [id number]  Demetrios Loll, MD Inpatient   05/04/2017 1755 05/09/2017 1507 Full Code [id number]  Fritzi Mandes, MD Inpatient   06/21/2016 0959 06/21/2016 1413 Full Code [id number]  Isaias Cowman, MD Inpatient   02/13/2016 0417 02/15/2016 2021 Full Code [id number]  Hugelmeyer, Ubaldo Glassing, DO Inpatient           Consults cards  DVT Prophylaxis  Lovenox   Lab Results  Component Value Date   PLT 255 09/18/2018     Time Spent in minutes   59min  Greater than 50% of time spent in care coordination and counseling patient regarding the condition and plan of care.   Dustin Flock M.D on 09/20/2018 at 11:31 AM  Between 7am to 6pm - Pager - [phone]  After 6pm go to [email or web address] - Proofreader  [employer]   Office  [phone]

## 2018-09-20 NOTE — Progress Notes (Signed)
Plan to transition to comfort care after arrival of family members.    Family members have now arrived, Dr. Posey Pronto notified.

## 2018-09-21 MED ORDER — LEVOTHYROXINE SODIUM 100 MCG PO TABS: 100.0000 ug | ORAL_TABLET | Freq: Every day | ORAL | Status: DC

## 2018-09-21 MED ORDER — SILDENAFIL CITRATE 20 MG PO TABS
60.0000 mg | ORAL_TABLET | Freq: Three times a day (TID) | ORAL | Status: DC
  Filled 2018-09-21: qty 3

## 2018-09-21 MED ORDER — HYDRALAZINE HCL 25 MG PO TABS: 25.0000 mg | ORAL_TABLET | Freq: Three times a day (TID) | ORAL | Status: DC

## 2018-09-21 MED ORDER — MORPHINE SULFATE (PF) 2 MG/ML IV SOLN
1.0000 mg | INTRAVENOUS | Status: DC | PRN
  Administered 2018-09-21 (×2): 1 mg via INTRAVENOUS
  Filled 2018-09-21 (×2): qty 1

## 2018-09-21 MED ORDER — SILDENAFIL CITRATE 20 MG PO TABS: 20.0000 mg | ORAL_TABLET | Freq: Three times a day (TID) | ORAL | Status: DC

## 2018-09-21 MED ORDER — PRAVASTATIN SODIUM 40 MG PO TABS: 40.0000 mg | ORAL_TABLET | Freq: Every day | ORAL | Status: DC

## 2018-09-21 MED ORDER — SALINE SPRAY 0.65 % NA SOLN
1.0000 | NASAL | Status: DC | PRN
  Administered 2018-09-21 – 2018-09-22 (×3): 1 via NASAL
  Filled 2018-09-21: qty 44

## 2018-09-21 MED ORDER — VENLAFAXINE HCL ER 75 MG PO CP24: 75.0000 mg | ORAL_CAPSULE | Freq: Every day | ORAL | Status: DC

## 2018-09-21 MED ORDER — VENLAFAXINE HCL ER 75 MG PO CP24
225.0000 mg | ORAL_CAPSULE | Freq: Every day | ORAL | Status: DC
  Administered 2018-09-23: 225 mg via ORAL
  Filled 2018-09-21 (×2): qty 1

## 2018-09-21 MED ORDER — AMLODIPINE BESYLATE 10 MG PO TABS: 10.0000 mg | ORAL_TABLET | Freq: Every day | ORAL | Status: DC

## 2018-09-21 MED ORDER — FLEET ENEMA 7-19 GM/118ML RE ENEM
1.0000 | ENEMA | Freq: Once | RECTAL | Status: AC
  Administered 2018-09-21: 1 via RECTAL

## 2018-09-21 MED ORDER — FUROSEMIDE 10 MG/ML IJ SOLN: 40.0000 mg | Freq: Two times a day (BID) | INTRAMUSCULAR | Status: DC

## 2018-09-21 MED ORDER — ASPIRIN EC 81 MG PO TBEC: 81.0000 mg | DELAYED_RELEASE_TABLET | Freq: Every day | ORAL | Status: DC

## 2018-09-21 MED ORDER — VENLAFAXINE HCL ER 75 MG PO CP24: 150.0000 mg | ORAL_CAPSULE | Freq: Every day | ORAL | Status: DC

## 2018-09-21 MED ORDER — LISINOPRIL 20 MG PO TABS: 40.0000 mg | ORAL_TABLET | Freq: Every day | ORAL | Status: DC

## 2018-09-21 NOTE — Progress Notes (Signed)
Advanced care plan.  Purpose of the Encounter: Goals of care Parties in Attendance: Patient herself and son over the phone  Patient's Decision Capacity: Intact  Subjective/Patient's story: Lisa Haney  is a 85 y.o. female who presents with chief complaint as above.  Patient presents the ED with progressive cough and shortness of breath.  She states that her son has been coughing for about a month, and she has been coughing for about a week.  Over the last 2 days or so her shortness of breath is gotten worse.  Patient was noted to have acute CHF and COPD exacerbation.  She was treated aggressively yet continued to be short of breath.  She was seen in consultation by pulmonary.  They discussed with her and the family regarding overall poor prognosis.  Patient was made comfort care.  Then it was decided that they did not want her to be comfort care.  I had to readmit discussed with the patient and son   Objective/Medical story  I discussed with the patient's son regarding her wishes they want her to be a DNR as well as comfort care.  I have discussed in detail with the son and patient will stop all central mild medications.  Goals of care determination:   Comfort care measures  CODE STATUS: DO NOT RESUSCITATE with comfort care measures   Time spent discussing advanced care planning: 16 minutes

## 2018-09-21 NOTE — Progress Notes (Signed)
Received a notification from nurse from yesterday evening stating that the family was upset about patient becoming comfort care and stopping her medication.  I called her son this morning to see what their wishes were.  Extensive discussion with the son.  He wanted me to call Dr.  her primary pulmonologist Dr. Christa See.  I gave him a call.  He recommended stopping her we will told me to stop her REVATIO , he also offered to take patient in transfer if the patient wished.  I reversed her comfort care orders from yesterday.  I went back to the patient's room and presented her the options.  She states that she does not want aggressive treatment and wants to be comfortable.  States that the morphine has helped her significantly.  So I called her son back letting her know his wishes.  States that he is okay with her being comfortable.  We will really resume comfort care order orders and stop any nonessential medications.

## 2018-09-21 NOTE — Progress Notes (Signed)
Pt c/o constipation. MD informed, and Fleets enema ordered.  Fleets enema given with only brown liquid returned.

## 2018-09-21 NOTE — Plan of Care (Signed)
  Problem: Education: Goal: Knowledge of General Education information will improve Description Including pain rating scale, medication(s)/side effects and non-pharmacologic comfort measures 09/21/2018 1819 by Aubery Lapping, RN Outcome: Progressing 09/21/2018 1819 by Aubery Lapping, RN Outcome: Progressing   Problem: Education: Goal: Ability to verbalize understanding of medication therapies will improve 09/21/2018 1819 by Aubery Lapping, RN Outcome: Progressing 09/21/2018 1819 by Aubery Lapping, RN Outcome: Progressing   Problem: Education: Goal: Ability to demonstrate management of disease process will improve 09/21/2018 1819 by Aubery Lapping, RN Outcome: Not Progressing Note:  Pt is on comfort care measures. 09/21/2018 1819 by Aubery Lapping, RN Outcome: Progressing Goal: Individualized Educational Video(s) 09/21/2018 1819 by Aubery Lapping, RN Outcome: Not Progressing 09/21/2018 1819 by Aubery Lapping, RN Outcome: Progressing   Problem: Activity: Goal: Capacity to carry out activities will improve 09/21/2018 1819 by Aubery Lapping, RN Outcome: Not Progressing Note:  Pt is on comfort care measures. 09/21/2018 1819 by Aubery Lapping, RN Outcome: Progressing   Problem: Cardiac: Goal: Ability to achieve and maintain adequate cardiopulmonary perfusion will improve 09/21/2018 1819 by Aubery Lapping, RN Outcome: Not Progressing Note:  Pt is on comfort care measures. 09/21/2018 1819 by Aubery Lapping, RN Outcome: Progressing

## 2018-09-21 NOTE — Plan of Care (Signed)
  Problem: Education: Goal: Ability to verbalize understanding of medication therapies will improve Outcome: Progressing   

## 2018-09-21 NOTE — Progress Notes (Signed)
[place] at [facility]                                                                                                                                                                                  Patient Demographics   Lisa Haney, is a 85 y.o. female, DOB - July 15, 1926, WUJ:[id number]  Admit date - 09/16/2018   Admitting Physician Lance Coon, MD  Outpatient Primary MD for the patient is Kirk Ruths, MD   LOS - 5  Subjective: She states that her breathing is improved morphine has helped her a lot.  Review of Systems:   CONSTITUTIONAL: No documented fever. No fatigue, weakness. No weight gain, no weight loss.  EYES: No blurry or double vision.  ENT: No tinnitus. No postnasal drip. No redness of the oropharynx.  RESPIRATORY: Positive cough, positive wheeze, no hemoptysis.  Positive dyspnea.  CARDIOVASCULAR: No chest pain. No orthopnea. No palpitations. No syncope.  GASTROINTESTINAL: No nausea, no vomiting or diarrhea. No abdominal pain. No melena or hematochezia.  GENITOURINARY: No dysuria or hematuria.  ENDOCRINE: No polyuria or nocturia. No heat or cold intolerance.  HEMATOLOGY: No anemia. No bruising. No bleeding.  INTEGUMENTARY: No rashes. No lesions.  MUSCULOSKELETAL: No arthritis. No swelling. No gout.  NEUROLOGIC: No numbness, tingling, or ataxia. No seizure-type activity.  PSYCHIATRIC: No anxiety. No insomnia. No ADD.    Vitals:   Vitals:   09/20/18 2254 09/21/18 0415 09/21/18 0415 09/21/18 0754  BP:   (!) 125/55 (!) 152/68  Pulse: 100  79 (!) 105  Resp: 20  20   Temp:   98.3 F (36.8 C) (!) 97.5 F (36.4 C)  TempSrc:    Oral  SpO2: 92%  95% 90%  Weight:  93.9 kg    Height:        Wt Readings from Last 3 Encounters:  09/21/18 93.9 kg  06/21/18 90.7 kg  06/12/18 96.6 kg     Intake/Output Summary (Last 24 hours) at 09/21/2018 1249 Last data filed at 09/21/2018 1118 Gross per 24 hour  Intake 835.18 ml  Output 1550  ml  Net -714.82 ml    Physical Exam:   GENERAL: Pleasant-appearing in no apparent distress.  HEAD, EYES, EARS, NOSE AND THROAT: Atraumatic, normocephalic. Extraocular muscles are intact. Pupils equal and reactive to light. Sclerae anicteric. No conjunctival injection. No oro-pharyngeal erythema.  NECK: Supple. There is no jugular venous distention. No bruits, no lymphadenopathy, no thyromegaly.  HEART: Regular rate and rhythm,. No murmurs, no rubs, no clicks.  LUNGS: Rhonchus breath sounds bilaterally ABDOMEN: Soft, flat, nontender, nondistended. Has good bowel sounds. No hepatosplenomegaly appreciated.  EXTREMITIES: No evidence of any cyanosis,  clubbing, or positive peripheral edema.  +2 pedal and radial pulses bilaterally.  NEUROLOGIC: The patient is alert, awake, and oriented x3 with no focal motor or sensory deficits appreciated bilaterally.  SKIN: Moist and warm with no rashes appreciated.  Psych: Not anxious, depressed LN: No inguinal LN enlargement    Antibiotics   Anti-infectives (From admission, onward)   Start     Dose/Rate Route Frequency Ordered Stop   09/16/18 2200  ampicillin (OMNIPEN) 1 g in sodium chloride 0.9 % 100 mL IVPB  Status:  Discontinued     1 g 300 mL/hr over 20 Minutes Intravenous Every 8 hours 09/16/18 2017 09/16/18 2148   09/16/18 2145  Ampicillin-Sulbactam (UNASYN) 3 g in sodium chloride 0.9 % 100 mL IVPB  Status:  Discontinued     3 g 200 mL/hr over 30 Minutes Intravenous Every 6 hours 09/16/18 2141 09/20/18 1607      Medications   Scheduled Meds: . budesonide (PULMICORT) nebulizer solution  0.25 mg Nebulization BID  . fluticasone  2 spray Each Nare Daily  . guaiFENesin  600 mg Oral BID  . ipratropium-albuterol  3 mL Nebulization TID  . mouth rinse  15 mL Mouth Rinse BID  . Melatonin  5 mg Oral Q2000  . sodium phosphate  1 enema Rectal Once   Continuous Infusions: . sodium chloride 30 mL (09/20/18 0423)  . venlafaxine XR (EFFEXOR-XR) 24 hr  capsule 225 mg     PRN Meds:.sodium chloride, acetaminophen **OR** acetaminophen, albuterol, bisacodyl, morphine injection, morphine CONCENTRATE **OR** morphine CONCENTRATE, ondansetron **OR** ondansetron (ZOFRAN) IV   Data Review:   Micro Results Recent Results (from the past 240 hour(s))  SARS Coronavirus 2 (CEPHEID - Performed in [place] hospital lab), Hosp Order     Status: None   Collection Time: 09/16/18  8:01 PM  Result Value Ref Range Status   SARS Coronavirus 2 NEGATIVE NEGATIVE Final    Comment: (NOTE) If result is NEGATIVE SARS-CoV-2 target nucleic acids are NOT DETECTED. The SARS-CoV-2 RNA is generally detectable in upper and lower  respiratory specimens during the acute phase of infection. The lowest  concentration of SARS-CoV-2 viral copies this assay can detect is 250  copies / mL. A negative result does not preclude SARS-CoV-2 infection  and should not be used as the sole basis for treatment or other  patient management decisions.  A negative result may occur with  improper specimen collection / handling, submission of specimen other  than nasopharyngeal swab, presence of viral mutation(s) within the  areas targeted by this assay, and inadequate number of viral copies  (<250 copies / mL). A negative result must be combined with clinical  observations, patient history, and epidemiological information. If result is POSITIVE SARS-CoV-2 target nucleic acids are DETECTED. The SARS-CoV-2 RNA is generally detectable in upper and lower  respiratory specimens dur ing the acute phase of infection.  Positive  results are indicative of active infection with SARS-CoV-2.  Clinical  correlation with patient history and other diagnostic information is  necessary to determine patient infection status.  Positive results do  not rule out bacterial infection or co-infection with other viruses. If result is PRESUMPTIVE POSTIVE SARS-CoV-2 nucleic acids MAY BE PRESENT.   A  presumptive positive result was obtained on the submitted specimen  and confirmed on repeat testing.  While 2019 novel coronavirus  (SARS-CoV-2) nucleic acids may be present in the submitted sample  additional confirmatory testing may be necessary for epidemiological  and / or clinical  management purposes  to differentiate between  SARS-CoV-2 and other Sarbecovirus currently known to infect humans.  If clinically indicated additional testing with an alternate test  methodology [id number]) is advised. The SARS-CoV-2 RNA is generally  detectable in upper and lower respiratory sp ecimens during the acute  phase of infection. The expected result is Negative. Fact Sheet for Patients:  StrictlyIdeas.no Fact Sheet for Healthcare Providers: BankingDealers.co.za This test is not yet approved or cleared by the [place] FDA and has been authorized for detection and/or diagnosis of SARS-CoV-2 by FDA under an Emergency Use Authorization (EUA).  This EUA will remain in effect (meaning this test can be used) for the duration of the COVID-19 declaration under Section 564(b)(1) of the Act, 21 U.S.C. section 360bbb-3(b)(1), unless the authorization is terminated or revoked sooner. Performed at [facility], [address] [place], [place] [address]   Respiratory Panel by PCR     Status: None   Collection Time: 09/18/18  9:25 PM  Result Value Ref Range Status   Adenovirus NOT DETECTED NOT DETECTED Final   Coronavirus 229E NOT DETECTED NOT DETECTED Final    Comment: (NOTE) The Coronavirus on the Respiratory Panel, DOES NOT test for the novel  Coronavirus (2019 nCoV)    Coronavirus HKU1 NOT DETECTED NOT DETECTED Final   Coronavirus NL63 NOT DETECTED NOT DETECTED Final   Coronavirus OC43 NOT DETECTED NOT DETECTED Final   Metapneumovirus NOT DETECTED NOT DETECTED Final   Rhinovirus / Enterovirus NOT DETECTED NOT DETECTED Final   Influenza A  NOT DETECTED NOT DETECTED Final   Influenza B NOT DETECTED NOT DETECTED Final   Parainfluenza Virus 1 NOT DETECTED NOT DETECTED Final   Parainfluenza Virus 2 NOT DETECTED NOT DETECTED Final   Parainfluenza Virus 3 NOT DETECTED NOT DETECTED Final   Parainfluenza Virus 4 NOT DETECTED NOT DETECTED Final   Respiratory Syncytial Virus NOT DETECTED NOT DETECTED Final   Bordetella pertussis NOT DETECTED NOT DETECTED Final   Chlamydophila pneumoniae NOT DETECTED NOT DETECTED Final   Mycoplasma pneumoniae NOT DETECTED NOT DETECTED Final    Comment: Performed at [facility], [place]. [address], [place], [place] [address]    Radiology Reports Dg Chest Portable 1 View  Result Date: 09/16/2018 CLINICAL DATA:  Worsening shortness of breath. EXAM: PORTABLE CHEST 1 VIEW COMPARISON:  06/10/2018 FINDINGS: 1719 hours. Asymmetric airspace disease, right greater than left suggest multifocal pneumonia although asymmetric edema could have this appearance. Probable tiny right pleural effusion. The cardio pericardial silhouette is enlarged. The visualized bony structures of the thorax are intact. Insert wire IMPRESSION: Right greater than left airspace disease compatible with diffuse infection or asymmetric edema. Tiny right effusion. Electronically Signed   By: Misty Stanley M.D.   On: 09/16/2018 18:06   Ct Maxillofacial Wo Contrast  Result Date: 09/17/2018 CLINICAL DATA:  Fever and facial pain EXAM: CT MAXILLOFACIAL WITHOUT CONTRAST TECHNIQUE: Multidetector CT imaging of the maxillofacial structures was performed. Multiplanar CT image reconstructions were also generated. COMPARISON:  None. FINDINGS: Osseous: No facial fracture. Dental: The patient is edentulous. Orbits: The globes are intact. Normal appearance of the intra- and extraconal fat. Symmetric extraocular muscles. Sinuses: No fluid levels or advanced mucosal thickening. Soft tissues: Normal visualized extracranial soft tissues. Limited intracranial:  Normal. IMPRESSION: Clear sinuses.  No specific finding to explain facial pain. Electronically Signed   By: Ulyses Jarred M.D.   On: 09/17/2018 13:43     CBC Recent Labs  Lab 09/16/18 1717 09/17/18 0302  09/18/18 0654  WBC 7.9 10.9* 12.4*  HGB 11.0* 11.1* 11.0*  HCT 32.6* 32.9* 33.3*  PLT 256 242 255  MCV 88.8 87.7 89.3  MCH 30.0 29.6 29.5  MCHC 33.7 33.7 33.0  RDW 14.5 14.3 14.3  LYMPHSABS 1.4  --   --   MONOABS 0.5  --   --   EOSABS 0.2  --   --   BASOSABS 0.1  --   --     Chemistries  Recent Labs  Lab 09/16/18 1717 09/17/18 0302 09/18/18 0654  NA 127* 126* 131*  K 4.8 4.4 4.0  CL 95* 93* 98  CO2 22 20* 19*  GLUCOSE 136* 111* 142*  BUN 33* 29* 34*  CREATININE 1.10* 1.10* 1.30*  CALCIUM 9.3 9.1 9.1  AST 19  --   --   ALT 12  --   --   ALKPHOS 103  --   --   BILITOT 0.5  --   --    ------------------------------------------------------------------------------------------------------------------ estimated creatinine clearance is 33.9 mL/min (A) (by C-G formula based on SCr of 1.3 mg/dL (H)). ------------------------------------------------------------------------------------------------------------------ No results for input(s): HGBA1C in the last 72 hours. ------------------------------------------------------------------------------------------------------------------ No results for input(s): CHOL, HDL, LDLCALC, TRIG, CHOLHDL, LDLDIRECT in the last 72 hours. ------------------------------------------------------------------------------------------------------------------ No results for input(s): TSH, T4TOTAL, T3FREE, THYROIDAB in the last 72 hours.  Invalid input(s): FREET3 ------------------------------------------------------------------------------------------------------------------ Recent Labs    09/19/18 0542  FERRITIN 221    Coagulation profile No results for input(s): INR, PROTIME in the last 168 hours.  No results for input(s): DDIMER in the last  72 hours.  Cardiac Enzymes Recent Labs  Lab 09/16/18 1717  TROPONINI <0.03   ------------------------------------------------------------------------------------------------------------------ Invalid input(s): POCBNP    Assessment & Plan   Patient is 85 year old presenting with shortness of breath  1 acute on chronic respiratory failure multifactorial including due to acute CHF, acute COPD flare and possible pneumonia Appreciate pulmonary input, I discussed the case with her pulmonologist Dr. Christa See as well. Patient has gone back and forth about her wishes for comfort care.  Finally she is decided that she wants to be comfort care.  2 acute on chronic diastolic CHF per her wishes will discontinue nonessential medication  3 acute on chronic COPD exasperation continue nebs for comfort  4.  Hyponatremia likely due to fluid overload sodium improving  5.    HTN (hypertension) -discontinue heparin  6.  CAD (coronary artery disease) -discontinue aspirin aspirin  7.  Hypothyroidism discontinue Synthroid  8.  Hyperlipidemia discontinue Pravachol  9.  Severe pulmonary hypertension discontinue Revatio  10.  Miscellaneous heparin for DVT prophylaxis     Code Status Orders  (From admission, onward)         Start     Ordered   09/17/18 1057  Do not attempt resuscitation (DNR)  Continuous    Question Answer Comment  In the event of cardiac or respiratory ARREST Do not call a "code blue"   In the event of cardiac or respiratory ARREST Do not perform Intubation, CPR, defibrillation or ACLS   In the event of cardiac or respiratory ARREST Use medication by any route, position, wound care, and other measures to relive pain and suffering. May use oxygen, suction and manual treatment of airway obstruction as needed for comfort.      09/17/18 1056        Code Status History    Date Active Date Inactive Code Status Order ID Comments User Context   09/16/2018 2306 09/17/2018 1056  Full  Code [id number]  Lance Coon, MD Inpatient   06/22/2018 1823 06/26/2018 1816 Full Code [id number]  Lance Coon, MD Inpatient   06/11/2018 0412 06/12/2018 1849 Full Code [id number]  Harrie Foreman, MD Inpatient   05/14/2017 1927 05/17/2017 1739 Full Code [id number]  Demetrios Loll, MD Inpatient   05/04/2017 1755 05/09/2017 1507 Full Code [id number]  Fritzi Mandes, MD Inpatient   06/21/2016 0959 06/21/2016 1413 Full Code [id number]  Isaias Cowman, MD Inpatient   02/13/2016 0417 02/15/2016 2021 Full Code [id number]  Hugelmeyer, Ubaldo Glassing, DO Inpatient           Consults cards  DVT Prophylaxis  Lovenox   Lab Results  Component Value Date   PLT 255 09/18/2018     Time Spent in minutes   36min  Greater than 50% of time spent in care coordination and counseling patient regarding the condition and plan of care.   Dustin Flock M.D on 09/21/2018 at 12:49 PM  Between 7am to 6pm - Pager - [phone]  After 6pm go to [email or web address] - Proofreader  [employer]   Office  [phone]

## 2018-09-22 ENCOUNTER — Inpatient Hospital Stay: Payer: Medicare Other

## 2018-09-22 MED ORDER — MORPHINE SULFATE (PF) 2 MG/ML IV SOLN
2.0000 mg | INTRAVENOUS | Status: DC | PRN
  Administered 2018-09-22 (×2): 2 mg via INTRAVENOUS
  Filled 2018-09-22 (×2): qty 1

## 2018-09-22 NOTE — Progress Notes (Signed)
Update on pt's status provided to pt's son, [name].  Pt has audible wheezes. Neb treatment provided.

## 2018-09-22 NOTE — Progress Notes (Signed)
Pulmonary Medicine          Date: 09/22/2018,   MRN# [id number] Lisa Haney 1927/01/18     AdmissionWeight: 81.6 kg                 CurrentWeight: 94.3 kg     HISTORY OF PRESENT ILLNESS   Patient in no distress on morphine.  Code status DNR/comfort care.    PAST MEDICAL HISTORY   Past Medical History:  Diagnosis Date  . Arthritis   . Cancer ([place])    skin cancer on hand  . COPD (chronic obstructive pulmonary disease) ([place])   . Coronary artery disease    stent  . Diverticulitis   . Hypertension   . Stroke [facility])    TIA  . TIA (transient ischemic attack)      SURGICAL HISTORY   Past Surgical History:  Procedure Laterality Date  . ABDOMINAL HYSTERECTOMY    . CAROTID STENT    . CHOLECYSTECTOMY    . DILATION AND CURETTAGE OF UTERUS    . NASAL SINUS SURGERY    . OVARIAN CYST REMOVAL    . REPLACEMENT TOTAL KNEE Right   . TONSILLECTOMY       FAMILY HISTORY   No family history on file.   SOCIAL HISTORY   Social History   Tobacco Use  . Smoking status: Never Smoker  . Smokeless tobacco: Never Used  Substance Use Topics  . Alcohol use: No  . Drug use: No     MEDICATIONS    Home Medication:    Current Medication:  Current Facility-Administered Medications:  .  0.9 %  sodium chloride infusion, , Intravenous, PRN, Lance Coon, MD, Last Rate: 10 mL/hr at 09/20/18 0423, 30 mL at 09/20/18 0423 .  acetaminophen (TYLENOL) tablet 650 mg, 650 mg, Oral, Q6H PRN, 650 mg at 09/20/18 1142 **OR** acetaminophen (TYLENOL) suppository 650 mg, 650 mg, Rectal, Q6H PRN, Lance Coon, MD .  albuterol (PROVENTIL) (2.5 MG/3ML) 0.083% nebulizer solution 2.5 mg, 2.5 mg, Nebulization, Q2H PRN, Dustin Flock, MD, 2.5 mg at 09/21/18 1352 .  bisacodyl (DULCOLAX) EC tablet 5 mg, 5 mg, Oral, Daily PRN, Seals, Angela H, NP, 5 mg at 09/20/18 0640 .  budesonide (PULMICORT) nebulizer solution 0.25 mg, 0.25 mg, Nebulization, BID, Dustin Flock, MD, 0.25 mg at  09/21/18 2012 .  fluticasone (FLONASE) 50 MCG/ACT nasal spray 2 spray, 2 spray, Each Nare, Daily, Dustin Flock, MD, 2 spray at 09/21/18 1130 .  guaiFENesin (MUCINEX) 12 hr tablet 600 mg, 600 mg, Oral, BID, Dustin Flock, MD, 600 mg at 09/21/18 2113 .  ipratropium-albuterol (DUONEB) 0.5-2.5 (3) MG/3ML nebulizer solution 3 mL, 3 mL, Nebulization, TID, Dustin Flock, MD, 3 mL at 09/21/18 2012 .  MEDLINE mouth rinse, 15 mL, Mouth Rinse, BID, Dustin Flock, MD, 15 mL at 09/20/18 0857 .  Melatonin TABS 5 mg, 5 mg, Oral, Q2000, Lance Coon, MD, 5 mg at 09/21/18 2112 .  morphine 2 MG/ML injection 2 mg, 2 mg, Intravenous, Q2H PRN, Dustin Flock, MD .  morphine CONCENTRATE 10 MG/0.5ML oral solution 5 mg, 5 mg, Oral, Q2H PRN **OR** morphine CONCENTRATE 10 MG/0.5ML oral solution 5 mg, 5 mg, Sublingual, Q2H PRN, Dustin Flock, MD .  ondansetron (ZOFRAN) tablet 4 mg, 4 mg, Oral, Q6H PRN **OR** ondansetron (ZOFRAN) injection 4 mg, 4 mg, Intravenous, Q6H PRN, Lance Coon, MD .  sodium chloride (OCEAN) 0.65 % nasal spray 1 spray, 1 spray, Each Nare, PRN, Lance Coon, MD, 1  spray at 09/22/18 0650 .  venlafaxine XR (EFFEXOR-XR) 24 hr capsule 225 mg, 225 mg, Oral, Daily, Dustin Flock, MD    ALLERGIES   Doxycycline     REVIEW OF SYSTEMS    Review of Systems:  Gen:  Denies  fever, sweats, chills weigh loss  HEENT: Denies blurred vision, double vision, ear pain, eye pain, hearing loss, nose bleeds, sore throat Cardiac:  No dizziness, chest pain or heaviness, chest tightness,edema Resp:  SOB+ Gi: Denies swallowing difficulty, stomach pain, nausea or vomiting, diarrhea, constipation, bowel incontinence Gu:  Denies bladder incontinence, burning urine Ext:   Denies Joint pain, stiffness or swelling Skin: Denies  skin rash, easy bruising or bleeding or hives Endoc:  Denies polyuria, polydipsia , polyphagia or weight change Psych:   Denies depression, insomnia or hallucinations   Other:   All other systems negative   VS: BP (!) 182/79 (BP Location: Right Arm)   Pulse 100   Temp 98.6 F (37 C) (Oral)   Resp 20   Ht 5\' 6"  (1.676 m)   Wt 94.3 kg   SpO2 98%   BMI 33.56 kg/m      PHYSICAL EXAM    GENERAL:NAD, no fevers, chills, no weakness no fatigue HEAD: Normocephalic, atraumatic.  EYES: Pupils equal, round, reactive to light. Extraocular muscles intact. No scleral icterus.  MOUTH: Moist mucosal membrane. Dentition intact. No abscess noted.  EAR, NOSE, THROAT: Clear without exudates. No external lesions.  NECK: Supple. No thyromegaly. No nodules. No JVD.  PULMONARY: mild rhonchi and bibasilar crackles CARDIOVASCULAR: S1 and S2. Regular rate and rhythm. No murmurs, rubs, or gallops. No edema. Pedal pulses 2+ bilaterally.  GASTROINTESTINAL: Soft, nontender, nondistended. No masses. Positive bowel sounds. No hepatosplenomegaly.  MUSCULOSKELETAL: No swelling, clubbing, or edema. Range of motion full in all extremities.  NEUROLOGIC: Cranial nerves II through XII are intact. No gross focal neurological deficits. Sensation intact. Reflexes intact.  SKIN: No ulceration, lesions, rashes, or cyanosis. Skin warm and dry. Turgor intact.  PSYCHIATRIC: Mood, affect within normal limits. The patient is awake, in no distrress. Insight, judgment intact.       IMAGING    Dg Chest 2 View  Result Date: 09/22/2018 CLINICAL DATA:  85 year old female with history of shortness of breath and cough. Worsening shortness of breath. EXAM: CHEST - 2 VIEW COMPARISON:  Chest x-ray 09/16/2018. FINDINGS: Significantly worsening patchy multifocal somewhat nodular airspace disease scattered throughout the lungs bilaterally, asymmetrically distributed with relative sparing of the portions of the left upper lobe. No pleural effusions. No evidence of pulmonary edema. Heart size is mildly enlarged. Upper mediastinal contours are within normal limits. Aortic atherosclerosis. IMPRESSION: 1. Marked  worsening multilobar pneumonia, as above. 2. Aortic atherosclerosis. Electronically Signed   By: Vinnie Langton M.D.   On: 09/22/2018 07:22   Dg Chest Portable 1 View  Result Date: 09/16/2018 CLINICAL DATA:  Worsening shortness of breath. EXAM: PORTABLE CHEST 1 VIEW COMPARISON:  06/10/2018 FINDINGS: 1719 hours. Asymmetric airspace disease, right greater than left suggest multifocal pneumonia although asymmetric edema could have this appearance. Probable tiny right pleural effusion. The cardio pericardial silhouette is enlarged. The visualized bony structures of the thorax are intact. Insert wire IMPRESSION: Right greater than left airspace disease compatible with diffuse infection or asymmetric edema. Tiny right effusion. Electronically Signed   By: Misty Stanley M.D.   On: 09/16/2018 18:06   Ct Maxillofacial Wo Contrast  Result Date: 09/17/2018 CLINICAL DATA:  Fever and facial pain EXAM: CT MAXILLOFACIAL WITHOUT  CONTRAST TECHNIQUE: Multidetector CT imaging of the maxillofacial structures was performed. Multiplanar CT image reconstructions were also generated. COMPARISON:  None. FINDINGS: Osseous: No facial fracture. Dental: The patient is edentulous. Orbits: The globes are intact. Normal appearance of the intra- and extraconal fat. Symmetric extraocular muscles. Sinuses: No fluid levels or advanced mucosal thickening. Soft tissues: Normal visualized extracranial soft tissues. Limited intracranial: Normal. IMPRESSION: Clear sinuses.  No specific finding to explain facial pain. Electronically Signed   By: Ulyses Jarred M.D.   On: 09/17/2018 13:43      ASSESSMENT/PLAN   Acute hypoxemic respiratory failure - CT chest with bilateral diffuse airspace ground glass opacification - initially with hypothermia T96 and tachypnea RR26 with accelerated htn - possible viral LRTI related- COVID negative , will order RVP now - Blood cultures neg to date  - Empirically on Unasyn IV 3g Q6H -  will order ferritin to evalute acute phase reaction - BetaDglucan for eval for fungal etiology      Pulmonary arterial hyertension  -NYHA/WHO class 4 at this time -Group 2-3 most likely  - Severe - Mean PAP >50, appears to be both pre and post capillary although no CO/CI found on RHC - Currenlty on Revatio TID - would not recommend aggressive therapy as pt is DNR and has high risk for decompensation  - will incerase diuresis to 40 BID IV  - overall very poor prognosis -REVEAL registry score >16 = less then 1 year survival   -patient continues to have increased O2 req - currently on BIPAP with 6L/min O2 bleed in.     - on comfort measures now.    COPD  - unstaged - patient has not been followed by pulmonary in past  - discussed pulmonary clinic evaluation post d/c with patient - will set up appt - currently on Solumedrol 60 q6h - will changed to bid  - continue Duoneb q6h prn and pulmicort  - chest physiotherapy and incentive spirometry    -patient clearly expresses that she asks to be comfortable with no aggressive measures and family initially was unclear but after further discussion with patient understands her wishes.   We will continue to respect patient and family wishes for comfort measures.  Dr Posey Pronto has also reached out to previous pulmonary provider to discuss care plan and any additional options but patient declined and again wishes for comfort measures.  Additionally Palliative care team has been consulted.  I will be available if patient or family wish to meet for additional discourse.    Thank you for allowing me to participate in the care of this patient.   Patient/Family are satisfied with care plan and all questions have been answered.  This document was prepared using Dragon voice recognition software and may include unintentional dictation errors.     Ottie Glazier, M.D.  Division of [place]

## 2018-09-22 NOTE — TOC Progression Note (Signed)
Transition of Care [facility]) - Progression Note    Patient Details  Name: Lisa Haney MRN: [id number] Date of Birth: [birth year]  Transition of Care [facility]) CM/SW Contact  Beverly Sessions, RN Phone Number: 09/22/2018, 10:30 AM  Clinical Narrative:    Notified by MD that patient and family requesting information on Hospice Home.  RNCM spoke with son [place].  [name] is in agreement to pursue hospice home. Clinton states that it is not feasible for patient to return home with hospice services as there would be extended hours patient would be home alone  Due to [place] offices are closed.  Referral faxed to [phone].  RNCM spoke with Debbie in admission.  Currently there is a wait list.  Notified Debbie of referral sent  Son and MD updated   Expected Discharge Plan: [place] Barriers to Discharge: Hospice Bed not available  Expected Discharge Plan and Services Expected Discharge Plan: [place] arrangements for the past 2 months: Single Family Home                                       Social Determinants of Health (SDOH) Interventions    Readmission Risk Interventions Readmission Risk Prevention Plan 09/17/2018  Transportation Screening Complete  PCP or Specialist Appt within 3-5 Days Complete  HRI or Home Care Consult Complete  Social Work Consult for [place] Planning/Counseling Not Complete  SW consult not completed comments na  Palliative Care Screening Not Applicable  Medication Review [employer]) Complete  Some recent data might be hidden

## 2018-09-22 NOTE — Plan of Care (Signed)
Pt on comfort care.  Using BiPAP intermittently throughout shift while resting/sleeping.  Family at bedside for emotional and spiritual support.  Problem: Education: Goal: Knowledge of General Education information will improve Description Including pain rating scale, medication(s)/side effects and non-pharmacologic comfort measures Outcome: Progressing   Problem: Education: Goal: Ability to demonstrate management of disease process will improve Outcome: Progressing Goal: Ability to verbalize understanding of medication therapies will improve Outcome: Progressing Goal: Individualized Educational Video(s) Outcome: Progressing   Problem: Activity: Goal: Capacity to carry out activities will improve Outcome: Progressing   Problem: Cardiac: Goal: Ability to achieve and maintain adequate cardiopulmonary perfusion will improve Outcome: Progressing

## 2018-09-22 NOTE — Care Management Important Message (Signed)
Important Message  Patient Details  Name: Lisa Haney MRN: [id number] Date of Birth: [birth year]   Medicare Important Message Given:  Yes    Dannette Barbara 09/22/2018, 11:44 AM

## 2018-09-22 NOTE — Progress Notes (Signed)
[facility]                                                                                                                                                                                  Patient Demographics   Lisa Haney, is a 85 y.o. female, DOB - 01/18/1927, VQM:[id number]  Admit date - 09/16/2018   Admitting Physician Lance Coon, MD  Outpatient Primary MD for the patient is Kirk Ruths, MD   LOS - 6  Subjective: Patient this morning is more short of breath on 5 L oxygen requesting morphine.  Review of Systems:   CONSTITUTIONAL: No documented fever. No fatigue, weakness. No weight gain, no weight loss.  EYES: No blurry or double vision.  ENT: No tinnitus. No postnasal drip. No redness of the oropharynx.  RESPIRATORY: Positive cough, positive wheeze, no hemoptysis.  Positive dyspnea.  CARDIOVASCULAR: No chest pain. No orthopnea. No palpitations. No syncope.  GASTROINTESTINAL: No nausea, no vomiting or diarrhea. No abdominal pain. No melena or hematochezia.  GENITOURINARY: No dysuria or hematuria.  ENDOCRINE: No polyuria or nocturia. No heat or cold intolerance.  HEMATOLOGY: No anemia. No bruising. No bleeding.  INTEGUMENTARY: No rashes. No lesions.  MUSCULOSKELETAL: No arthritis. No swelling. No gout.  NEUROLOGIC: No numbness, tingling, or ataxia. No seizure-type activity.  PSYCHIATRIC: No anxiety. No insomnia. No ADD.    Vitals:   Vitals:   09/21/18 1721 09/21/18 2002 09/22/18 0345 09/22/18 0749  BP: (!) 168/57 (!) 152/53 (!) 148/50 (!) 182/79  Pulse: 99 99 94 100  Resp: 18 20 18 20   Temp: (!) 97.5 F (36.4 C) 97.6 F (36.4 C) 98.2 F (36.8 C) 98.6 F (37 C)  TempSrc: Oral Oral Oral Oral  SpO2: 91% 95% 94% 98%  Weight:   94.3 kg   Height:        Wt Readings from Last 3 Encounters:  09/22/18 94.3 kg  06/21/18 90.7 kg  06/12/18 96.6 kg     Intake/Output Summary (Last 24 hours) at 09/22/2018 1204 Last data  filed at 09/22/2018 0751 Gross per 24 hour  Intake 360 ml  Output 950 ml  Net -590 ml    Physical Exam:   GENERAL: Pleasant-appearing in no apparent distress.  HEAD, EYES, EARS, NOSE AND THROAT: Atraumatic, normocephalic. Extraocular muscles are intact. Pupils equal and reactive to light. Sclerae anicteric. No conjunctival injection. No oro-pharyngeal erythema.  NECK: Supple. There is no jugular venous distention. No bruits, no lymphadenopathy, no thyromegaly.  HEART: Regular rate and rhythm,. No murmurs, no rubs, no clicks.  LUNGS: Rhonchus breath sounds bilaterally ABDOMEN: Soft, flat, nontender, nondistended. Has good bowel sounds. No hepatosplenomegaly  appreciated.  EXTREMITIES: No evidence of any cyanosis, clubbing, or positive peripheral edema.  +2 pedal and radial pulses bilaterally.  NEUROLOGIC: The patient is alert, awake, and oriented x3 with no focal motor or sensory deficits appreciated bilaterally.  SKIN: Moist and warm with no rashes appreciated.  Psych: Not anxious, depressed LN: No inguinal LN enlargement    Antibiotics   Anti-infectives (From admission, onward)   Start     Dose/Rate Route Frequency Ordered Stop   09/16/18 2200  ampicillin (OMNIPEN) 1 g in sodium chloride 0.9 % 100 mL IVPB  Status:  Discontinued     1 g 300 mL/hr over 20 Minutes Intravenous Every 8 hours 09/16/18 2017 09/16/18 2148   09/16/18 2145  Ampicillin-Sulbactam (UNASYN) 3 g in sodium chloride 0.9 % 100 mL IVPB  Status:  Discontinued     3 g 200 mL/hr over 30 Minutes Intravenous Every 6 hours 09/16/18 2141 09/20/18 1607      Medications   Scheduled Meds: . budesonide (PULMICORT) nebulizer solution  0.25 mg Nebulization BID  . fluticasone  2 spray Each Nare Daily  . guaiFENesin  600 mg Oral BID  . ipratropium-albuterol  3 mL Nebulization TID  . mouth rinse  15 mL Mouth Rinse BID  . Melatonin  5 mg Oral Q2000   Continuous Infusions: . sodium chloride 30 mL (09/20/18 0423)  .  venlafaxine XR (EFFEXOR-XR) 24 hr capsule 225 mg     PRN Meds:.sodium chloride, acetaminophen **OR** acetaminophen, albuterol, bisacodyl, morphine injection, morphine CONCENTRATE **OR** morphine CONCENTRATE, ondansetron **OR** ondansetron (ZOFRAN) IV, sodium chloride   Data Review:   Micro Results Recent Results (from the past 240 hour(s))  SARS Coronavirus 2 (CEPHEID - Performed in [place] hospital lab), Hosp Order     Status: None   Collection Time: 09/16/18  8:01 PM  Result Value Ref Range Status   SARS Coronavirus 2 NEGATIVE NEGATIVE Final    Comment: (NOTE) If result is NEGATIVE SARS-CoV-2 target nucleic acids are NOT DETECTED. The SARS-CoV-2 RNA is generally detectable in upper and lower  respiratory specimens during the acute phase of infection. The lowest  concentration of SARS-CoV-2 viral copies this assay can detect is 250  copies / mL. A negative result does not preclude SARS-CoV-2 infection  and should not be used as the sole basis for treatment or other  patient management decisions.  A negative result may occur with  improper specimen collection / handling, submission of specimen other  than nasopharyngeal swab, presence of viral mutation(s) within the  areas targeted by this assay, and inadequate number of viral copies  (<250 copies / mL). A negative result must be combined with clinical  observations, patient history, and epidemiological information. If result is POSITIVE SARS-CoV-2 target nucleic acids are DETECTED. The SARS-CoV-2 RNA is generally detectable in upper and lower  respiratory specimens dur ing the acute phase of infection.  Positive  results are indicative of active infection with SARS-CoV-2.  Clinical  correlation with patient history and other diagnostic information is  necessary to determine patient infection status.  Positive results do  not rule out bacterial infection or co-infection with other viruses. If result is PRESUMPTIVE  POSTIVE SARS-CoV-2 nucleic acids MAY BE PRESENT.   A presumptive positive result was obtained on the submitted specimen  and confirmed on repeat testing.  While 2019 novel coronavirus  (SARS-CoV-2) nucleic acids may be present in the submitted sample  additional confirmatory testing may be necessary for epidemiological  and / or  clinical management purposes  to differentiate between  SARS-CoV-2 and other Sarbecovirus currently known to infect humans.  If clinically indicated additional testing with an alternate test  methodology [phone]) is advised. The SARS-CoV-2 RNA is generally  detectable in upper and lower respiratory sp ecimens during the acute  phase of infection. The expected result is Negative. Fact Sheet for Patients:  StrictlyIdeas.no Fact Sheet for Healthcare Providers: BankingDealers.co.za This test is not yet approved or cleared by the [place] FDA and has been authorized for detection and/or diagnosis of SARS-CoV-2 by FDA under an Emergency Use Authorization (EUA).  This EUA will remain in effect (meaning this test can be used) for the duration of the COVID-19 declaration under Section 564(b)(1) of the Act, 21 U.S.C. section 360bbb-3(b)(1), unless the authorization is terminated or revoked sooner. Performed at [facility], [address] [place], [place] [address]   Respiratory Panel by PCR     Status: None   Collection Time: 09/18/18  9:25 PM  Result Value Ref Range Status   Adenovirus NOT DETECTED NOT DETECTED Final   Coronavirus 229E NOT DETECTED NOT DETECTED Final    Comment: (NOTE) The Coronavirus on the Respiratory Panel, DOES NOT test for the novel  Coronavirus (2019 nCoV)    Coronavirus HKU1 NOT DETECTED NOT DETECTED Final   Coronavirus NL63 NOT DETECTED NOT DETECTED Final   Coronavirus OC43 NOT DETECTED NOT DETECTED Final   Metapneumovirus NOT DETECTED NOT DETECTED Final   Rhinovirus /  Enterovirus NOT DETECTED NOT DETECTED Final   Influenza A NOT DETECTED NOT DETECTED Final   Influenza B NOT DETECTED NOT DETECTED Final   Parainfluenza Virus 1 NOT DETECTED NOT DETECTED Final   Parainfluenza Virus 2 NOT DETECTED NOT DETECTED Final   Parainfluenza Virus 3 NOT DETECTED NOT DETECTED Final   Parainfluenza Virus 4 NOT DETECTED NOT DETECTED Final   Respiratory Syncytial Virus NOT DETECTED NOT DETECTED Final   Bordetella pertussis NOT DETECTED NOT DETECTED Final   Chlamydophila pneumoniae NOT DETECTED NOT DETECTED Final   Mycoplasma pneumoniae NOT DETECTED NOT DETECTED Final    Comment: Performed at [facility], [place]. [address], [place], [place] [address]    Radiology Reports Dg Chest 2 View  Result Date: 09/22/2018 CLINICAL DATA:  85 year old female with history of shortness of breath and cough. Worsening shortness of breath. EXAM: CHEST - 2 VIEW COMPARISON:  Chest x-ray 09/16/2018. FINDINGS: Significantly worsening patchy multifocal somewhat nodular airspace disease scattered throughout the lungs bilaterally, asymmetrically distributed with relative sparing of the portions of the left upper lobe. No pleural effusions. No evidence of pulmonary edema. Heart size is mildly enlarged. Upper mediastinal contours are within normal limits. Aortic atherosclerosis. IMPRESSION: 1. Marked worsening multilobar pneumonia, as above. 2. Aortic atherosclerosis. Electronically Signed   By: Vinnie Langton M.D.   On: 09/22/2018 07:22   Dg Chest Portable 1 View  Result Date: 09/16/2018 CLINICAL DATA:  Worsening shortness of breath. EXAM: PORTABLE CHEST 1 VIEW COMPARISON:  06/10/2018 FINDINGS: 1719 hours. Asymmetric airspace disease, right greater than left suggest multifocal pneumonia although asymmetric edema could have this appearance. Probable tiny right pleural effusion. The cardio pericardial silhouette is enlarged. The visualized bony structures of the thorax are intact. Insert wire  IMPRESSION: Right greater than left airspace disease compatible with diffuse infection or asymmetric edema. Tiny right effusion. Electronically Signed   By: Misty Stanley M.D.   On: 09/16/2018 18:06   Ct Maxillofacial Wo Contrast  Result Date: 09/17/2018 CLINICAL DATA:  Fever and facial pain EXAM: CT MAXILLOFACIAL WITHOUT CONTRAST TECHNIQUE: Multidetector CT imaging of the maxillofacial structures was performed. Multiplanar CT image reconstructions were also generated. COMPARISON:  None. FINDINGS: Osseous: No facial fracture. Dental: The patient is edentulous. Orbits: The globes are intact. Normal appearance of the intra- and extraconal fat. Symmetric extraocular muscles. Sinuses: No fluid levels or advanced mucosal thickening. Soft tissues: Normal visualized extracranial soft tissues. Limited intracranial: Normal. IMPRESSION: Clear sinuses.  No specific finding to explain facial pain. Electronically Signed   By: Ulyses Jarred M.D.   On: 09/17/2018 13:43     CBC Recent Labs  Lab 09/16/18 1717 09/17/18 0302 09/18/18 0654  WBC 7.9 10.9* 12.4*  HGB 11.0* 11.1* 11.0*  HCT 32.6* 32.9* 33.3*  PLT 256 242 255  MCV 88.8 87.7 89.3  MCH 30.0 29.6 29.5  MCHC 33.7 33.7 33.0  RDW 14.5 14.3 14.3  LYMPHSABS 1.4  --   --   MONOABS 0.5  --   --   EOSABS 0.2  --   --   BASOSABS 0.1  --   --     Chemistries  Recent Labs  Lab 09/16/18 1717 09/17/18 0302 09/18/18 0654  NA 127* 126* 131*  K 4.8 4.4 4.0  CL 95* 93* 98  CO2 22 20* 19*  GLUCOSE 136* 111* 142*  BUN 33* 29* 34*  CREATININE 1.10* 1.10* 1.30*  CALCIUM 9.3 9.1 9.1  AST 19  --   --   ALT 12  --   --   ALKPHOS 103  --   --   BILITOT 0.5  --   --    ------------------------------------------------------------------------------------------------------------------ estimated creatinine clearance is 33.9 mL/min (A) (by C-G formula based on SCr of 1.3 mg/dL  (H)). ------------------------------------------------------------------------------------------------------------------ No results for input(s): HGBA1C in the last 72 hours. ------------------------------------------------------------------------------------------------------------------ No results for input(s): CHOL, HDL, LDLCALC, TRIG, CHOLHDL, LDLDIRECT in the last 72 hours. ------------------------------------------------------------------------------------------------------------------ No results for input(s): TSH, T4TOTAL, T3FREE, THYROIDAB in the last 72 hours.  Invalid input(s): FREET3 ------------------------------------------------------------------------------------------------------------------ No results for input(s): VITAMINB12, FOLATE, FERRITIN, TIBC, IRON, RETICCTPCT in the last 72 hours.  Coagulation profile No results for input(s): INR, PROTIME in the last 168 hours.  No results for input(s): DDIMER in the last 72 hours.  Cardiac Enzymes Recent Labs  Lab 09/16/18 1717  TROPONINI <0.03   ------------------------------------------------------------------------------------------------------------------ Invalid input(s): POCBNP    Assessment & Plan   Patient is 85 year old presenting with shortness of breath  1 acute on chronic respiratory failure multifactorial including due to acute CHF, acute COPD flare and possible pneumonia Appreciate pulmonary input, patient made comfort care Plan is for her to go to hospice home I will change morphine to every 2 hours  2. acute on chronic diastolic CHF per her wishes all nonessential medications have been discontinued  3 acute on chronic COPD exasperation continue nebs for comfort  4.  Hyponatremia likely due to fluid overload sodium improving  5.    HTN (hypertension) -off her medication to comfort care  6.  CAD (coronary artery disease) -off medications for comfort care  7.  Hypothyroidism discontinue  Synthroid  8.  Hyperlipidemia discontinue Pravachol  9.  Severe pulmonary hypertension discontinue Revatio  10.  Miscellaneous heparin for DVT prophylaxis     Code Status Orders  (From admission, onward)         Start     Ordered   09/17/18 1057  Do not attempt resuscitation (DNR)  Continuous    Question Answer Comment  In the event of cardiac or respiratory ARREST Do not call a "code blue"   In the event of cardiac or respiratory ARREST Do not perform Intubation, CPR, defibrillation or ACLS   In the event of cardiac or respiratory ARREST Use medication by any route, position, wound care, and other measures to relive pain and suffering. May use oxygen, suction and manual treatment of airway obstruction as needed for comfort.      09/17/18 1056        Code Status History    Date Active Date Inactive Code Status Order ID Comments User Context   09/16/2018 2306 09/17/2018 1056 Full Code [id number]  Lance Coon, MD Inpatient   06/22/2018 1823 06/26/2018 1816 Full Code [id number]  Lance Coon, MD Inpatient   06/11/2018 0412 06/12/2018 1849 Full Code [id number]  Harrie Foreman, MD Inpatient   05/14/2017 1927 05/17/2017 1739 Full Code [id number]  Demetrios Loll, MD Inpatient   05/04/2017 1755 05/09/2017 1507 Full Code [id number]  Fritzi Mandes, MD Inpatient   06/21/2016 0959 06/21/2016 1413 Full Code [id number]  Isaias Cowman, MD Inpatient   02/13/2016 0417 02/15/2016 2021 Full Code [id number]  Hugelmeyer, Ubaldo Glassing, DO Inpatient           Consults cards  DVT Prophylaxis  Lovenox   Lab Results  Component Value Date   PLT 255 09/18/2018     Time Spent in minutes   66min  Greater than 50% of time spent in care coordination and counseling patient regarding the condition and plan of care.   Dustin Flock M.D on 09/22/2018 at 12:04 PM  Between 7am to 6pm - Pager - [phone]  After 6pm go to [email or web address] - Proofreader  [employer]   Office  [phone]

## 2018-09-23 ENCOUNTER — Telehealth: Payer: Medicare Other | Admitting: Family

## 2018-09-23 MED ORDER — IPRATROPIUM-ALBUTEROL 0.5-2.5 (3) MG/3ML IN SOLN: 3.0000 mL | Freq: Three times a day (TID) | RESPIRATORY_TRACT | 0 refills | Status: AC

## 2018-09-23 MED ORDER — MORPHINE SULFATE (PF) 2 MG/ML IV SOLN: 2.0000 mg | INTRAVENOUS | 0 refills | Status: AC | PRN

## 2018-09-23 MED ORDER — MORPHINE SULFATE (CONCENTRATE) 10 MG/0.5ML PO SOLN: 5.0000 mg | ORAL | 0 refills | Status: AC | PRN

## 2018-09-23 NOTE — Progress Notes (Signed)
PT Cancellation Note  Patient Details Name: Lisa Haney MRN: [id number] DOB: 1927-01-27   Cancelled Treatment:    Reason Eval/Treat Not Completed: Other (comment): Noted per chart review that pt has transitioned to comfort care.  Will complete PT orders at this time but will reassess pt pending a change in status upon receipt of new PT orders.    Linus Salmons PT, DPT 09/23/18, 8:52 AM

## 2018-09-23 NOTE — Progress Notes (Signed)
New hospice home referral received from [place] on 5/25. Patient is an 85 year old woman with a known history of COPD, CAD, arthritis, HTN, Diverticulitis, and TIA admitted to [facility] from home on 5/22 with increased shortness of breath. She was treated for pneumonia, but has continued dyspnea, CXR on 5/25 showed multifocal PNA. Patient is requiring IV morphine PRN for dyspnea, 6 liters of oxygen and has had poor oral intake.  Attending physician Dr. Posey Pronto spoke with patient and her family and they have chosen to focus on comfort with transfer to the hospice home. Patient seen sitting up in bed, alert and oriented x 3, dyspnea noted with conversation and any exertion. Patient did express knowledge and agreement with plan for hospice home. Writer also spoke on the phone with her son Lisa Haney, who is also in agreement. Writer discussed with both patient and [place] that the BIPAP will not be used at the hospice home, that symptoms would be managed both with medication and other non pharmaceutical interventions, both expressed understanding. Lisa Haney just said "please don't let me gasp for breath". Writer provided reassurance and emotional support. Plan is for discharge to the hospice home today via EMS with signed DNR in place. Patient remains on 6 liters of oxygen via nasal cannula. Hospital team updated, report called to the hospice home, updated notes faxed to referral, EMS notified for transport. Thank you. Flo Shanks BSN, RN, [facility] [phone]

## 2018-09-23 NOTE — Discharge Summary (Signed)
[facility] [place] NAME: Lisa Haney    MR#:  [id number]  DATE OF BIRTH:  07-Aug-1926  DATE OF ADMISSION:  09/16/2018   ADMITTING PHYSICIAN: Lance Coon, MD  DATE OF DISCHARGE: 09/23/18  PRIMARY CARE PHYSICIAN: Kirk Ruths, MD   ADMISSION DIAGNOSIS:   Hyponatremia [E87.1] Hypoxia [R09.02] COPD exacerbation (HCC) [J44.1] Congestive heart failure, unspecified HF chronicity, unspecified heart failure type ([place]) [I50.9]  DISCHARGE DIAGNOSIS:   Principal Problem:   Acute on chronic diastolic CHF (congestive heart failure) (HCC) Active Problems:   HTN (hypertension)   CAD (coronary artery disease)   COPD exacerbation ([place])   SECONDARY DIAGNOSIS:   Past Medical History:  Diagnosis Date  . Arthritis   . Cancer ([place])    skin cancer on hand  . COPD (chronic obstructive pulmonary disease) ([place])   . Coronary artery disease    stent  . Diverticulitis   . Hypertension   . Stroke [facility])    TIA  . TIA (transient ischemic attack)     HOSPITAL COURSE:   Patient is 85 year old presenting with shortness of breath  1 acute on chronic respiratory failure multifactorial including due to acute CHF, acute COPD flare and possible pneumonia Appreciate pulmonary input, patient made comfort care Plan is for her to go to hospice home I will change morphine to every 2 hours  2. Acute on chronic diastolic CHF per her wishes all nonessential medications have been discontinued  3 acute on chronic COPD exasperation continue nebs for comfort  4.  Hyponatremia likely due to fluid overload sodium improving  5. HTN (hypertension) -off her medication to comfort care  6.CAD (coronary artery disease) -off medications for comfort care  7.  Hypothyroidism discontinue Synthroid  8.  Hyperlipidemia discontinue Pravachol  9.  Severe pulmonary hypertension discontinue Revatio   DISCHARGE CONDITIONS:  Proceeding to  comfort care  CONSULTS OBTAINED:   Treatment Team:  Corey Skains, MD Ottie Glazier, MD  DRUG ALLERGIES:   Allergies  Allergen Reactions  . Doxycycline Other (See Comments)    Severe upset stomach and pain    DISCHARGE MEDICATIONS:   Allergies as of 09/23/2018      Reactions   Doxycycline Other (See Comments)   Severe upset stomach and pain       Medication List    STOP taking these medications   acetaminophen 325 MG tablet Commonly known as:  TYLENOL   Albuterol Sulfate 108 (90 Base) MCG/ACT Aepb   amLODipine 10 MG tablet Commonly known as:  NORVASC   aspirin 81 MG tablet   cholecalciferol 1000 units tablet Commonly known as:  VITAMIN D   furosemide 40 MG tablet Commonly known as:  LASIX   hydrALAZINE 25 MG tablet Commonly known as:  APRESOLINE   levothyroxine 100 MCG tablet Commonly known as:  SYNTHROID   lisinopril 40 MG tablet Commonly known as:  ZESTRIL   loratadine 10 MG tablet Commonly known as:  CLARITIN   Melatonin 5 MG Tabs   potassium chloride 10 MEQ CR capsule Commonly known as:  MICRO-K   pravastatin 40 MG tablet Commonly known as:  PRAVACHOL   senna-docusate 8.6-50 MG tablet Commonly known as:  Senokot-S   sildenafil 20 MG tablet Commonly known as:  REVATIO     TAKE these medications   ipratropium-albuterol 0.5-2.5 (3) MG/3ML Soln Commonly known as:  DUONEB Take 3 mLs by nebulization 3 (three) times daily.   morphine 2 MG/ML  injection Inject 1 mL (2 mg total) into the vein every 2 (two) hours as needed (or dyspnea).   morphine CONCENTRATE 10 MG/0.5ML Soln concentrated solution Take 0.25 mLs (5 mg total) by mouth every 2 (two) hours as needed for moderate pain (or dyspnea).   venlafaxine XR 75 MG 24 hr capsule Commonly known as:  EFFEXOR-XR Take 75 mg by mouth daily.   venlafaxine XR 150 MG 24 hr capsule Commonly known as:  EFFEXOR-XR Take 150 mg by mouth daily.      DISCHARGE INSTRUCTIONS:    DIET:    Soft  ACTIVITY:   Bedrest  OXYGEN:   Home Oxygen: Yes.    Oxygen Delivery: 6 liters/min via Patient connected to nasal cannula oxygen  DISCHARGE LOCATION:   Hospice care home   If you experience worsening of your admission symptoms, develop shortness of breath, life threatening emergency, suicidal or homicidal thoughts you must seek medical attention immediately by calling 911 or calling your MD immediately  if symptoms less severe.  You Must read complete instructions/literature along with all the possible adverse reactions/side effects for all the Medicines you take and that have been prescribed to you. Take any new Medicines after you have completely understood and accpet all the possible adverse reactions/side effects.   Please note  You were cared for by a hospitalist during your hospital stay. If you have any questions about your discharge medications or the care you received while you were in the hospital after you are discharged, you can call the unit and asked to speak with the hospitalist on call if the hospitalist that took care of you is not available. Once you are discharged, your primary care physician will handle any further medical issues. Please note that NO REFILLS for any discharge medications will be authorized once you are discharged, as it is imperative that you return to your primary care physician (or establish a relationship with a primary care physician if you do not have one) for your aftercare needs so that they can reassess your need for medications and monitor your lab values.    On the day of Discharge:  VITAL SIGNS:   Blood pressure (!) 175/75, pulse 100, temperature 98 F (36.7 C), resp. rate 20, height 5\' 6"  (1.676 m), weight 95.5 kg, SpO2 92 %.  PHYSICAL EXAMINATION:    GENERAL:  85 y.o.-year-old patient lying in the bed with no acute distress.  EYES: Pupils equal, round, reactive to light and accommodation. No scleral icterus. Extraocular  muscles intact.  HEENT: Head atraumatic, normocephalic. Oropharynx and nasopharynx clear.  NECK:  Supple, no jugular venous distention. No thyroid enlargement, no tenderness.  LUNGS: Normal breath sounds bilaterally, no wheezing, rales,rhonchi or crepitation. No use of accessory muscles of respiration.  CARDIOVASCULAR: S1, S2 normal. No murmurs, rubs, or gallops.  ABDOMEN: Soft, non-tender, non-distended. Bowel sounds present. No organomegaly or mass.  EXTREMITIES: No pedal edema, cyanosis, or clubbing.  NEUROLOGIC: Cranial nerves II through XII are intact. Muscle strength 5/5 in all extremities. Sensation intact. Gait not checked.  PSYCHIATRIC: The patient is alert and oriented x 3.  SKIN: No obvious rash, lesion, or ulcer.   DATA REVIEW:   CBC Recent Labs  Lab 09/18/18 0654  WBC 12.4*  HGB 11.0*  HCT 33.3*  PLT 255    Chemistries  Recent Labs  Lab 09/16/18 1717  09/18/18 0654  NA 127*   < > 131*  K 4.8   < > 4.0  CL 95*   < >  98  CO2 22   < > 19*  GLUCOSE 136*   < > 142*  BUN 33*   < > 34*  CREATININE 1.10*   < > 1.30*  CALCIUM 9.3   < > 9.1  AST 19  --   --   ALT 12  --   --   ALKPHOS 103  --   --   BILITOT 0.5  --   --    < > = values in this interval not displayed.     Microbiology Results  Results for orders placed or performed during the hospital encounter of 09/16/18  SARS Coronavirus 2 (CEPHEID - Performed in [facility] hospital lab), Hosp Order     Status: None   Collection Time: 09/16/18  8:01 PM  Result Value Ref Range Status   SARS Coronavirus 2 NEGATIVE NEGATIVE Final    Comment: (NOTE) If result is NEGATIVE SARS-CoV-2 target nucleic acids are NOT DETECTED. The SARS-CoV-2 RNA is generally detectable in upper and lower  respiratory specimens during the acute phase of infection. The lowest  concentration of SARS-CoV-2 viral copies this assay can detect is 250  copies / mL. A negative result does not preclude SARS-CoV-2 infection  and should not be  used as the sole basis for treatment or other  patient management decisions.  A negative result may occur with  improper specimen collection / handling, submission of specimen other  than nasopharyngeal swab, presence of viral mutation(s) within the  areas targeted by this assay, and inadequate number of viral copies  (<250 copies / mL). A negative result must be combined with clinical  observations, patient history, and epidemiological information. If result is POSITIVE SARS-CoV-2 target nucleic acids are DETECTED. The SARS-CoV-2 RNA is generally detectable in upper and lower  respiratory specimens dur ing the acute phase of infection.  Positive  results are indicative of active infection with SARS-CoV-2.  Clinical  correlation with patient history and other diagnostic information is  necessary to determine patient infection status.  Positive results do  not rule out bacterial infection or co-infection with other viruses. If result is PRESUMPTIVE POSTIVE SARS-CoV-2 nucleic acids MAY BE PRESENT.   A presumptive positive result was obtained on the submitted specimen  and confirmed on repeat testing.  While 2019 novel coronavirus  (SARS-CoV-2) nucleic acids may be present in the submitted sample  additional confirmatory testing may be necessary for epidemiological  and / or clinical management purposes  to differentiate between  SARS-CoV-2 and other Sarbecovirus currently known to infect humans.  If clinically indicated additional testing with an alternate test  methodology [phone]) is advised. The SARS-CoV-2 RNA is generally  detectable in upper and lower respiratory sp ecimens during the acute  phase of infection. The expected result is Negative. Fact Sheet for Patients:  StrictlyIdeas.no Fact Sheet for Healthcare Providers: BankingDealers.co.za This test is not yet approved or cleared by the [place] FDA and has been authorized  for detection and/or diagnosis of SARS-CoV-2 by FDA under an Emergency Use Authorization (EUA).  This EUA will remain in effect (meaning this test can be used) for the duration of the COVID-19 declaration under Section 564(b)(1) of the Act, 21 U.S.C. section 360bbb-3(b)(1), unless the authorization is terminated or revoked sooner. Performed at [facility], [address], [place], [place] [address]   Respiratory Panel by PCR     Status: None   Collection Time: 09/18/18  9:25 PM  Result Value Ref Range Status  Adenovirus NOT DETECTED NOT DETECTED Final   Coronavirus 229E NOT DETECTED NOT DETECTED Final    Comment: (NOTE) The Coronavirus on the Respiratory Panel, DOES NOT test for the novel  Coronavirus (2019 nCoV)    Coronavirus HKU1 NOT DETECTED NOT DETECTED Final   Coronavirus NL63 NOT DETECTED NOT DETECTED Final   Coronavirus OC43 NOT DETECTED NOT DETECTED Final   Metapneumovirus NOT DETECTED NOT DETECTED Final   Rhinovirus / Enterovirus NOT DETECTED NOT DETECTED Final   Influenza A NOT DETECTED NOT DETECTED Final   Influenza B NOT DETECTED NOT DETECTED Final   Parainfluenza Virus 1 NOT DETECTED NOT DETECTED Final   Parainfluenza Virus 2 NOT DETECTED NOT DETECTED Final   Parainfluenza Virus 3 NOT DETECTED NOT DETECTED Final   Parainfluenza Virus 4 NOT DETECTED NOT DETECTED Final   Respiratory Syncytial Virus NOT DETECTED NOT DETECTED Final   Bordetella pertussis NOT DETECTED NOT DETECTED Final   Chlamydophila pneumoniae NOT DETECTED NOT DETECTED Final   Mycoplasma pneumoniae NOT DETECTED NOT DETECTED Final    Comment: Performed at [facility], [place] [address], [place], [place] [address]    RADIOLOGY:  No results found.   Management plans discussed with the patient, family and they are in agreement.  CODE STATUS:     Code Status Orders  (From admission, onward)         Start     Ordered   2018/09/25 1608  Do not attempt resuscitation (DNR)   Continuous    Question Answer Comment  In the event of cardiac or respiratory ARREST Do not call a "code blue"   In the event of cardiac or respiratory ARREST Do not perform Intubation, CPR, defibrillation or ACLS   In the event of cardiac or respiratory ARREST Use medication by any route, position, wound care, and other measures to relive pain and suffering. May use oxygen, suction and manual treatment of airway obstruction as needed for comfort.   Comments nurse may pronunce death      09/25/2018 1608        Code Status History    Date Active Date Inactive Code Status Order ID Comments User Context   09/17/2018 1056 2018/09/25 1608 DNR [id number]  Dustin Flock, MD Inpatient   09/16/2018 2306 09/17/2018 1056 Full Code [id number]  Lance Coon, MD Inpatient   06/22/2018 1823 06/26/2018 1816 Full Code [id number]  Lance Coon, MD Inpatient   06/11/2018 0412 06/12/2018 1849 Full Code [id number]  Harrie Foreman, MD Inpatient   05/14/2017 1927 05/17/2017 1739 Full Code [id number]  Demetrios Loll, MD Inpatient   05/04/2017 1755 05/09/2017 1507 Full Code [id number]  Fritzi Mandes, MD Inpatient   06/21/2016 0959 06/21/2016 1413 Full Code [id number]  Isaias Cowman, MD Inpatient   02/13/2016 0417 02/15/2016 2021 Full Code [id number]  Hugelmeyer, Ubaldo Glassing, DO Inpatient      TOTAL TIME TAKING CARE OF THIS PATIENT: 40 minutes.   This patient was staffed with Dr. Atha Starks, Manuella Ghazi who personally evaluated patient, reviewed documentation and agreed with discharge plan of care as above.  Rufina Falco, DNP, FNP-BC Hospitalist Nurse Practitioner   09/23/2018 at 12:33 PM  Between 7am to 6pm - Pager - [phone]  After 6pm go to [email or web address] - Technical brewer  Hospitalists  Office  [phone]  CC: Primary care physician; Kirk Ruths, MD   Note: This dictation was prepared with Dragon dictation along with smaller phrase technology. Any transcriptional errors that result  from this process  are unintentional.

## 2018-09-23 NOTE — Progress Notes (Signed)
EMS here for transport, pt with no complaints at discharge

## 2018-09-23 NOTE — Progress Notes (Signed)
Pt being discharged to hospice home, report called by Craige Cotta with hospice, awaiting EMS for transport, pt with no complaints

## 2018-09-23 NOTE — TOC Transition Note (Addendum)
Transition of Care [facility]) - CM/SW Discharge Note   Patient Details  Name: Lisa Haney MRN: [id number] Date of Birth: 1927/02/18  Transition of Care [facility]) CM/SW Contact:  Elza Rafter, RN Phone Number: 09/23/2018, 12:15 PM   Clinical Narrative:   Patient discharging to [facility] facility today.  EMS and face sheet placed on chart.     Final next level of care: [place] Barriers to Discharge: No Barriers Identified   Patient Goals and CMS Choice        Discharge Placement                       Discharge Plan and Services                                     Social Determinants of Health (SDOH) Interventions     Readmission Risk Interventions Readmission Risk Prevention Plan 09/17/2018  Transportation Screening Complete  PCP or Specialist Appt within 3-5 Days Complete  HRI or [place] Complete  Social Work Consult for [place] Planning/Counseling Not Complete  SW consult not completed comments na  Palliative Care Screening Not Applicable  Medication Review [employer]) Complete  Some recent data might be hidden

## 2018-09-23 NOTE — Progress Notes (Signed)
Attempted to let [name] son know x2 that pt being transported now to hospice home per pts request but no answer and voicemail box full

## 2018-09-24 LAB — FUNGITELL, SERUM: Fungitell Result: <31 pg/mL (ref <80)

## 2019-03-01 DEATH — deceased

## 2019-09-19 IMAGING — CT CT MAXILLOFACIAL WITHOUT CONTRAST
3 of 4 series · 16 of 47 positions shown, 19 images · non-contrast
Comparison: None.

CLINICAL DATA: Fever and facial pain

EXAM:
CT MAXILLOFACIAL WITHOUT CONTRAST
TECHNIQUE: Multidetector CT imaging of the maxillofacial structures was
performed. Multiplanar CT image reconstructions were also generated.

[Series 2: max soft · axial · 0.29mm/px · z∈[-245,-109]mm · 12 of 80 slices shown, 15 images]
[im 6/80  brain]
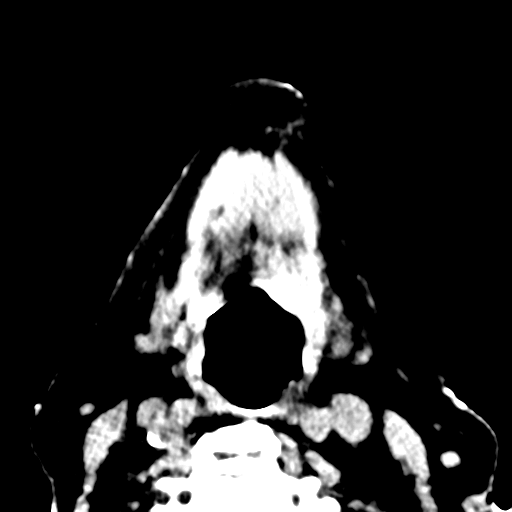
[im 6/80  bone]
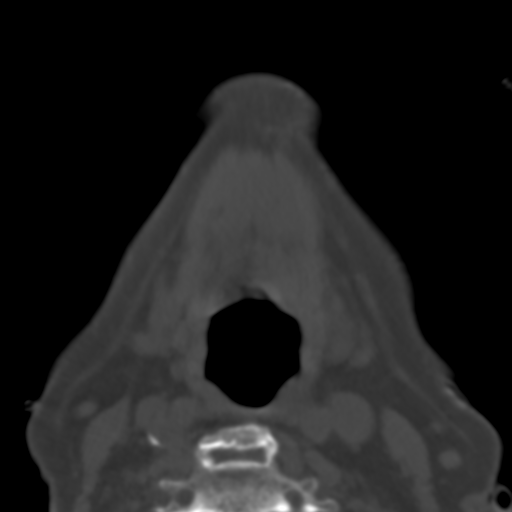
[im 11/80  bone]
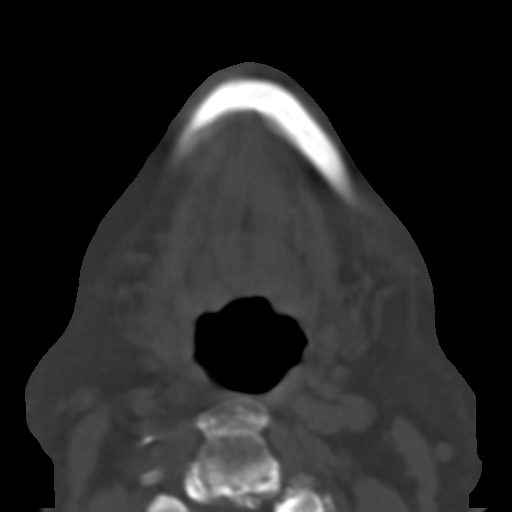
[im 17/80  bone]
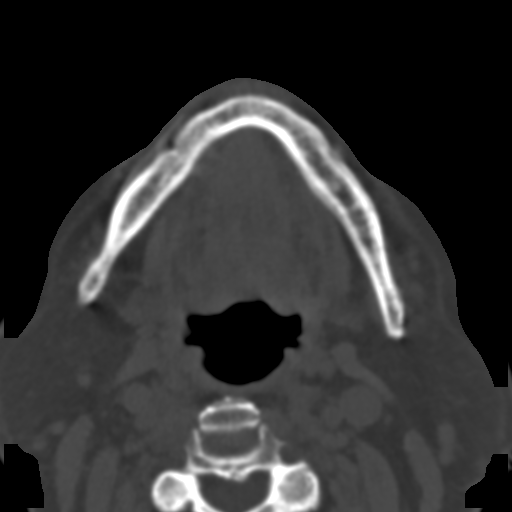
[im 25/80  bone]
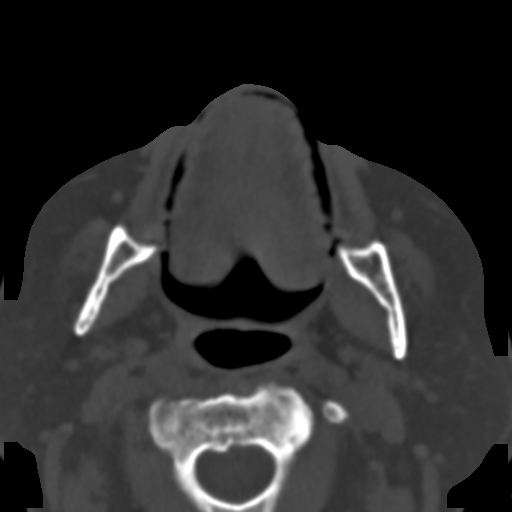
[im 30/80  brain]
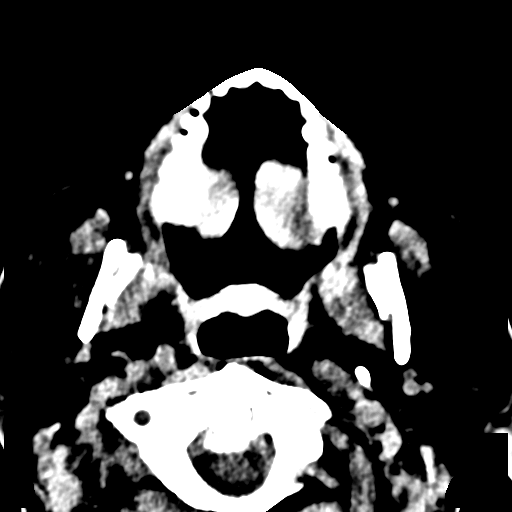
[im 30/80  bone]
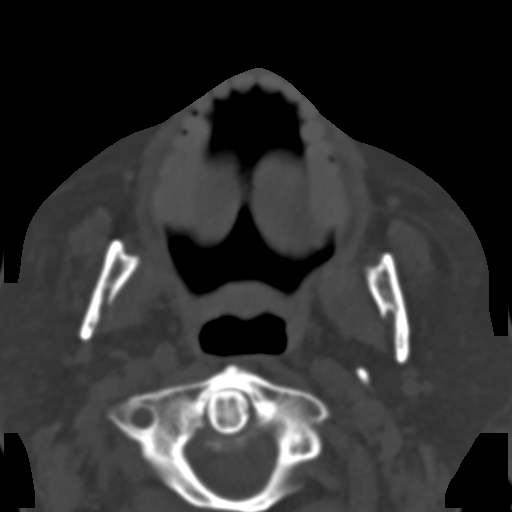
[im 36/80  bone]
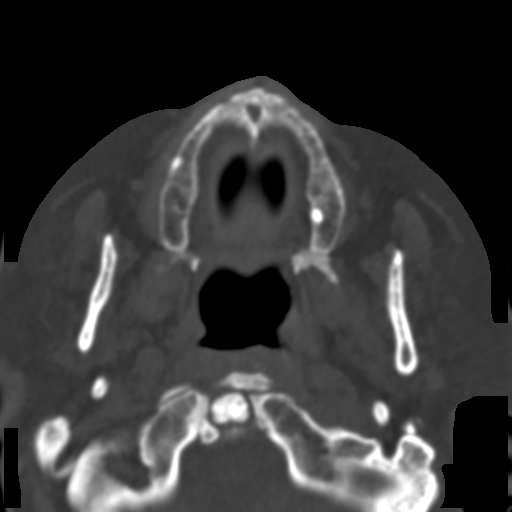
[im 44/80  bone]
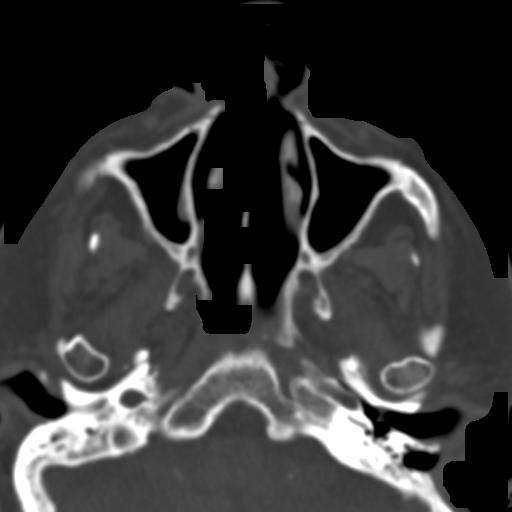
[im 50/80  bone]
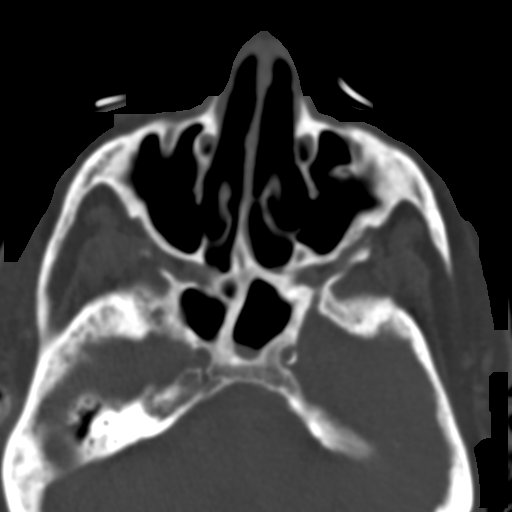
[im 55/80  brain]
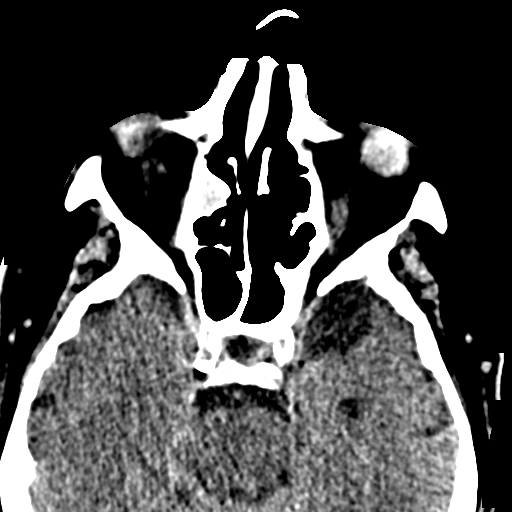
[im 55/80  bone]
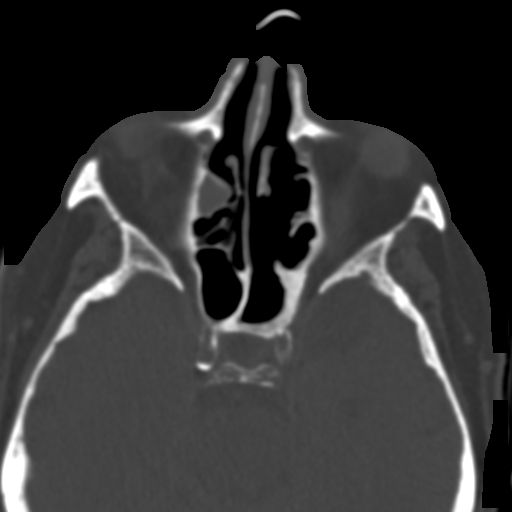
[im 63/80  bone]
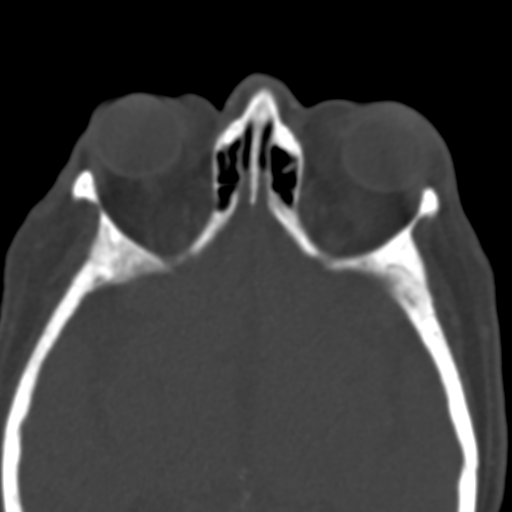
[im 69/80  bone]
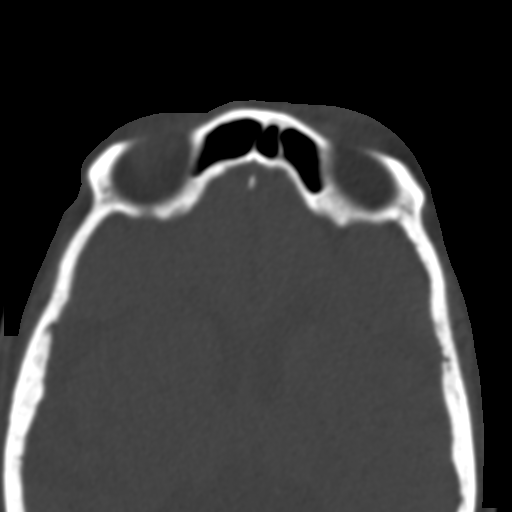
[im 74/80  bone]
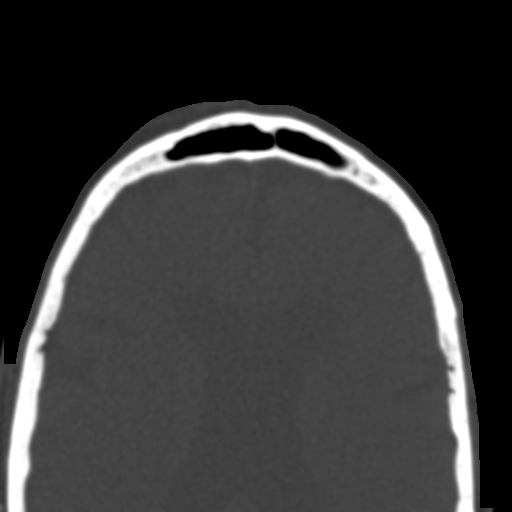

[Series 6: coronal soft · coronal · 0.30mm/px · 3 of 99 slices shown]
[im 33/99  bone]
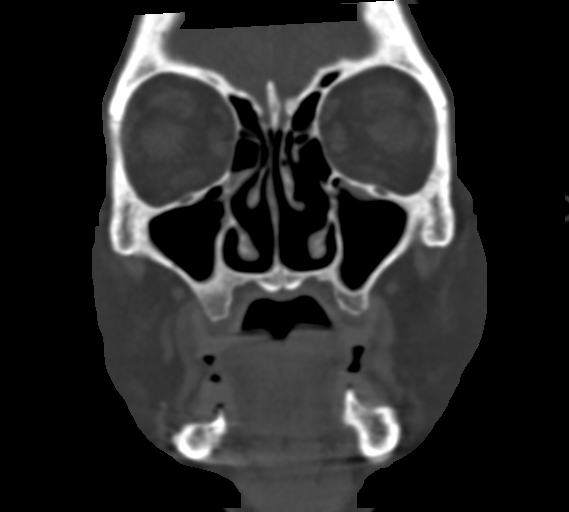
[im 44/99  bone]
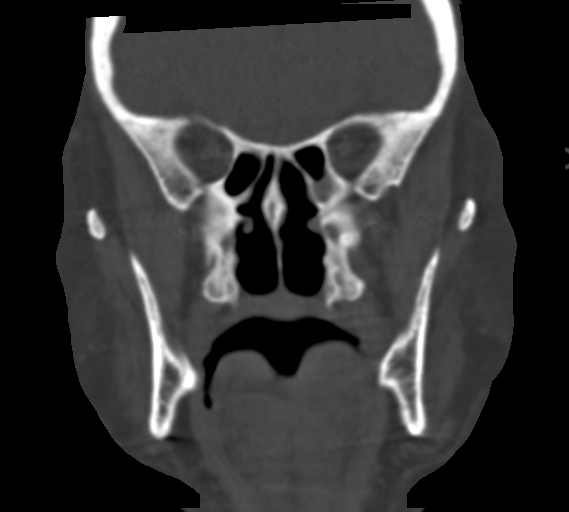
[im 55/99  bone]
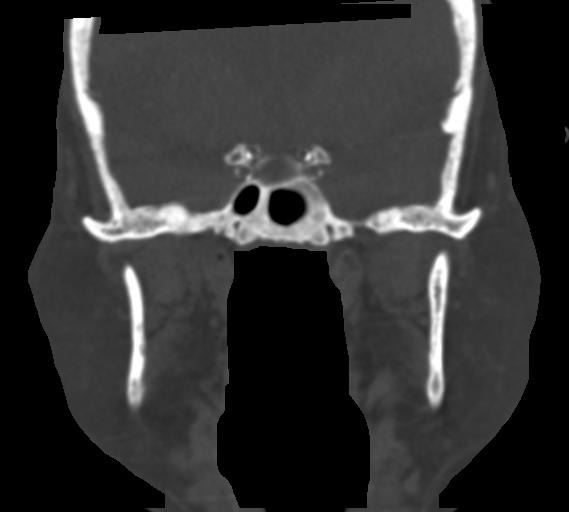

[Series 9: sagittal bone · sagittal · 0.33mm/px · 1 of 67 slices shown]
[im 34/67  bone]
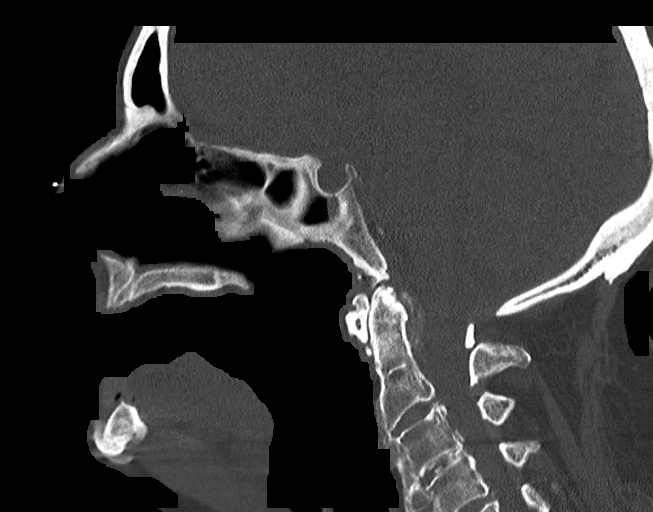

[16 of 47 positions shown; findings below may reference images not displayed]

FINDINGS: Osseous: No facial fracture.

Dental: The patient is edentulous.

Orbits: The globes are intact. Normal appearance of the intra- and
extraconal fat. Symmetric extraocular muscles.

Sinuses: No fluid levels or advanced mucosal thickening.

Soft tissues: Normal visualized extracranial soft tissues.

Limited intracranial: Normal.
IMPRESSION: Clear sinuses.  No specific finding to explain facial pain.
